# Patient Record
Sex: Male | Born: 1955 | Race: White | Hispanic: No | Marital: Single | Smoking: Never smoker
Health system: Southern US, Community
[De-identification: ages and names within clinical notes are randomized; demographics above are authoritative.]

## PROBLEM LIST (undated history)

## (undated) DIAGNOSIS — T8859XA Other complications of anesthesia, initial encounter: Secondary | ICD-10-CM

## (undated) DIAGNOSIS — N400 Enlarged prostate without lower urinary tract symptoms: Secondary | ICD-10-CM

## (undated) DIAGNOSIS — K635 Polyp of colon: Secondary | ICD-10-CM

## (undated) DIAGNOSIS — N529 Male erectile dysfunction, unspecified: Secondary | ICD-10-CM

## (undated) DIAGNOSIS — T4145XA Adverse effect of unspecified anesthetic, initial encounter: Secondary | ICD-10-CM

## (undated) DIAGNOSIS — M1990 Unspecified osteoarthritis, unspecified site: Secondary | ICD-10-CM

## (undated) DIAGNOSIS — Z87442 Personal history of urinary calculi: Secondary | ICD-10-CM

## (undated) DIAGNOSIS — K219 Gastro-esophageal reflux disease without esophagitis: Secondary | ICD-10-CM

## (undated) DIAGNOSIS — E7800 Pure hypercholesterolemia, unspecified: Secondary | ICD-10-CM

## (undated) DIAGNOSIS — N401 Enlarged prostate with lower urinary tract symptoms: Secondary | ICD-10-CM

## (undated) HISTORY — PX: HERNIA REPAIR: SHX51

## (undated) HISTORY — PX: JOINT REPLACEMENT: SHX530

## (undated) HISTORY — DX: Benign prostatic hyperplasia without lower urinary tract symptoms: N40.0

## (undated) HISTORY — DX: Pure hypercholesterolemia, unspecified: E78.00

## (undated) HISTORY — DX: Polyp of colon: K63.5

## (undated) HISTORY — DX: Male erectile dysfunction, unspecified: N52.9

## (undated) HISTORY — DX: Benign prostatic hyperplasia with lower urinary tract symptoms: N40.1

## (undated) HISTORY — DX: Unspecified osteoarthritis, unspecified site: M19.90

---

## 2005-03-16 ENCOUNTER — Ambulatory Visit: Payer: Self-pay | Admitting: General Practice

## 2007-05-12 HISTORY — PX: SHOULDER SURGERY: SHX246

## 2007-12-10 LAB — HM COLONOSCOPY: HM Colonoscopy: ABNORMAL

## 2008-04-02 ENCOUNTER — Ambulatory Visit: Payer: Self-pay | Admitting: Gastroenterology

## 2008-04-13 ENCOUNTER — Ambulatory Visit: Payer: Self-pay | Admitting: General Practice

## 2008-04-23 ENCOUNTER — Ambulatory Visit: Payer: Self-pay | Admitting: General Practice

## 2008-10-30 ENCOUNTER — Ambulatory Visit: Payer: Self-pay | Admitting: Family Medicine

## 2008-10-30 DIAGNOSIS — E7800 Pure hypercholesterolemia, unspecified: Secondary | ICD-10-CM | POA: Insufficient documentation

## 2008-10-30 DIAGNOSIS — D126 Benign neoplasm of colon, unspecified: Secondary | ICD-10-CM | POA: Insufficient documentation

## 2008-10-30 DIAGNOSIS — M169 Osteoarthritis of hip, unspecified: Secondary | ICD-10-CM

## 2008-10-30 DIAGNOSIS — N200 Calculus of kidney: Secondary | ICD-10-CM | POA: Insufficient documentation

## 2008-10-30 DIAGNOSIS — M1610 Unilateral primary osteoarthritis, unspecified hip: Secondary | ICD-10-CM | POA: Insufficient documentation

## 2008-11-01 ENCOUNTER — Telehealth: Payer: Self-pay | Admitting: Family Medicine

## 2009-01-29 ENCOUNTER — Encounter: Payer: Self-pay | Admitting: Family Medicine

## 2009-02-13 ENCOUNTER — Ambulatory Visit: Payer: Self-pay | Admitting: Family Medicine

## 2009-02-13 DIAGNOSIS — M2170 Unequal limb length (acquired), unspecified site: Secondary | ICD-10-CM | POA: Insufficient documentation

## 2009-02-13 DIAGNOSIS — G4730 Sleep apnea, unspecified: Secondary | ICD-10-CM | POA: Insufficient documentation

## 2009-02-13 DIAGNOSIS — N529 Male erectile dysfunction, unspecified: Secondary | ICD-10-CM

## 2009-02-13 HISTORY — DX: Sleep apnea, unspecified: G47.30

## 2009-02-17 DIAGNOSIS — J309 Allergic rhinitis, unspecified: Secondary | ICD-10-CM | POA: Insufficient documentation

## 2009-02-18 LAB — CONVERTED CEMR LAB
ALT: 31 units/L (ref 0–53)
AST: 26 units/L (ref 0–37)
Alkaline Phosphatase: 59 units/L (ref 39–117)
BUN: 12 mg/dL (ref 6–23)
Basophils Absolute: 0 10*3/uL (ref 0.0–0.1)
Calcium: 9.8 mg/dL (ref 8.4–10.5)
Eosinophils Relative: 1.2 % (ref 0.0–5.0)
GFR calc non Af Amer: 94.05 mL/min (ref >60)
HCT: 47 % (ref 39.0–52.0)
HDL: 41.4 mg/dL (ref >39.00)
Hemoglobin: 16.2 g/dL (ref 13.0–17.0)
LDL Cholesterol: 104 mg/dL — ABNORMAL HIGH (ref 0–99)
Lymphocytes Relative: 30.6 % (ref 12.0–46.0)
Lymphs Abs: 1.4 10*3/uL (ref 0.7–4.0)
Monocytes Relative: 9.8 % (ref 3.0–12.0)
PSA: 0.74 ng/mL (ref 0.10–4.00)
Platelets: 167 10*3/uL (ref 150.0–400.0)
Potassium: 4.5 meq/L (ref 3.5–5.1)
RDW: 12.6 % (ref 11.5–14.6)
Sodium: 141 meq/L (ref 135–145)
Total Bilirubin: 1 mg/dL (ref 0.3–1.2)
VLDL: 12.6 mg/dL (ref 0.0–40.0)
WBC: 4.7 10*3/uL (ref 4.5–10.5)

## 2009-04-19 ENCOUNTER — Ambulatory Visit: Payer: Self-pay | Admitting: Family Medicine

## 2009-04-24 ENCOUNTER — Ambulatory Visit: Payer: Self-pay | Admitting: Family Medicine

## 2009-06-07 ENCOUNTER — Telehealth: Payer: Self-pay | Admitting: Family Medicine

## 2009-06-17 ENCOUNTER — Ambulatory Visit: Payer: Self-pay | Admitting: Family Medicine

## 2009-09-08 HISTORY — PX: TOTAL HIP ARTHROPLASTY: SHX124

## 2009-09-09 ENCOUNTER — Ambulatory Visit: Payer: Self-pay | Admitting: General Practice

## 2009-09-16 ENCOUNTER — Telehealth: Payer: Self-pay | Admitting: Family Medicine

## 2009-09-23 ENCOUNTER — Inpatient Hospital Stay: Payer: Self-pay | Admitting: General Practice

## 2009-10-14 ENCOUNTER — Telehealth: Payer: Self-pay | Admitting: Family Medicine

## 2010-03-12 ENCOUNTER — Ambulatory Visit: Payer: Self-pay | Admitting: Family Medicine

## 2010-03-12 DIAGNOSIS — R5383 Other fatigue: Secondary | ICD-10-CM

## 2010-03-12 DIAGNOSIS — R5381 Other malaise: Secondary | ICD-10-CM

## 2010-03-17 LAB — CONVERTED CEMR LAB
ALT: 28 units/L (ref 0–53)
AST: 26 units/L (ref 0–37)
Albumin: 4.4 g/dL (ref 3.5–5.2)
Alkaline Phosphatase: 61 units/L (ref 39–117)
Basophils Absolute: 0 10*3/uL (ref 0.0–0.1)
Calcium: 9.6 mg/dL (ref 8.4–10.5)
Eosinophils Relative: 1.8 % (ref 0.0–5.0)
GFR calc non Af Amer: 92.48 mL/min (ref >60)
Glucose, Bld: 85 mg/dL (ref 70–99)
HCT: 44.7 % (ref 39.0–52.0)
HDL: 54.1 mg/dL (ref >39.00)
Hemoglobin: 15.3 g/dL (ref 13.0–17.0)
LDL Cholesterol: 114 mg/dL — ABNORMAL HIGH (ref 0–99)
Lymphocytes Relative: 31.2 % (ref 12.0–46.0)
Lymphs Abs: 1.6 10*3/uL (ref 0.7–4.0)
Monocytes Relative: 9.3 % (ref 3.0–12.0)
Neutro Abs: 2.9 10*3/uL (ref 1.4–7.7)
Potassium: 4.7 meq/L (ref 3.5–5.1)
RBC: 4.66 M/uL (ref 4.22–5.81)
RDW: 14.8 % — ABNORMAL HIGH (ref 11.5–14.6)
Sodium: 142 meq/L (ref 135–145)
TSH: 1.23 microintl units/mL (ref 0.35–5.50)
Total CHOL/HDL Ratio: 3
VLDL: 10 mg/dL (ref 0.0–40.0)
WBC: 5.1 10*3/uL (ref 4.5–10.5)

## 2010-06-10 NOTE — Progress Notes (Signed)
Summary: ? UTI  Phone Note Call from Patient   Caller: Patient Call For: Hannah Beat MD Summary of Call: Pt had a total hip replacement last month and since then has felt like he had a UTI- pain and burning  with urination.  He had these same sxs prior to his surgery and was treated with an antibiotic.  He doesnt know if what he is feeling is irritation from his catheter that was used during surgery or from an infection.  Advised him he will need office visit to check, but he said he might try to get in with a urologist.  If not, he will call back for an appt on thursday with either you or Dr. Hetty Ely. Initial call taken by: Lowella Petties CMA,  October 14, 2009 5:11 PM  Follow-up for Phone Call        reasonable POC Follow-up by: Hannah Beat MD,  October 14, 2009 5:33 PM

## 2010-06-10 NOTE — Assessment & Plan Note (Signed)
Summary: CPX/CLE   Vital Signs:  Patient profile:   55 year old male Height:      73 inches Weight:      221.0 pounds BMI:     29.26 Temp:     98.5 degrees F oral Pulse rate:   72 / minute Pulse rhythm:   regular BP sitting:   120 / 84  (left arm) Cuff size:   large  Vitals Entered By: Benny Lennert CMA [facility]) (March 12, 2010 8:49 AM)  History of Present Illness: Chief complaint cpx  55 year old male:  R THA over the summer  3 days a week Hip flexor, hip abductors, light dumbbells, reverse curlse  riding the stationary bike.   doing well.  Preventive Screening-Counseling & Management  Alcohol-Tobacco     Alcohol drinks/day: <1     Alcohol Counseling: not indicated; use of alcohol is not excessive or problematic     Smoking Status: never     Tobacco Counseling: not indicated; no tobacco use  Caffeine-Diet-Exercise     Caffeine use/day: coffee     Caffeine Counseling: not indicated; caffeine use is not excessive or problematic     Diet Comments: Good     Diet Counseling: to improve diet; diet is suboptimal     Does Patient Exercise: yes     Type of exercise: Some aerobic and weights as tolerated with severe OA, exercises many days, but recently none in past few weeks     Exercise (avg: min/session): 30-60     Times/week: 3     Exercise Counseling: to improve exercise regimen  Hep-HIV-STD-Contraception     Hepatitis Risk: no risk noted     HIV Risk: no risk noted     STD Risk: no risk noted     Contraception Counseling: not indicated; no questions/concerns expressed     Dental Visit-last 6 months yes     Dental Care Counseling: not indicated; dental care within six months     Testicular SE Education/Counseling to perform regular STE  Safety-Violence-Falls     Seat Belt Use: yes     Fall Risk Counseling: not indicated; no significant falls noted  Clinical Review Panels:  Prevention   Last Colonoscopy:  abnormal (12/10/2007)   Last PSA:  0.74  (02/13/2009)  Immunizations   Last Tetanus Booster:  given (02/13/2009)   Last Flu Vaccine:  given (02/08/2009)  Lipid Management   Cholesterol:  158 (02/13/2009)   LDL (bad choesterol):  104 (02/13/2009)   HDL (good cholesterol):  41.40 (02/13/2009)  Diabetes Management   Creatinine:  0.9 (02/13/2009)   Last Flu Vaccine:  given (02/08/2009)  CBC   WBC:  4.7 (02/13/2009)   RBC:  4.92 (02/13/2009)   Hgb:  16.2 (02/13/2009)   Hct:  47.0 (02/13/2009)   Platelets:  167.0 (02/13/2009)   MCV  95.4 (02/13/2009)   MCHC  34.4 (02/13/2009)   RDW  12.6 (02/13/2009)   PMN:  58.1 (02/13/2009)   Lymphs:  30.6 (02/13/2009)   Monos:  9.8 (02/13/2009)   Eosinophils:  1.2 (02/13/2009)   Basophil:  0.3 (02/13/2009)  Complete Metabolic Panel   Glucose:  91 (02/13/2009)   Sodium:  141 (02/13/2009)   Potassium:  4.5 (02/13/2009)   Chloride:  102 (02/13/2009)   CO2:  30 (02/13/2009)   BUN:  12 (02/13/2009)   Creatinine:  0.9 (02/13/2009)   Albumin:  4.3 (02/13/2009)   Total Protein:  7.6 (02/13/2009)   Calcium:  9.8 (02/13/2009)  Total Bili:  1.0 (02/13/2009)   Alk Phos:  59 (02/13/2009)   SGPT (ALT):  31 (02/13/2009)   SGOT (AST):  26 (02/13/2009)   Allergies (verified): No Known Drug Allergies  Past History:  Past medical, surgical, family and social histories (including risk factors) reviewed, and no changes noted (except as noted below).  Past Medical History: Reviewed history from 02/13/2009 and no changes required. 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 Family History of Prostate CA 1st degree relative (Father)  Mother alive, healthy Sister, migraines  Social  History: Reviewed history from 10/30/2008 and no changes required. Occupation: [employer], Emergency planning/management officer, Runner, broadcasting/film/video, Bachelor Never Smoked Alcohol use-yes Drug use-no Regular exercise-yes  Review of Systems  General: Denies fever, chills, sweats, anorexia, fatigue, weakness, malaise Eyes: Denies blurring, vision loss ENT: Denies earache, nasal congestion, nosebleeds, sore throat, and hoarseness.  Cardiovascular: Denies chest pains, palpitations, syncope, dyspnea on exertion,  Respiratory: Denies cough, dyspnea at rest, excessive sputum,wheeezing GI: Denies nausea, vomiting, diarrhea, constipation, change in bowel habits, abdominal pain, melena, hematochezia GU: OCC INCREASED URINATION Musculoskeletal: Denies back pain, joint pain Derm: Denies rash, itching Neuro: Denies  paresthesias, frequent falls, frequent headaches, and difficulty walking.  Psych: HAD SOME DEP POST HIP REPLACEMENT, now resolved Endocrine: Denies cold intolerance, heat intolerance, polydipsia, polyphagia, polyuria, and unusual weight change.  Heme: Denies enlarged lymph nodes Allergy: No hayfever   Otherwise, the pertinent positives and negatives are listed above and in the HPI, otherwise a full review of systems has been reviewed and is negative unless noted positive.    Impression & Recommendations:  Problem # 1:  HEALTH MAINTENANCE EXAM (ICD-V70.0) The patient's preventative maintenance and recommended screening tests for an annual wellness exam were reviewed in full today. Brought up to date unless services declined.  Counselled on the importance of diet, exercise, and its role in overall health and mortality. The patient's FH and SH was reviewed, including their home life, tobacco status, and drug and alcohol status.   doing well and up to date has had flu shot encourage exercise  Complete Medication List: 1)  Fish Oil 1000 Mg Caps (Omega-3 fatty acids) .... 2 tabs by mouth daily 2)   Vitamin C Cr 500 Mg Cr-caps (Ascorbic acid) .... 2 tabs by mouth daily 3)  Super B Complex Tabs (B complex-c) .... Two tabs a day 4)  Multivitamins Tabs (Multiple vitamin) .... Two tabs a day 5)  Vitamin D 1000 Unit Tabs (Cholecalciferol) .... Two tabs a day 6)  Saw Palmetto Supplement  .Marland KitchenMarland Kitchen. 1 by mouth two times a day 7)  Aspirin 81 Mg Tbec (Aspirin) .Marland Kitchen.. 1 by mouth qday 8)  Simvastatin 40 Mg Tabs (Simvastatin) .Marland Kitchen.. 1 by mouth at bedtime 9)  Red Wine Extract Plus Caps (Misc natural products) .... One a day 10)  Fluticasone Propionate 50 Mcg/act Susp (Fluticasone propionate) .... 2 sprays each nostril once daily 11)  Fluocinonide 0.05 % Crea (Fluocinonide) .... Apply as directed 12)  Levitra 20 Mg Tabs (Vardenafil hcl) .Marland Kitchen.. 1 by mouth prior to intercourse 13)  Glucosamine-chondroitin Caps (Glucosamine-chondroit-vit c-mn) .... Two caps by mouth daily 14)  Melatonin Pt Not Sure of Mg  .... One at bedtime as needed  Other Orders: Venipuncture (16109) TLB-Lipid Panel (80061-LIPID)  TLB-BMP (Basic Metabolic Panel-BMET) (80048-METABOL) TLB-CBC Platelet - w/Differential (85025-CBCD) TLB-Hepatic/Liver Function Pnl (80076-HEPATIC) TLB-TSH (Thyroid Stimulating Hormone) (84443-TSH) TLB-PSA (Prostate Specific Antigen) (84153-PSA) Prescriptions: FLUOCINONIDE 0.05 % CREA (FLUOCINONIDE) Apply as directed  #60 grams x 3   Entered and Authorized by:   Hannah Beat MD   Signed by:   Hannah Beat MD on 03/12/2010   Method used:   Print then Give to Patient   RxID:   [id number] LEVITRA 20 MG TABS (VARDENAFIL HCL) 1 by mouth prior to intercourse  #10 x 11   Entered and Authorized by:   Hannah Beat MD   Signed by:   Hannah Beat MD on 03/12/2010   Method used:   Print then Give to Patient   RxID:   [id number] FLUTICASONE PROPIONATE 50 MCG/ACT  SUSP (FLUTICASONE PROPIONATE) 2 sprays each nostril once daily  #1 vial x 11   Entered and Authorized by:   Hannah Beat MD   Signed by:    Hannah Beat MD on 03/12/2010   Method used:   Print then Give to Patient   RxID:   [id number] SIMVASTATIN 40 MG TABS (SIMVASTATIN) 1 by mouth at bedtime  #30 x 11   Entered and Authorized by:   Hannah Beat MD   Signed by:   Hannah Beat MD on 03/12/2010   Method used:   Print then Give to Patient   RxID:   [id number]    Orders Added: 1)  Venipuncture [09326] 2)  TLB-Lipid Panel [80061-LIPID] 3)  TLB-BMP (Basic Metabolic Panel-BMET) [80048-METABOL] 4)  TLB-CBC Platelet - w/Differential [85025-CBCD] 5)  TLB-Hepatic/Liver Function Pnl [80076-HEPATIC] 6)  TLB-TSH (Thyroid Stimulating Hormone) [84443-TSH] 7)  TLB-PSA (Prostate Specific Antigen) [84153-PSA] 8)  Est. Patient 40-64 years [71245]    Current Allergies (reviewed today): No known allergies   Physical Exam General Appearance: well developed, well nourished, no acute distress Eyes: conjunctiva and lids normal, PERRLA, EOMI Ears, Nose, Mouth, Throat: TM clear, nares clear, oral exam WNL Neck: supple, no lymphadenopathy, no thyromegaly, no JVD Respiratory: clear to auscultation and percussion, respiratory effort normal Cardiovascular: regular rate and rhythm, S1-S2, no murmur, rub or gallop, no bruits, peripheral pulses normal and symmetric, no cyanosis, clubbing, edema or varicosities Chest: no scars, masses, tenderness; no asymmetry, skin changes, nipple discharge, no gynecomastia   Gastrointestinal: soft, non-tender; no hepatosplenomegaly, masses; active bowel sounds all quadrants, no masses, tenderness, hemorrhoids  Genitourinary: no hernia, testicular mass, penile discharge, priapism or prostate enlargement Lymphatic: no cervical, axillary or inguinal adenopathy Musculoskeletal: gait normal, muscle tone and strength WNL, no joint swelling, effusions, discoloration, crepitus  Skin: clear, good turgor, color WNL, no rashes, lesions, or ulcerations Neurologic: normal mental status, normal reflexes,  normal strength, sensation, and motion Psychiatric: alert; oriented to person, place and time Other Exam:

## 2010-06-10 NOTE — Progress Notes (Signed)
Summary: regarding surgical clearance  Phone Note Call from Patient Call back at Work Phone [phone]   Caller: Patient Call For: Hannah Beat MD Summary of Call: Pt will be having hip replacement surgery in the summer and he says he will need a letter of surgical clearance.  I advised him he will probably need an office visit to get an EKG, but I told him I would ask you. Initial call taken by: Lowella Petties CMA,  June 07, 2009 4:22 PM  Follow-up for Phone Call        Yes, will need a 30 minute preoperative clearance consult office visit. I would schedule in spring if hip replacement not until summer.  please assist in setting. Follow-up by: Hannah Beat MD,  June 08, 2009 5:05 PM  Additional Follow-up for Phone Call Additional follow up Details #1::        Left message for patient to call back. Lewanda Rife LPN  June 12, 2009 9:53 AM   Spoke with patient, he wants to come in sooner for his surgery clearence appt, spoke with Dr. Patsy Lager and he said it was ok for him to come in sooner, says patient is a busy man.  Scheduled him for 06/17/2009 at 3:30pm.  Additional Follow-up by: Linde Gillis CMA [facility]),  June 13, 2009 11:10 AM

## 2010-06-10 NOTE — Assessment & Plan Note (Signed)
Summary: 30 minute appt, surgery clearence/nt   Vital Signs:  Patient profile:   55 year old male Weight:      217.50 pounds BMI:     28.80 Temp:     98.3 degrees F oral Pulse rate:   72 / minute Pulse rhythm:   regular BP sitting:   120 / 86  (right arm) Cuff size:   large  Vitals Entered By: Linde Gillis CMA [facility]) (June 17, 2009 3:31 PM) CC: 30 minute exam, surgery clearance   History of Present Illness: 55 year old male seen in consultation for Dr. Milinda Antis for medical clearance for R THR.  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 He is able to achieve a 4 met equivalent without any difficulty from a CP or SOB standpoint and his limiting factor for exercise is his hip pain.  He feels well and had a normal CPX and labwork in the fall.  He is scheduled for a THR on 09/23/2009.     Clinical Review Panels:  Prevention   Last Colonoscopy:  abnormal (12/10/2007)   Last PSA:  0.74 (02/13/2009)  Immunizations   Last Tetanus Booster:  given (02/13/2009)   Last Flu Vaccine:  given (02/08/2009)  Lipid Management   Cholesterol:  158 (02/13/2009)   LDL (bad choesterol):  104 (02/13/2009)   HDL (good cholesterol):  41.40 (02/13/2009)  Diabetes Management   Creatinine:  0.9 (02/13/2009)   Last Flu Vaccine:  given (02/08/2009)  CBC   WBC:  4.7 (02/13/2009)   RBC:  4.92 (02/13/2009)   Hgb:  16.2 (02/13/2009)   Hct:  47.0 (02/13/2009)   Platelets:  167.0 (02/13/2009)   MCV  95.4 (02/13/2009)   MCHC  34.4 (02/13/2009)   RDW  12.6 (02/13/2009)   PMN:  58.1 (02/13/2009)   Lymphs:  30.6 (02/13/2009)   Monos:  9.8 (02/13/2009)   Eosinophils:  1.2 (02/13/2009)   Basophil:  0.3 (02/13/2009)  Complete Metabolic Panel   Glucose:  91 (02/13/2009)   Sodium:  141 (02/13/2009)   Potassium:  4.5 (02/13/2009)   Chloride:  102  (02/13/2009)   CO2:  30 (02/13/2009)   BUN:  12 (02/13/2009)   Creatinine:  0.9 (02/13/2009)   Albumin:  4.3 (02/13/2009)   Total Protein:  7.6 (02/13/2009)   Calcium:  9.8 (02/13/2009)   Total Bili:  1.0 (02/13/2009)   Alk Phos:  59 (02/13/2009)   SGPT (ALT):  31 (02/13/2009)   SGOT (AST):  26 (02/13/2009)   Current Problems (verified): 1)  Pre-operative Cardiovascular Examination  (ICD-V72.81) 2)  Unequal Leg Length  (ICD-736.81) 3)  Erectile Dysfunction, Organic  (ICD-607.84) 4)  Sleep Apnea  (ICD-780.57) 5)  Health Maintenance Exam  (ICD-V70.0) 6)  Allergic Rhinitis  (ICD-477.9) 7)  Encounter For Long-term Use of Other Medications  (ICD-V58.69) 8)  Special Screening Malignant Neoplasm of Prostate  (ICD-V76.44) 9)  Degenerative Joint Disease, Hips  (ICD-715.95) 10)  Colonic Polyps  (ICD-211.3) 11)  Nephrolithiasis  (ICD-592.0) 12)  Hypercholesterolemia  (ICD-272.0)  Allergies (verified): No Known Drug Allergies  Past History:  Past medical, surgical, family and social histories (including risk factors) reviewed, and no changes noted (except as noted below).  Past Medical History: Reviewed history from 02/13/2009 and no changes required. COLONIC POLYPS  NEPHROLITHIASIS HYPERCHOLESTEROLEMIA ARTHRITIS, severe R > L hip Erectile Dysfunction Allergic rhinitis Moderately enlarged prostate  Past Surgical History: Reviewed history from 10/30/2008 and no changes required. L shoulder, open, 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, 2009 Milinda Antis)  Family History: Reviewed history from 10/30/2008 and no changes required. Family History of Prostate CA 1st degree relative (Father)  Mother alive, healthy Sister, migraines  Social History: Reviewed history from 10/30/2008 and no changes required. 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  General:  Denies chills, fatigue, and fever. CV:  Denies chest pain or discomfort, difficulty breathing at night, difficulty breathing while lying down, palpitations, shortness of breath with exertion, and swelling of feet. Resp:  Denies cough and shortness of breath. MS:  Complains of joint pain, muscle weakness, and stiffness. Neuro:  Denies tingling.  Physical Exam  General:  Well-developed,well-nourished,in no acute distress; alert,appropriate and cooperative throughout examination Head:  Normocephalic and atraumatic without obvious abnormalities. No apparent alopecia or balding. Eyes:  vision grossly intact.   Ears:  no external deformities.   Nose:  no external deformity.   Mouth:  Oral mucosa and oropharynx without lesions or exudates.  Teeth in good repair. Neck:  No deformities, masses, or tenderness noted. Lungs:  Normal respiratory effort, chest expands symmetrically. Lungs are clear to auscultation, no crackles or wheezes. Heart:  Normal rate and regular rhythm. S1 and S2 normal without gallop, murmur, click, rub or other extra sounds. Extremities:  no c/c/e Neurologic:  alert & oriented X3.   Cervical Nodes:  No lymphadenopathy noted Psych:  Cognition and judgment appear intact. Alert and cooperative with normal attention span and concentration. No apparent delusions, illusions, hallucinations   Impression & Recommendations:  Problem # 1:  PRE-OPERATIVE CARDIOVASCULAR EXAMINATION (ICD-V72.81) Following AHA / ACC criteria, patient does not need further cardiac work-up prior to [facility]  EKG: Normal sinus rhythm. Normal axis, normal R wave progression, No acute ST elevation or depression. t wave inv avf, nonspecific and grossly normal ekg  All most relevant labs are included. If further laboratories needed for anesthesia  directly before surgery, they can easily be obtained the month prior to surgery.  Given overall current limitations with severe hip OA, benefits outweigh risks of surgery, and proceeding with TKR is reasonable.  cc: Dr. Milinda Antis  Problem # 2:  HYPERCHOLESTEROLEMIA (ICD-272.0)  His updated medication list for this problem includes:    Simvastatin 40 Mg Tabs (Simvastatin) .Marland Kitchen... 1 by mouth at bedtime  Problem # 3:  SLEEP APNEA (ICD-780.57)  Complete Medication List: 1)  Fish Oil 1000 Mg Caps (Omega-3 fatty acids) .... 2 tabs by mouth daily 2)  Vitamin C Cr 500 Mg Cr-caps (Ascorbic acid) .... 2 tabs by mouth daily 3)  Super B Complex Tabs (B complex-c) .... Two tabs a day 4)  Multivitamins Tabs (Multiple vitamin) .... Two tabs a day 5)  Vitamin D 1000 Unit Tabs (Cholecalciferol) .... Two tabs a day 6)  Saw Palmetto Supplement  .Marland KitchenMarland Kitchen. 1 by mouth two times a day 7)  Aspirin 81 Mg Tbec (Aspirin) .Marland Kitchen.. 1 by mouth qday 8)  Simvastatin 40 Mg Tabs (Simvastatin) .Marland Kitchen.. 1 by mouth at bedtime 9)  Red Wine Extract Plus Caps (Misc natural products) .... One a day 10)  Fluticasone Propionate 50  Mcg/act Susp (Fluticasone propionate) .... 2 sprays each nostril once daily 11)  Fluocinonide 0.05 % Crea (Fluocinonide) .... Apply as directed 12)  Levitra 20 Mg Tabs (Vardenafil hcl) .Marland Kitchen.. 1 by mouth prior to intercourse 13)  Glucosamine-chondroitin Caps (Glucosamine-chondroit-vit c-mn) .... Two caps by mouth daily 14)  Melatonin Pt Not Sure of Mg  .... One at bedtime as needed  Current Allergies (reviewed today): No known allergies

## 2010-06-10 NOTE — Progress Notes (Signed)
Summary: UTI  Phone Note Call from Patient Call back at Home Phone [phone] Call back at [phone]   Caller: Patient Call For: Hannah Beat MD Summary of Call: Patient called to schedule appt. None of the providers have anything available for today. He says that he has a UTI. He is having burning while urinating and frequency. He is going to bathroom every 45 min, but can't empty out. He is having him replacement on the 16th and needs this resolved before then. He wants to know if he can have something called in to [facility] on [address] in [place]. Please advise.  Initial call taken by: Melody Comas,  Sep 16, 2009 11:52 AM  Follow-up for Phone Call        reasonable -- we have no space Follow-up by: Hannah Beat MD,  Sep 16, 2009 12:20 PM    New/Updated Medications: CIPROFLOXACIN HCL 500 MG TABS (CIPROFLOXACIN HCL) 1 by mouth two times a day Prescriptions: CIPROFLOXACIN HCL 500 MG TABS (CIPROFLOXACIN HCL) 1 by mouth two times a day  #14 x 0   Entered by:   Benny Lennert CMA (AAMA)   Authorized by:   Hannah Beat MD   Signed by:   Benny Lennert CMA (AAMA) on 09/16/2009   Method used:   Electronically to        [facility] [address] [phone].* (retail)       [address]       [place], [place]  [address]       Ph: [phone]       Fax: [phone]   RxID:   [id number] CIPROFLOXACIN HCL 500 MG TABS (CIPROFLOXACIN HCL) 1 by mouth two times a day  #14 x 0   Entered and Authorized by:   Hannah Beat MD   Signed by:   Hannah Beat MD on 09/16/2009   Method used:   Telephoned to ...         RxID:   [id number]

## 2010-06-17 ENCOUNTER — Encounter: Payer: Self-pay | Admitting: Family Medicine

## 2010-06-17 ENCOUNTER — Ambulatory Visit (INDEPENDENT_AMBULATORY_CARE_PROVIDER_SITE_OTHER): Payer: BC Managed Care – PPO | Admitting: Family Medicine

## 2010-06-17 DIAGNOSIS — J069 Acute upper respiratory infection, unspecified: Secondary | ICD-10-CM

## 2010-06-18 ENCOUNTER — Ambulatory Visit: Payer: Self-pay | Admitting: Family Medicine

## 2010-06-26 NOTE — Assessment & Plan Note (Signed)
Summary: SINUS INFECTION   Vital Signs:  Patient profile:   55 year old male Weight:      227 pounds O2 Sat:      97 % on Room air Temp:     98.5 degrees F oral Pulse rate:   74 / minute Pulse rhythm:   regular BP sitting:   112 / 80  (left arm) Cuff size:   large  Vitals Entered By: Selena Batten Dance CMA (AAMA) (June 17, 2010 12:30 PM)  O2 Flow:  Room air CC: ? Sinus infection   History of Present Illness: CC: sinus infx?  6d h/o feeling ill.  Started with stomach upset, weakness.  felt that way all weekend, stayed in bed.  + nausea.  Also with chills all weekend.  2 d ago started feeling head cold, feels like going into chest.  + coughing all night last night.  Feels lungs tight as well.  Not very much discharge from nose.  + achey all over.  + HA - bitemporal dull.  used cough syrup last night which didn't help.  + SOB  No abd pain, v/d, rashes, myalgias, arthralgias, ST.  No ear pain or tooth pain.  No CP.  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  no smokers at home.    No h/o asthma/allergies.  not using flonase as should.  Current Medications (verified): 1)  Fish Oil 1000 Mg Caps (Omega-3 Fatty Acids) .... 2 Tabs By Mouth Daily 2)  Vitamin C Cr 500 Mg Cr-Caps (Ascorbic Acid) .... 2 Tabs By Mouth Daily 3)  Super B Complex  Tabs (B Complex-C) .... Two Tabs A Day 4)  Multivitamins  Tabs (Multiple Vitamin) .... Two Tabs A Day 5)  Vitamin D 1000 Unit Tabs (Cholecalciferol) .... Two Tabs A Day 6)  Saw Palmetto Supplement .Marland Kitchen.. 1 By Mouth Two Times A Day 7)  Aspirin 81 Mg Tbec (Aspirin) .Marland Kitchen.. 1 By Mouth Qday 8)  Simvastatin 40 Mg Tabs (Simvastatin) .Marland Kitchen.. 1 By Mouth At Bedtime 9)  Red Wine Extract Plus  Caps (Misc Natural Products) .... One A Day 10)  Fluticasone Propionate 50 Mcg/act  Susp (Fluticasone Propionate) .... 2 Sprays Each Nostril Once Daily 11)  Fluocinonide 0.05 % Crea (Fluocinonide) .... Apply As Directed 12)  Levitra 20 Mg Tabs (Vardenafil Hcl) .Marland Kitchen.. 1  By Mouth Prior To Intercourse 13)  Glucosamine-Chondroitin  Caps (Glucosamine-Chondroit-Vit C-Mn) .... Two Caps By Mouth Daily 14)  Melatonin Pt Not Sure of Mg .... One At Bedtime As Needed  Allergies (verified): No Known Drug Allergies  Past History:  Past Medical History: Last updated: 02/13/2009 COLONIC POLYPS  NEPHROLITHIASIS HYPERCHOLESTEROLEMIA ARTHRITIS, severe R > L hip Erectile Dysfunction Allergic rhinitis Moderately enlarged prostate  Social History: Last updated: 10/30/2008 Occupation: [employer], Emergency planning/management officer, head  Doctorate Single, Bachelor Never Smoked Alcohol use-yes Drug use-no Regular exercise-yes  Review of Systems       per HPI  Physical Exam  General:  Well-developed,well-nourished,in no acute distress; alert,appropriate and cooperative throughout examination Head:  Normocephalic and atraumatic without obvious abnormalities. No apparent alopecia or balding.  no sinus tenderness Eyes:  vision grossly intact.   Ears:  TMs clear bilaterally Nose:  nares clear Mouth:  Oral mucosa and oropharynx without lesions or exudates.  Teeth in good repair. Neck:  No deformities, masses, or tenderness noted.  no LAD Lungs:  Normal respiratory effort, chest expands symmetrically. Lungs are clear to auscultation, no crackles or wheezes. Heart:  Normal rate  and regular rhythm. S1 and S2 normal without gallop, murmur, click, rub or other extra sounds. Pulses:  2+ rad pulses, brisk cap refill Extremities:  no pedal edema Skin:  Intact without suspicious lesions or rashes   Impression & Recommendations:  Problem # 1:  VIRAL URI (ICD-465.9) supportive care as per instructions.  if not better in next few days consider zpack for bronchitis picture.  if worse, return for further evaluation.  His updated medication list for this problem includes:    Aspirin 81 Mg Tbec (Aspirin) .Marland Kitchen... 1 by mouth qday    Tussionex Pennkinetic Er 10-8 Mg/77ml Lqcr (Hydrocod  polst-chlorphen polst) ..... One teaspoon two times a day as needed cough, sedation precautions  Complete Medication List: 1)  Fish Oil 1000 Mg Caps (Omega-3 fatty acids) .... 2 tabs by mouth daily 2)  Vitamin C Cr 500 Mg Cr-caps (Ascorbic acid) .... 2 tabs by mouth daily 3)  Super B Complex Tabs (B complex-c) .... Two tabs a day 4)  Multivitamins Tabs (Multiple vitamin) .... Two tabs a day 5)  Vitamin D 1000 Unit Tabs (Cholecalciferol) .... Two tabs a day 6)  Saw Palmetto Supplement  .Marland KitchenMarland Kitchen. 1 by mouth two times a day 7)  Aspirin 81 Mg Tbec (Aspirin) .Marland Kitchen.. 1 by mouth qday 8)  Simvastatin 40 Mg Tabs (Simvastatin) .Marland Kitchen.. 1 by mouth at bedtime 9)  Red Wine Extract Plus Caps (Misc natural products) .... One a day 10)  Fluticasone Propionate 50 Mcg/act Susp (Fluticasone propionate) .... 2 sprays each nostril once daily 11)  Fluocinonide 0.05 % Crea (Fluocinonide) .... Apply as directed 12)  Levitra 20 Mg Tabs (Vardenafil hcl) .Marland Kitchen.. 1 by mouth prior to intercourse 13)  Glucosamine-chondroitin Caps (Glucosamine-chondroit-vit c-mn) .... Two caps by mouth daily 14)  Melatonin Pt Not Sure of Mg  .... One at bedtime as needed 15)  Tussionex Pennkinetic Er 10-8 Mg/24ml Lqcr (Hydrocod polst-chlorphen polst) .... One teaspoon two times a day as needed cough, sedation precautions  Patient Instructions: 1)  Sounds like you have a viral upper respiratory infection. 2)  Antibiotics are not needed for this.  Viral infections usually take 7-10 days to resolve.  The cough can last 4 weeks to go away. 3)  Use medication as prescribed: tussionex at night 4)  Push fluids and plenty of rest over next few days. 5)  Guaifenesin 400mg  IR 1 1/2 pills in am and at noon (or simple mucinex) with plenty of fluid to mobilize mucous. 6)  Please return if you are not improving as expected, or if you have high fevers (>101.5) or difficulty swallowing. 7)  Call us towards end of week if not feeling better. 8)  Call clinic with  questions.  Pleasure to see you today. Prescriptions: Sandria Senter ER 10-8 MG/5ML LQCR (HYDROCOD POLST-CHLORPHEN POLST) one teaspoon two times a day as needed cough, sedation precautions  #240cc x 0   Entered and Authorized by:   Eustaquio Boyden  MD   Signed by:   Eustaquio Boyden  MD on 06/17/2010   Method used:   Print then Give to Patient   RxID:   [id number]    Orders Added: 1)  Est. Patient Level III [14782]     Current Allergies (reviewed today): No known allergies

## 2010-08-05 ENCOUNTER — Encounter: Payer: Self-pay | Admitting: Family Medicine

## 2011-03-16 ENCOUNTER — Other Ambulatory Visit: Payer: Self-pay | Admitting: *Deleted

## 2011-03-16 MED ORDER — FLUTICASONE PROPIONATE 50 MCG/ACT NA SUSP: 2.0000 | Freq: Every day | NASAL | Status: DC

## 2011-03-18 ENCOUNTER — Ambulatory Visit (INDEPENDENT_AMBULATORY_CARE_PROVIDER_SITE_OTHER): Payer: BC Managed Care – PPO | Admitting: Family Medicine

## 2011-03-18 ENCOUNTER — Encounter: Payer: Self-pay | Admitting: Family Medicine

## 2011-03-18 VITALS — BP 116/80 | HR 72 | Temp 98.6°F | Ht 73.25 in | Wt 216.0 lb

## 2011-03-18 DIAGNOSIS — Z79899 Other long term (current) drug therapy: Secondary | ICD-10-CM

## 2011-03-18 DIAGNOSIS — Z0000 Encounter for general adult medical examination without abnormal findings: Secondary | ICD-10-CM

## 2011-03-18 DIAGNOSIS — E7800 Pure hypercholesterolemia, unspecified: Secondary | ICD-10-CM

## 2011-03-18 LAB — CBC WITH DIFFERENTIAL/PLATELET
Basophils Absolute: 0 10*3/uL (ref 0.0–0.1)
Basophils Relative: 0.4 % (ref 0.0–3.0)
Eosinophils Absolute: 0.1 10*3/uL (ref 0.0–0.7)
Lymphocytes Relative: 32.5 % (ref 12.0–46.0)
MCHC: 34.2 g/dL (ref 30.0–36.0)
MCV: 96.9 fl (ref 78.0–100.0)
Monocytes Absolute: 0.4 10*3/uL (ref 0.1–1.0)
Neutrophils Relative %: 57.3 % (ref 43.0–77.0)
Platelets: 167 10*3/uL (ref 150.0–400.0)
RDW: 13.5 % (ref 11.5–14.6)

## 2011-03-18 LAB — LIPID PANEL
HDL: 52.7 mg/dL (ref >39.00)
Total CHOL/HDL Ratio: 3
Triglycerides: 39 mg/dL (ref 0.0–149.0)
VLDL: 7.8 mg/dL (ref 0.0–40.0)

## 2011-03-18 LAB — BASIC METABOLIC PANEL
BUN: 12 mg/dL (ref 6–23)
CO2: 30 mEq/L (ref 19–32)
Calcium: 9.5 mg/dL (ref 8.4–10.5)
Chloride: 104 mEq/L (ref 96–112)
Creatinine, Ser: 1 mg/dL (ref 0.4–1.5)
Glucose, Bld: 91 mg/dL (ref 70–99)

## 2011-03-18 LAB — HEPATIC FUNCTION PANEL
Bilirubin, Direct: 0.1 mg/dL (ref 0.0–0.3)
Total Bilirubin: 0.8 mg/dL (ref 0.3–1.2)
Total Protein: 7.2 g/dL (ref 6.0–8.3)

## 2011-03-18 NOTE — Progress Notes (Signed)
  Subjective:    Patient ID: Joshua Lang, male    DOB: 03-28-1956, 54 y.o.   MRN: [id number]  HPI  Joshua Lang, a 55 y.o. male presents today in the office for the following:    CPX:  Having some prostatitis Had a hip replacement about a year ago or so. Had some depression afterwards.   Has been to Dr. Artis Flock for a couple of months Fish Oil and Saw Palmeto Questions about TURP. Prostate BPH management.   Cranberry juice.  Some soreness in your left hip.  Had a flu shot  Preventative Health Maintenance Visit:  Health Maintenance Summary Reviewed and updated, unless pt declines services.  Tobacco History Reviewed. Alcohol: No concerns, no excessive use Exercise Habits: Some activity, rec at least 30 mins 5 times a week - rare right now STD concerns: no risk or activity to increase risk Drug Use: None Encouraged self-testicular check  Health Maintenance  Topic Date Due  . Influenza Vaccine  02/09/2011  . Colonoscopy  12/09/2017  . Tetanus/tdap  02/14/2019    Review of Systems General: Denies fever, chills, sweats. No significant weight loss. Eyes: Denies blurring,significant itching ENT: Denies earache, sore throat, and hoarseness. Cardiovascular: Denies chest pains, palpitations, dyspnea on exertion Respiratory: Denies cough, dyspnea at rest,wheeezing Breast: no concerns about lumps GI: Denies nausea, vomiting, diarrhea, constipation, change in bowel habits, abdominal pain, melena, hematochezia GU: above Musculoskeletal: L HIP Pain Derm: Denies rash, itching Neuro: Denies  paresthesias, frequent falls, frequent headaches Psych: Denies depression, anxiety Endocrine: Denies cold intolerance, heat intolerance, polydipsia Heme: Denies enlarged lymph nodes Allergy: No hayfever      Objective:   Physical Exam   Physical Exam  Blood pressure 116/80, pulse 72, temperature 98.6 F (37 C), temperature source Oral, height 6' 1.25" (1.861 m), weight 216 lb (97.977  kg).  PE: GEN: well developed, well nourished, no acute distress Eyes: conjunctiva and lids normal, PERRLA, EOMI ENT: TM clear, nares clear, oral exam WNL Neck: supple, no lymphadenopathy, no thyromegaly, no JVD Pulm: clear to auscultation and percussion, respiratory effort normal CV: regular rate and rhythm, S1-S2, no murmur, rub or gallop, no bruits, peripheral pulses normal and symmetric, no cyanosis, clubbing, edema or varicosities Chest: no scars, masses, no gynecomastia   GI: soft, non-tender; no hepatosplenomegaly, masses; active bowel sounds all quadrants GU: no hernia, testicular mass, penile discharge. Defer checking prostate since done 1 month ago at Urology Lymph: no cervical, axillary or inguinal adenopathy MSK: gait normal, muscle tone and strength WNL, no joint swelling, effusions, discoloration, crepitus  SKIN: clear, good turgor, color WNL, no rashes, lesions, or ulcerations Neuro: normal mental status, normal strength, sensation, and motion Psych: alert; oriented to person, place and time, normally interactive and not anxious or depressed in appearance.       Assessment & Plan:   1. Routine general medical examination at a health care facility    2. HYPERCHOLESTEROLEMIA  Lipid panel  3. Encounter for long-term (current) use of other medications  Basic metabolic panel, CBC with Differential, Hepatic function panel    The patient's preventative maintenance and recommended screening tests for an annual wellness exam were reviewed in full today. Brought up to date unless services declined.  Counselled on the importance of diet, exercise, and its role in overall health and mortality. The patient's FH and SH was reviewed, including their home life, tobacco status, and drug and alcohol status.

## 2011-03-18 NOTE — Patient Instructions (Signed)
[facility]: Dr. Irineo Axon [facility]: Dr. Crecencio Mc or [place] McDiarmid

## 2011-03-19 ENCOUNTER — Other Ambulatory Visit: Payer: Self-pay | Admitting: *Deleted

## 2011-03-19 MED ORDER — SOLIFENACIN SUCCINATE 5 MG PO TABS: 5.0000 mg | ORAL_TABLET | Freq: Every day | ORAL | Status: DC

## 2011-03-19 MED ORDER — VARDENAFIL HCL 20 MG PO TABS: 20.0000 mg | ORAL_TABLET | Freq: Every day | ORAL | Status: DC | PRN

## 2011-03-19 MED ORDER — SIMVASTATIN 40 MG PO TABS: 40.0000 mg | ORAL_TABLET | Freq: Every day | ORAL | Status: DC

## 2011-03-20 ENCOUNTER — Telehealth: Payer: Self-pay | Admitting: Internal Medicine

## 2011-03-20 NOTE — Telephone Encounter (Signed)
Patient called and wanted to know if Dr. Patsy Lager received a fax from Dr. Amada Jupiter office.  He stated the office has faxed it over a few times.  Please let patient know if Dr. Patsy Lager has this fax.

## 2011-03-23 NOTE — Telephone Encounter (Signed)
I did. Dr. Artis Flock sent over his consult notes.  Let him know we got them

## 2011-03-24 NOTE — Telephone Encounter (Signed)
Patient advised.

## 2011-09-11 ENCOUNTER — Other Ambulatory Visit: Payer: Self-pay | Admitting: *Deleted

## 2011-09-11 MED ORDER — FLUOCINONIDE 0.05 % EX CREA: 1.0000 "application " | TOPICAL_CREAM | CUTANEOUS | Status: DC

## 2012-01-19 ENCOUNTER — Ambulatory Visit (INDEPENDENT_AMBULATORY_CARE_PROVIDER_SITE_OTHER): Payer: BC Managed Care – PPO | Admitting: Family Medicine

## 2012-01-19 ENCOUNTER — Encounter: Payer: Self-pay | Admitting: Family Medicine

## 2012-01-19 VITALS — BP 122/82 | HR 65 | Temp 97.9°F | Resp 15 | Wt 225.5 lb

## 2012-01-19 DIAGNOSIS — J069 Acute upper respiratory infection, unspecified: Secondary | ICD-10-CM | POA: Insufficient documentation

## 2012-01-19 MED ORDER — GUAIFENESIN-CODEINE 100-10 MG/5ML PO SYRP: 5.0000 mL | ORAL_SOLUTION | Freq: Every evening | ORAL | Status: DC | PRN

## 2012-01-19 MED ORDER — AZITHROMYCIN 250 MG PO TABS: ORAL_TABLET | ORAL | Status: AC

## 2012-01-19 NOTE — Assessment & Plan Note (Signed)
Discussed viral infection vs bacterial and timeline of viral infection. Antibiotics not indicated at this time. Treat with cough suppressant and mucinex. Continue nasal saline and flonase. Pt insistent that as in the past he thinks he will get worse without antibiotics. Prescription given to pt to use if not imrpoving. He is also concerned that azithromycin is not strong enough for him and that he will have to come back for another appt if he is not getting better.  I assured pt that as long as he is not having increase SOB, wheeze, fever on antibotics.. We would be able to try a second course of antibotics second line if not improving after 10 days following Z-pack.

## 2012-01-19 NOTE — Patient Instructions (Addendum)
Mucinex DM during the day.  At night for cough use prescription cough suppressant.  Nasal saline irrigation, continue flonase. Rest and fluids. If not improving as expected can fill antibiotics.

## 2012-01-19 NOTE — Progress Notes (Signed)
  Subjective:    Patient ID: Joshua Lang, male    DOB: 23-Oct-1955, 56 y.o.   MRN: [id number]  Cough This is a new problem. The current episode started in the past 7 days. The problem has been gradually worsening. The problem occurs constantly. The cough is non-productive (constant cough at night). Associated symptoms include chills, a fever, headaches, nasal congestion, rhinorrhea, a sore throat and shortness of breath. Pertinent negatives include no ear congestion, ear pain or wheezing. Associated symptoms comments: Chest congestion Some nausea and constipation. He has tried nothing for the symptoms. There is no history of asthma, COPD or emphysema. nonsmoker      Review of Systems  Constitutional: Positive for fever and chills.  HENT: Positive for sore throat and rhinorrhea. Negative for ear pain.   Respiratory: Positive for cough and shortness of breath. Negative for wheezing.   Neurological: Positive for headaches.       Objective:   Physical Exam  Constitutional: Vital signs are normal. He appears well-developed and well-nourished.  Non-toxic appearance. He does not appear ill. No distress.  HENT:  Head: Normocephalic and atraumatic.  Right Ear: Hearing, tympanic membrane, external ear and ear canal normal. No tenderness. No foreign bodies. Tympanic membrane is not retracted and not bulging.  Left Ear: Hearing, tympanic membrane, external ear and ear canal normal. No tenderness. No foreign bodies. Tympanic membrane is not retracted and not bulging.  Nose: Nose normal. No mucosal edema or rhinorrhea. Right sinus exhibits no maxillary sinus tenderness and no frontal sinus tenderness. Left sinus exhibits no maxillary sinus tenderness and no frontal sinus tenderness.  Mouth/Throat: Uvula is midline, oropharynx is clear and moist and mucous membranes are normal. Normal dentition. No dental caries. No oropharyngeal exudate or tonsillar abscesses.  Eyes: Conjunctivae, EOM and lids are normal.  Pupils are equal, round, and reactive to light. No foreign bodies found.  Neck: Trachea normal, normal range of motion and phonation normal. Neck supple. Carotid bruit is not present. No mass and no thyromegaly present.  Cardiovascular: Normal rate, regular rhythm, S1 normal, S2 normal, normal heart sounds, intact distal pulses and normal pulses.  Exam reveals no gallop.   No murmur heard. Pulmonary/Chest: Effort normal and breath sounds normal. No respiratory distress. He has no wheezes. He has no rhonchi. He has no rales.  Abdominal: Soft. Normal appearance and bowel sounds are normal. There is no hepatosplenomegaly. There is no tenderness. There is no rebound, no guarding and no CVA tenderness. No hernia.  Neurological: He is alert. He has normal reflexes.  Skin: Skin is warm, dry and intact. No rash noted.  Psychiatric: He has a normal mood and affect. His speech is normal and behavior is normal. Judgment normal.          Assessment & Plan:

## 2012-03-22 ENCOUNTER — Other Ambulatory Visit: Payer: Self-pay

## 2012-03-22 MED ORDER — SIMVASTATIN 40 MG PO TABS: 40.0000 mg | ORAL_TABLET | Freq: Every day | ORAL | Status: DC

## 2012-03-22 NOTE — Telephone Encounter (Signed)
Pt request refill simvastatin to [facility] [address]; pt has CPX scheduled 04/06/12.pt notified done.

## 2012-04-06 ENCOUNTER — Ambulatory Visit (INDEPENDENT_AMBULATORY_CARE_PROVIDER_SITE_OTHER): Payer: BC Managed Care – PPO | Admitting: Family Medicine

## 2012-04-06 ENCOUNTER — Encounter: Payer: Self-pay | Admitting: Family Medicine

## 2012-04-06 ENCOUNTER — Encounter: Payer: Self-pay | Admitting: *Deleted

## 2012-04-06 VITALS — BP 118/80 | HR 70 | Temp 98.2°F | Ht 73.25 in | Wt 225.8 lb

## 2012-04-06 DIAGNOSIS — Z131 Encounter for screening for diabetes mellitus: Secondary | ICD-10-CM

## 2012-04-06 DIAGNOSIS — Z125 Encounter for screening for malignant neoplasm of prostate: Secondary | ICD-10-CM

## 2012-04-06 DIAGNOSIS — Z0000 Encounter for general adult medical examination without abnormal findings: Secondary | ICD-10-CM

## 2012-04-06 DIAGNOSIS — Z13220 Encounter for screening for lipoid disorders: Secondary | ICD-10-CM

## 2012-04-06 LAB — PSA: PSA: 0.74 ng/mL (ref 0.10–4.00)

## 2012-04-06 LAB — CBC WITH DIFFERENTIAL/PLATELET
Basophils Relative: 0.3 % (ref 0.0–3.0)
Eosinophils Absolute: 0.1 10*3/uL (ref 0.0–0.7)
Lymphs Abs: 1.5 10*3/uL (ref 0.7–4.0)
Neutro Abs: 3.3 10*3/uL (ref 1.4–7.7)
Neutrophils Relative %: 60.1 % (ref 43.0–77.0)
Platelets: 187 10*3/uL (ref 150.0–400.0)
RDW: 13.9 % (ref 11.5–14.6)
WBC: 5.4 10*3/uL (ref 4.5–10.5)

## 2012-04-06 LAB — HEPATIC FUNCTION PANEL
Albumin: 4.3 g/dL (ref 3.5–5.2)
Alkaline Phosphatase: 59 U/L (ref 39–117)
Total Protein: 7.4 g/dL (ref 6.0–8.3)

## 2012-04-06 LAB — BASIC METABOLIC PANEL
BUN: 13 mg/dL (ref 6–23)
Creatinine, Ser: 0.9 mg/dL (ref 0.4–1.5)
GFR: 92.95 mL/min (ref >60.00)
Glucose, Bld: 99 mg/dL (ref 70–99)

## 2012-04-06 LAB — LIPID PANEL: HDL: 48.5 mg/dL (ref >39.00)

## 2012-04-06 MED ORDER — FLUTICASONE PROPIONATE 50 MCG/ACT NA SUSP: 2.0000 | Freq: Every day | NASAL | Status: DC

## 2012-04-06 MED ORDER — FLUOCINONIDE 0.05 % EX CREA: 1.0000 "application " | TOPICAL_CREAM | Freq: Two times a day (BID) | CUTANEOUS | Status: DC

## 2012-04-06 MED ORDER — VARDENAFIL HCL 20 MG PO TABS: 20.0000 mg | ORAL_TABLET | Freq: Every day | ORAL | Status: DC | PRN

## 2012-04-06 MED ORDER — SIMVASTATIN 40 MG PO TABS: 40.0000 mg | ORAL_TABLET | Freq: Every day | ORAL | Status: DC

## 2012-04-06 NOTE — Progress Notes (Signed)
Nature conservation officer at [facility] [address] [place] [address] Phone: [phone] Fax: [phone]  Date:  04/06/2012   Name:  Joshua Lang   DOB:  02-25-1956   MRN:  [id number] Gender: male Age: 56 y.o.  PCP:  Hannah Beat, MD  Evaluating MD: Hannah Beat, MD   Chief Complaint: Annual Exam   History of Present Illness:  Joshua Lang is a 56 y.o. pleasant patient who presents with the following:  CPX:  About a year ago, saw Dr. Artis Flock, then went to see Dr. Lonna Cobb. Rapaflow or something seems to.   A lot of stress at work, some Environmental manager at work. Found an exercise apparatus, using an ab mat without back pain. Going to [employer], for about thirty minutes on Saturday.   Left toenail fungus -   Some left hip pain and will have some grinding.  Preventative Health Maintenance Visit:  Health Maintenance Summary Reviewed and updated, unless pt declines services.  Tobacco History Reviewed. Alcohol: No concerns, no excessive use Exercise Habits: once a week, some abs additionally STD concerns: no risk or activity to increase risk Drug Use: None Encouraged self-testicular check  Health Maintenance  Topic Date Due  . Influenza Vaccine  01/09/2013  . Colonoscopy  12/09/2017  . Tetanus/tdap  02/14/2019    Patient Active Problem List  Diagnosis  . COLONIC POLYPS  . HYPERCHOLESTEROLEMIA  . ALLERGIC RHINITIS  . NEPHROLITHIASIS  . ERECTILE DYSFUNCTION, ORGANIC  . DEGENERATIVE JOINT DISEASE, HIPS  . UNEQUAL LEG LENGTH  . SLEEP APNEA  . FATIGUE  . Viral URI with cough    Past Medical History  Diagnosis Date  . Colonic polyp   . Nephrolithiasis   . Hypercholesteremia   . 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 ED (erectile dysfunction)   . Allergic rhinitis   . Enlarged prostate     Moderately    Past Surgical History  Procedure Date  . Shoulder surgery 2009    open, 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, ([facility]ooten)  . Total hip arthroplasty 09/2009    (Hooten)     History  Substance Use Topics  . Smoking status: Never Smoker   . Smokeless tobacco: Not on file  . Alcohol Use: Yes    Family History  Problem Relation Age of Onset  . Prostate cancer Father   . Migraines Sister     No Known Allergies  Medication list has been reviewed and updated.  Outpatient Prescriptions Prior to Visit  Medication Sig Dispense Refill  . ascorbic Acid (VITAMIN C) 500 MG CPCR Take 1,000 mg by mouth daily.        Marland Kitchen aspirin 81 MG tablet Take 81 mg by mouth daily.        . B Complex-C (SUPER B COMPLEX) TABS Take 2 tablets by mouth daily.        . chlorpheniramine-HYDROcodone (TUSSIONEX PENNKINETIC ER) 10-8 MG/5ML LQCR Take 5 mLs by mouth every 12 (twelve) hours as needed. For cough-sedation precautions       . cholecalciferol (VITAMIN D) 1000 UNITS tablet Take 2,000 Units by mouth daily.        . fish oil-omega-3 fatty acids 1000 MG capsule Take 2 g by mouth daily.        . fluocinonide cream (LIDEX) 0.05 % Apply 1 application topically as directed.  30 g  1  . fluticasone (FLONASE) 50 MCG/ACT nasal spray Place 2 sprays into the nose daily.  16 g  3  . Glucosamine-Chondroit-Vit C-Mn CAPS Take 2 capsules by mouth daily.        . Misc Natural Products (RED WINE EXTRACT PLUS) CAPS Take 1 capsule by mouth daily.        . Multiple Vitamin (MULTIVITAMIN) tablet Take 1 tablet by mouth daily.        . SAW PALMETTO, SERENOA REPENS, PO Take 1 tablet by mouth 2 (two) times daily.        . simvastatin (ZOCOR) 40 MG tablet Take 1 tablet (40 mg total) by mouth at bedtime.  30 tablet  0  . vardenafil (LEVITRA) 20 MG tablet Take 1 tablet (20 mg total) by mouth daily as needed.  10 tablet  3  . [DISCONTINUED] guaiFENesin-codeine (ROBITUSSIN AC) 100-10 MG/5ML syrup Take 5 mLs by mouth at bedtime as needed for cough.  240 mL  0  . [DISCONTINUED] silodosin (RAPAFLO) 8 MG CAPS capsule Take 8 mg by mouth daily with breakfast.        . [DISCONTINUED] solifenacin (VESICARE) 5 MG  tablet Take 1 tablet (5 mg total) by mouth daily. Take one by mouth daily  30 tablet  11   Last reviewed on 04/06/2012  8:09 AM by Consuello Masse, CMA  Review of Systems:   General: Denies fever, chills, sweats. No significant weight loss. Eyes: Denies blurring,significant itching ENT: Denies earache, sore throat, and hoarseness. Cardiovascular: Denies chest pains, palpitations, dyspnea on exertion Respiratory: Denies cough, dyspnea at rest,wheeezing Breast: no concerns about lumps GI: Denies nausea, vomiting, diarrhea, constipation, change in bowel habits, abdominal pain, melena, hematochezia GU: Denies penile discharge, ED, urinary flow / outflow problems. No STD concerns. Musculoskeletal: Denies back pain, L HIP PAIN Derm: occ penile rash / scrotal Neuro: Denies  paresthesias, frequent falls, frequent headaches Psych: Denies depression, anxiety Endocrine: Denies cold intolerance, heat intolerance, polydipsia Heme: Denies enlarged lymph nodes Allergy: No hayfever   Physical Examination: Filed Vitals:   04/06/12 0805  BP: 118/80  Pulse: 70  Temp: 98.2 F (36.8 C)  TempSrc: Oral  Height: 6' 1.25" (1.861 m)  Weight: 225 lb 12 oz (102.4 kg)  SpO2: 95%    Body mass index is 29.58 kg/(m^2). Ideal Body Weight: Weight in (lb) to have BMI = 25: 190.4   GEN: well developed, well nourished, no acute distress Eyes: conjunctiva and lids normal, PERRLA, EOMI ENT: TM clear, nares clear, oral exam WNL Neck: supple, no lymphadenopathy, no thyromegaly, no JVD Pulm: clear to auscultation and percussion, respiratory effort normal CV: regular rate and rhythm, S1-S2, no murmur, rub or gallop, no bruits, peripheral pulses normal and symmetric, no cyanosis, clubbing, edema or varicosities GI: soft, non-tender; no hepatosplenomegaly, masses; active bowel sounds all quadrants GU: no hernia, testicular mass, penile discharge Lymph: no cervical, axillary or inguinal adenopathy MSK: gait  normal, muscle tone and strength WNL, no joint swelling, effusions, discoloration, crepitus  SKIN: clear, good turgor, color WNL, no rashes, lesions, or ulcerations Neuro: normal mental status, normal strength, sensation, and motion Psych: alert; oriented to person, place and time, normally interactive and not anxious or depressed in appearance.  Assessment and Plan:  1. Routine general medical examination at a health care facility  CBC with Differential, Hepatic function panel  2. Screening for lipoid disorders  Lipid panel  3. Screening for diabetes mellitus  Basic metabolic panel  4. Special screening for malignant neoplasm of prostate  PSA   The patient's preventative maintenance and recommended screening tests for  an annual wellness exam were reviewed in full today. Brought up to date unless services declined.  Counselled on the importance of diet, exercise, and its role in overall health and mortality. The patient's FH and SH was reviewed, including their home life, tobacco status, and drug and alcohol status.    Work on fitness.  Orders Today:  Orders Placed This Encounter  Procedures  . Basic metabolic panel  . CBC with Differential  . Hepatic function panel  . Lipid panel  . PSA    Updated Medication List: (Includes new medications, updates to list, dose adjustments) Meds ordered this encounter  Medications  . fluocinonide cream (LIDEX) 0.05 %    Sig: Apply 1 application topically 2 (two) times daily.    Dispense:  30 g    Refill:  1  . fluticasone (FLONASE) 50 MCG/ACT nasal spray    Sig: Place 2 sprays into the nose daily.    Dispense:  16 g    Refill:  11  . simvastatin (ZOCOR) 40 MG tablet    Sig: Take 1 tablet (40 mg total) by mouth at bedtime.    Dispense:  30 tablet    Refill:  11  . vardenafil (LEVITRA) 20 MG tablet    Sig: Take 1 tablet (20 mg total) by mouth daily as needed for erectile dysfunction.    Dispense:  10 tablet    Refill:  11     Medications Discontinued: Medications Discontinued During This Encounter  Medication Reason  . guaiFENesin-codeine (ROBITUSSIN AC) 100-10 MG/5ML syrup Error  . silodosin (RAPAFLO) 8 MG CAPS capsule Error  . solifenacin (VESICARE) 5 MG tablet Error  . fluocinonide cream (LIDEX) 0.05 % Reorder  . fluticasone (FLONASE) 50 MCG/ACT nasal spray Reorder  . simvastatin (ZOCOR) 40 MG tablet Reorder  . vardenafil (LEVITRA) 20 MG tablet Reorder     Hannah Beat, MD

## 2012-05-03 ENCOUNTER — Telehealth: Payer: Self-pay | Admitting: Family Medicine

## 2012-05-03 NOTE — Telephone Encounter (Signed)
Patient Information:  Caller Name: [facility]  Phone: [phone]  Patient: Joshua Lang, Joshua Lang  Gender: Male  DOB: 03-29-1956  Age: 56 Years  PCP: Hannah Beat (Family Practice)  Office Follow Up:  Does the office need to follow up with this patient?: No  Instructions For The Office: N/A  RN Note:  Had sharp left sided abdominal pain last night which he said almost doubled him over.  He did vomit one time  He has had a stool.  He took a Pepcid with some relief.   He said he has no pain now just tired because he didn't sleep well.   Advised if pain returns to call while it is happening  Symptoms  Reason For Call & Symptoms: abdominal pain  Reviewed Health History In EMR: Yes  Reviewed Medications In EMR: Yes  Reviewed Allergies In EMR: Yes  Reviewed Surgeries / Procedures: Yes  Date of Onset of Symptoms: 05/02/2012  Guideline(s) Used:  Abdominal Pain - Male  Disposition Per Guideline:   Home Care  Reason For Disposition Reached:   Mild abdominal pain  Advice Given:  Rest:  Lie down and rest until you feel better.  Fluids:  Sip clear fluids only (e.g., water, flat soft drinks or half-strength fruit juice) until the pain has been gone for over 2 hours. Then slowly return to a regular diet.  Avoid NSAIDS and Aspirin  : Avoid any drug that can irritate the stomach lining and make the pain worse (especially aspirin and NSAIDs like ibuprofen).

## 2012-05-03 NOTE — Telephone Encounter (Signed)
Will defer to PCP

## 2012-05-05 NOTE — Telephone Encounter (Signed)
Have tried to reach patient on all numbers listed in note and chart and got no answer. Will try again later

## 2012-05-05 NOTE — Telephone Encounter (Signed)
Can you check and see if he is ok? If he is fine, no worries, but if still with abdominal pain, then we should examine.

## 2012-05-05 NOTE — Telephone Encounter (Signed)
Left message for patient to call back if he needed to be seen

## 2012-05-11 HISTORY — PX: SHOULDER OPEN ROTATOR CUFF REPAIR: SHX2407

## 2012-10-12 ENCOUNTER — Ambulatory Visit: Payer: Self-pay | Admitting: General Practice

## 2012-12-15 ENCOUNTER — Other Ambulatory Visit: Payer: Self-pay | Admitting: Family Medicine

## 2013-01-04 ENCOUNTER — Telehealth: Payer: Self-pay

## 2013-01-04 NOTE — Telephone Encounter (Signed)
Podiatry recommendations left on Joshua Lang voicemail.  Ileana Ladd

## 2013-01-04 NOTE — Telephone Encounter (Signed)
Dr. Berdine Dance at [facility] Or Dr. Orland Jarred at [facility]

## 2013-01-04 NOTE — Telephone Encounter (Signed)
Pt left v/m requesting Dr Patsy Lager to recommend a good foot doctor in [place]; pt has slight sensation on ball of foot (no pain) but pt thinks may be related to a nerve that runs along bottom of foot.Please advise. Unable to reach pt by phone to get more info.

## 2013-04-24 ENCOUNTER — Other Ambulatory Visit: Payer: Self-pay

## 2013-04-24 MED ORDER — SIMVASTATIN 40 MG PO TABS: 40.0000 mg | ORAL_TABLET | Freq: Every day | ORAL | Status: DC

## 2013-04-24 NOTE — Telephone Encounter (Signed)
Refill 30, 2 ref  F/u CPX next few months

## 2013-04-24 NOTE — Telephone Encounter (Signed)
Pt left v/m requesting refill simvastatin to [facility] [address]. Pt's last appt was 04/06/12 and no future appt scheduled. Pt has shoulder surgery scheduled 04/26/13 and request refill done by 04/25/13.Please advise.

## 2013-04-26 ENCOUNTER — Ambulatory Visit: Payer: Self-pay | Admitting: General Practice

## 2013-05-09 ENCOUNTER — Telehealth: Payer: Self-pay

## 2013-05-09 NOTE — Telephone Encounter (Signed)
Pt left v/m wanting to discuss possibility of pt taking shingles vaccine.advised pt to contact insurance coverage for shingles vaccine and then pt will call our office to schedule appt or get rx for pharmacy. Pt voiced understanding.

## 2013-05-11 HISTORY — PX: COLONOSCOPY: SHX174

## 2013-06-08 ENCOUNTER — Other Ambulatory Visit: Payer: BC Managed Care – PPO

## 2013-06-08 ENCOUNTER — Ambulatory Visit: Payer: BC Managed Care – PPO

## 2013-06-08 ENCOUNTER — Other Ambulatory Visit: Payer: Self-pay | Admitting: Family Medicine

## 2013-06-08 DIAGNOSIS — Z125 Encounter for screening for malignant neoplasm of prostate: Secondary | ICD-10-CM

## 2013-06-08 DIAGNOSIS — E785 Hyperlipidemia, unspecified: Secondary | ICD-10-CM

## 2013-06-08 DIAGNOSIS — Z79899 Other long term (current) drug therapy: Secondary | ICD-10-CM

## 2013-06-22 ENCOUNTER — Other Ambulatory Visit (INDEPENDENT_AMBULATORY_CARE_PROVIDER_SITE_OTHER): Payer: BC Managed Care – PPO

## 2013-06-22 ENCOUNTER — Ambulatory Visit (INDEPENDENT_AMBULATORY_CARE_PROVIDER_SITE_OTHER): Payer: BC Managed Care – PPO | Admitting: *Deleted

## 2013-06-22 DIAGNOSIS — Z2911 Encounter for prophylactic immunotherapy for respiratory syncytial virus (RSV): Secondary | ICD-10-CM

## 2013-06-22 DIAGNOSIS — E785 Hyperlipidemia, unspecified: Secondary | ICD-10-CM

## 2013-06-22 DIAGNOSIS — Z79899 Other long term (current) drug therapy: Secondary | ICD-10-CM

## 2013-06-22 DIAGNOSIS — Z23 Encounter for immunization: Secondary | ICD-10-CM

## 2013-06-22 DIAGNOSIS — Z125 Encounter for screening for malignant neoplasm of prostate: Secondary | ICD-10-CM

## 2013-06-22 LAB — CBC WITH DIFFERENTIAL/PLATELET
BASOS PCT: 0.5 % (ref 0.0–3.0)
Basophils Absolute: 0 10*3/uL (ref 0.0–0.1)
Eosinophils Absolute: 0.1 10*3/uL (ref 0.0–0.7)
Eosinophils Relative: 2.4 % (ref 0.0–5.0)
HCT: 47.7 % (ref 39.0–52.0)
HEMOGLOBIN: 15.7 g/dL (ref 13.0–17.0)
Lymphocytes Relative: 28 % (ref 12.0–46.0)
Lymphs Abs: 1.5 10*3/uL (ref 0.7–4.0)
MCHC: 32.9 g/dL (ref 30.0–36.0)
MCV: 98.9 fl (ref 78.0–100.0)
Monocytes Absolute: 0.4 10*3/uL (ref 0.1–1.0)
Monocytes Relative: 7.6 % (ref 3.0–12.0)
NEUTROS ABS: 3.4 10*3/uL (ref 1.4–7.7)
Neutrophils Relative %: 61.5 % (ref 43.0–77.0)
Platelets: 235 10*3/uL (ref 150.0–400.0)
RBC: 4.82 Mil/uL (ref 4.22–5.81)
RDW: 13.7 % (ref 11.5–14.6)
WBC: 5.5 10*3/uL (ref 4.5–10.5)

## 2013-06-22 LAB — HEPATIC FUNCTION PANEL
ALK PHOS: 58 U/L (ref 39–117)
ALT: 34 U/L (ref 0–53)
AST: 25 U/L (ref 0–37)
Albumin: 4.2 g/dL (ref 3.5–5.2)
BILIRUBIN DIRECT: 0.1 mg/dL (ref 0.0–0.3)
Total Bilirubin: 0.9 mg/dL (ref 0.3–1.2)
Total Protein: 6.9 g/dL (ref 6.0–8.3)

## 2013-06-22 LAB — LIPID PANEL
Cholesterol: 153 mg/dL (ref 0–200)
HDL: 40.6 mg/dL (ref >39.00)
LDL Cholesterol: 98 mg/dL (ref 0–99)
Total CHOL/HDL Ratio: 4
Triglycerides: 72 mg/dL (ref 0.0–149.0)
VLDL: 14.4 mg/dL (ref 0.0–40.0)

## 2013-06-22 LAB — BASIC METABOLIC PANEL
BUN: 15 mg/dL (ref 6–23)
CALCIUM: 9.7 mg/dL (ref 8.4–10.5)
CO2: 29 mEq/L (ref 19–32)
CREATININE: 0.9 mg/dL (ref 0.4–1.5)
Chloride: 105 mEq/L (ref 96–112)
GFR: 88.01 mL/min (ref >60.00)
Glucose, Bld: 96 mg/dL (ref 70–99)
Potassium: 4.8 mEq/L (ref 3.5–5.1)
Sodium: 141 mEq/L (ref 135–145)

## 2013-06-22 LAB — PSA: PSA: 0.83 ng/mL (ref 0.10–4.00)

## 2013-06-27 ENCOUNTER — Ambulatory Visit: Payer: BC Managed Care – PPO

## 2013-06-28 ENCOUNTER — Encounter: Payer: Self-pay | Admitting: Family Medicine

## 2013-06-28 ENCOUNTER — Ambulatory Visit (INDEPENDENT_AMBULATORY_CARE_PROVIDER_SITE_OTHER): Payer: BC Managed Care – PPO | Admitting: Family Medicine

## 2013-06-28 VITALS — BP 100/70 | HR 71 | Temp 98.0°F | Ht 73.0 in | Wt 208.5 lb

## 2013-06-28 DIAGNOSIS — N529 Male erectile dysfunction, unspecified: Secondary | ICD-10-CM

## 2013-06-28 DIAGNOSIS — A630 Anogenital (venereal) warts: Secondary | ICD-10-CM

## 2013-06-28 DIAGNOSIS — R1032 Left lower quadrant pain: Secondary | ICD-10-CM

## 2013-06-28 DIAGNOSIS — M7750 Other enthesopathy of unspecified foot: Secondary | ICD-10-CM

## 2013-06-28 DIAGNOSIS — M7741 Metatarsalgia, right foot: Secondary | ICD-10-CM

## 2013-06-28 DIAGNOSIS — E7800 Pure hypercholesterolemia, unspecified: Secondary | ICD-10-CM

## 2013-06-28 DIAGNOSIS — Z0000 Encounter for general adult medical examination without abnormal findings: Secondary | ICD-10-CM

## 2013-06-28 MED ORDER — FLUOCINONIDE 0.05 % EX CREA: TOPICAL_CREAM | CUTANEOUS | Status: DC

## 2013-06-28 MED ORDER — VARDENAFIL HCL 20 MG PO TABS: 20.0000 mg | ORAL_TABLET | Freq: Every day | ORAL | Status: DC | PRN

## 2013-06-28 MED ORDER — SIMVASTATIN 40 MG PO TABS: 40.0000 mg | ORAL_TABLET | Freq: Every day | ORAL | Status: DC

## 2013-06-28 MED ORDER — KETOTIFEN FUMARATE 0.025 % OP SOLN: 2.0000 [drp] | Freq: Every day | OPHTHALMIC | Status: DC

## 2013-06-28 MED ORDER — FLUTICASONE PROPIONATE 50 MCG/ACT NA SUSP: 2.0000 | Freq: Every day | NASAL | Status: DC

## 2013-06-28 NOTE — Progress Notes (Signed)
Pre-visit discussion using our clinic review tool. No additional management support is needed unless otherwise documented below in the visit note.  

## 2013-06-28 NOTE — Progress Notes (Signed)
Date:  06/28/2013   Name:  Joshua Lang   DOB:  1956/01/06   MRN:  [id number] Gender: male Age: 58 y.o.  Primary Physician:  Owens Loffler, MD   Chief Complaint: Annual Exam   Subjective:   History of Present Illness:  Joshua Lang is a 58 y.o. pleasant patient who presents with the following:  Preventative Health Maintenance Visit as well as multiple ongoing acute issues and an additional procedure:  Health Maintenance Summary Reviewed and updated, unless pt declines services.  Tobacco History Reviewed. Alcohol: No concerns, no excessive use Exercise Habits: Generally active, but has been limited mostly from his recent shoulder surgery STD concerns: no risk or activity to increase risk (not active now) Drug Use: None Encouraged self-testicular check  Went to dr. Bernardo Heater, prostate exam. Father with history of prostate CA  On a place to upper lip, no cancer was there, and MD in [place]. His dentist was concerned about this  Rotator cuff surgery on 04/26/2013 by Dr. [name]. PT twice a week.  Had a tooth pulled in his lower. Pulled off cap, some deterioration.   Also saw Dr. Elvina Mattes. Has some metatarsalgia. Thought bones were a little bit alignment. Not much of a problem. It does bother him, though, on the RIGHT  Base of penis, there is a dark spot. He does not know what it is, but it has been there, and it has grown in the last couple of years.   Slight pain in his left lower side. Worried that he could potentially have a hernia. ??? No lifting or accident. No clear bulge. He reports that he also needs a f/u colonoscopy, which he is aware of and will schedule. O/w no melena or bloody stools. It does not bother him to do any abdominal or oblique work.  He saw Dr. Koleen Nimrod, Derm, concerned about lower lip area.   Health Maintenance  Topic Date Due  . Influenza Vaccine  12/09/2012  . Colonoscopy  12/09/2017  . Tetanus/tdap  02/14/2019  -- we may have colon data wrong in  our system, since done outside facility and I do not have path or consultation notes. 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  Immunization History  Administered Date(s) Administered  . Influenza Whole 02/08/2009, 02/07/2012  . Td 02/13/2009  . Zoster 06/22/2013    Patient Active Problem List   Diagnosis Date Noted  . FATIGUE 03/12/2010  . ALLERGIC RHINITIS 02/17/2009  . 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 UNEQUAL LEG LENGTH 02/13/2009  . SLEEP APNEA 02/13/2009  . COLONIC POLYPS 10/30/2008  . HYPERCHOLESTEROLEMIA 10/30/2008  . NEPHROLITHIASIS 10/30/2008  . DEGENERATIVE JOINT DISEASE, HIPS 10/30/2008    Past Medical History  Diagnosis Date  . Colonic polyp   . Nephrolithiasis   . Hypercholesteremia   . 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 ED (erectile dysfunction)   . Allergic rhinitis   . Enlarged prostate     Moderately    Past Surgical History  Procedure Laterality Date  . Shoulder surgery  2009    open, 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, (Jim Hooten)  . Total hip arthroplasty  09/2009    (Hooten)  . Shoulder open rotator cuff repair  2014    Hooten, partial RTC tear with probably SAD, possible DCE    History   Social History  . Marital Status: Single    Spouse Name: N/A    Number of Children: N/A  . Years of Education: N/A  Occupational History  . [employer], [employer], head     Doctorate   Social History Main Topics  . Smoking status: Never Smoker   . Smokeless tobacco: Never Used  . Alcohol Use: Yes     Comment: occassional beer  . Drug Use: No  . Sexual Activity: Not on file   Other Topics Concern  . Not on file   Social History Narrative  . No narrative on file    Family History  Problem Relation Age of Onset  . Prostate cancer Father   . Migraines Sister     No Known Allergies  Medication list has been reviewed and updated.  Review of Systems:  General: Denies fever, chills, sweats. No significant weight loss. Eyes: Denies blurring,significant  itching ENT: Denies earache, sore throat, and hoarseness. - AS ABOVE Cardiovascular: Denies chest pains, palpitations, dyspnea on exertion Respiratory: Denies cough, dyspnea at rest,wheeezing Breast: no concerns about lumps GI: Denies nausea, vomiting, diarrhea, constipation, change in bowel habits, + abdominal pain, melena, hematochezia. PAIN AS ABOVE GU: Denies penile discharge, ED, urinary flow / outflow problems. No STD concerns. LESION AS NOTED Musculoskeletal: Denies back pain, joint pain - MULTIPLE. SHOULDER AS NOTED, RECOVERING Derm: Denies rash, itching. LESION ON PENIS Neuro: Denies  paresthesias, frequent falls, frequent headaches Psych: Denies depression, anxiety Endocrine: Denies cold intolerance, heat intolerance, polydipsia Heme: Denies enlarged lymph nodes Allergy: No hayfever  Objective:   Physical Examination: BP 100/70  Pulse 71  Temp(Src) 98 F (36.7 C) (Oral)  Ht 6\' 1"  (1.854 m)  Wt 208 lb 8 oz (94.575 kg)  BMI 27.51 kg/m2 Ideal Body Weight: Weight in (lb) to have BMI = 25: 189.1  GEN: well developed, well nourished, no acute distress Eyes: conjunctiva and lids normal, PERRLA, EOMI ENT: TM clear, nares clear, oral exam WNL Neck: supple, no lymphadenopathy, no thyromegaly, no JVD Pulm: clear to auscultation and percussion, respiratory effort normal CV: regular rate and rhythm, S1-S2, no murmur, rub or gallop, no bruits, peripheral pulses normal and symmetric, no cyanosis, clubbing, edema or varicosities GI: soft, non-tender; no hepatosplenomegaly, masses; active bowel sounds all quadrants GU: no hernia, testicular mass, penile discharge. AT THE BASE OF PENIS, THERE IS A DARK, ELEVATED LESION THAT APPEARS MOSTLY VERRUCOUS.  Lymph: no cervical, axillary or inguinal adenopathy MSK: gait normal, muscle tone and strength WNL, no joint swelling, effusions, discoloration, crepitus  SKIN: clear, good turgor, color WNL, no rashes, lesions, or ulcerations Neuro: normal  mental status, normal strength, sensation, and motion Psych: alert; oriented to person, place and time, normally interactive and not anxious or depressed in appearance.  FEET: B Echymosis: no Edema: no ROM: full LE B Gait: heel toe, non-antalgic MT pain: no Callus pattern: none Lateral Mall: NT Medial Mall: NT Talus: NT Navicular: NT Cuboid: NT Calcaneous: NT Metatarsals: NT 5th MT: NT Phalanges: NT Achilles: NT Plantar Fascia: NT Fat Pad: NT Peroneals: NT Post Tib: NT Great Toe: Nml motion Ant Drawer: neg ATFL: NT CFL: NT Deltoid: NT Other foot breakdown: none Long arch: mildly dropped Transverse arch: dropped with significant toe splaying, bunionette formation, dropped MT heads 2-4, R > L Hindfoot breakdown: none Sensation: intact   All labs reviewed with patient.  Lipids:    Component Value Date/Time   CHOL 153 06/22/2013 0927   TRIG 72.0 06/22/2013 0927   HDL 40.60 06/22/2013 0927   VLDL 14.4 06/22/2013 0927   CHOLHDL 4 06/22/2013 0927    CBC:    Component Value Date/Time  WBC 5.5 06/22/2013 0927   HGB 15.7 06/22/2013 0927   HCT 47.7 06/22/2013 0927   PLT 235.0 06/22/2013 0927   MCV 98.9 06/22/2013 0927   NEUTROABS 3.4 06/22/2013 0927   LYMPHSABS 1.5 06/22/2013 0927   MONOABS 0.4 06/22/2013 0927   EOSABS 0.1 06/22/2013 0927   BASOSABS 0.0 06/22/2013 4034    Basic Metabolic Panel:    Component Value Date/Time   NA 141 06/22/2013 0927   K 4.8 06/22/2013 0927   CL 105 06/22/2013 0927   CO2 29 06/22/2013 0927   BUN 15 06/22/2013 0927   CREATININE 0.9 06/22/2013 0927   GLUCOSE 96 06/22/2013 0927   CALCIUM 9.7 06/22/2013 0927    Lab Results  Component Value Date   ALT 34 06/22/2013   AST 25 06/22/2013   ALKPHOS 58 06/22/2013   BILITOT 0.9 06/22/2013    Lab Results  Component Value Date   PSA 0.83 06/22/2013   PSA 0.74 04/06/2012   PSA 0.75 03/12/2010    Assessment & Plan:   Health Maintenance Exam: The patient's preventative maintenance and recommended  screening tests for an annual wellness exam were reviewed in full today. Brought up to date unless services declined.  Counselled on the importance of diet, exercise, and its role in overall health and mortality. The patient's FH and SH was reviewed, including their home life, tobacco status, and drug and alcohol status.   Routine general medical examination at a health care facility  Abdominal pain, left lower quadrant - Plan: CT Abdomen Pelvis W Contrast  Obtain a CT of the abdomen and pelvis with contrast to evaluate for potential hernia, mass lesion, diverticulitis, or other ongoing acute abdominal issue causing the patient's symptoms. I do not think that this is MSK in nature.  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 I had one of my partners evaluate also, and he concurred. Patient somewhat upset, but I tried to explain this is not usually an acute infection and could have had this at any point in his life, similar to wart on foot or hand and often is dormant. He wanted to remove ASAP.  Cryotherapy  Reason: uncomfortable, irritating, causing stress in patient Location: base of penis  Liquid nitrogen was applied using the liquid nitrogen gun without difficulty with an otoscope tip for concentration. Tolerated well without complications.  Metatarsalgia of right foot: I suspect from forefoot breakdown. I gave him a hapad cookie to try. He has something from Dr. Elvina Mattes that did not really help. Given Dr. Pearlie Oyster name if he wants another Podiatry opinion.  >15 minutes spent in face to face time with patient, >50% spent in counselling or coordination of care: over and above health maintenance and procedural time spent in discussing above conditions and counselling.  Patient Instructions  REFERRALS TO SPECIALISTS, SPECIAL TESTS (MRI, CT, ULTRASOUNDS)  GO THE  WAITING ROOM AND TELL CHECK IN YOU NEED HELP WITH A REFERRAL. Either [facility] or [facility] will help you set it up.  If it is between 1-2 PM they may be at lunch.  After 5 PM, they will likely be at home.  They will call you, so please make sure the office has your correct phone number.  Referrals sometimes can be done same day if urgent, but others can take 2 or 3 days to get an appointment. Starting in 2015, some of the new Medicare  insurance plans and Affordable Care Health plans offered on the Exchange take longer for referrals. They have added additional paperwork and steps.  MRI's and CT's can take up to a week for the test. (Emergencies like strokes take precedence. I will tell you if you have an emergency.)   Specialist appointment times vary a great deal, mostly on the specialist's schedule and if they have openings. -- Our office tries to get you in as fast as possible. -- Some specialists have very long wait times. (Example. Dermatology. Usually months) -- If you have a true emergency like new cancer, we work to get you in ASAP.    Orders Placed This Encounter  Procedures  . CT Abdomen Pelvis W Contrast   Signed,  Kirstan Fentress T. Kay Ricciuti, MD, CAQ Sports Medicine  [facility] at [facility] [address] [place] [address] Phone: [phone] Fax: [phone]  Patient's Medications  New Prescriptions   No medications on file  Previous Medications   ASCORBIC ACID (VITAMIN C WITH ROSE HIPS) 1000 MG TABLET    Take 1,000 mg by mouth 2 (two) times daily.   ASPIRIN 81 MG TABLET    Take 81 mg by mouth daily.     B COMPLEX-C (SUPER B COMPLEX) TABS    Take 1 tablet by mouth 2 (two) times daily.    CHOLECALCIFEROL (VITAMIN D) 1000 UNITS TABLET    Take 2,000 Units by mouth daily.     FISH OIL-OMEGA-3 FATTY ACIDS 1000 MG CAPSULE    Take 2 g by mouth daily.     GLUCOSAMINE-CHONDROIT-VIT C-MN CAPS    Take 1 capsule by mouth 2 (two) times daily.    MISC NATURAL PRODUCTS (RED WINE  EXTRACT PLUS) CAPS    Take 1 capsule by mouth 2 (two) times daily.    MULTIPLE VITAMIN (MULTIVITAMIN) TABLET    Take 1 tablet by mouth daily.     POLYCARBOPHIL (FIBERCON) 625 MG TABLET    Take 1,875 mg by mouth 2 (two) times daily.   PROBIOTIC PRODUCT (PROBIOTIC DAILY PO)    Take 1 capsule by mouth at bedtime.   SAW PALMETTO, SERENOA REPENS, PO    Take 1 tablet by mouth 2 (two) times daily.    Modified Medications   Modified Medication Previous Medication   FLUOCINONIDE CREAM (LIDEX) 0.05 % fluocinonide cream (LIDEX) 0.05 %      Apply bid as directed    apply topically twice a day   FLUTICASONE (FLONASE) 50 MCG/ACT NASAL SPRAY fluticasone (FLONASE) 50 MCG/ACT nasal spray      Place 2 sprays into both nostrils daily.    Place 2 sprays into the nose daily.   KETOTIFEN (ZADITOR) 0.025 % OPHTHALMIC SOLUTION ketotifen (ZADITOR) 0.025 % ophthalmic solution      Place 2 drops into both eyes at bedtime.    Place 2 drops into both eyes at bedtime.   SIMVASTATIN (ZOCOR) 40 MG TABLET simvastatin (ZOCOR) 40 MG tablet      Take 1 tablet (40 mg total) by mouth at bedtime.    Take 1 tablet (40 mg total) by mouth at bedtime.   VARDENAFIL (LEVITRA) 20 MG TABLET vardenafil (LEVITRA) 20 MG tablet      Take 1 tablet (20 mg total) by mouth daily as needed for erectile dysfunction.    Take 1 tablet (20 mg total) by mouth daily as needed for erectile dysfunction.  Discontinued Medications   ASCORBIC ACID (VITAMIN C) 500 MG CPCR  Take 1,000 mg by mouth daily.     CHLORPHENIRAMINE-HYDROCODONE (TUSSIONEX PENNKINETIC ER) 10-8 MG/5ML LQCR    Take 5 mLs by mouth every 12 (twelve) hours as needed. For cough-sedation precautions

## 2013-06-28 NOTE — Patient Instructions (Signed)
REFERRALS TO SPECIALISTS, SPECIAL TESTS (MRI, CT, ULTRASOUNDS)  GO THE WAITING ROOM AND TELL CHECK IN YOU NEED HELP WITH A REFERRAL. Either [facility] or [facility]DA will help you set it up.  If it is between 1-2 PM they may be at lunch.  After 5 PM, they will likely be at home.  They will call you, so please make sure the office has your correct phone number.  Referrals sometimes can be done same day if urgent, but others can take 2 or 3 days to get an appointment. Starting in 2015, some of the new Medicare insurance plans and Affordable Care Health plans offered on the Exchange take longer for referrals. They have added additional paperwork and steps.  MRI's and CT's can take up to a week for the test. (Emergencies like strokes take precedence. I will tell you if you have an emergency.)   Specialist appointment times vary a great deal, mostly on the specialist's schedule and if they have openings. -- Our office tries to get you in as fast as possible. -- Some specialists have very long wait times. (Example. Dermatology. Usually months) -- If you have a true emergency like new cancer, we work to get you in ASAP.   

## 2013-06-29 ENCOUNTER — Encounter: Payer: Self-pay | Admitting: Family Medicine

## 2013-07-05 ENCOUNTER — Ambulatory Visit: Payer: Self-pay | Admitting: Family Medicine

## 2013-07-07 ENCOUNTER — Encounter: Payer: Self-pay | Admitting: Family Medicine

## 2013-07-28 ENCOUNTER — Ambulatory Visit: Payer: Self-pay | Admitting: Gastroenterology

## 2013-09-25 ENCOUNTER — Ambulatory Visit: Payer: BC Managed Care – PPO | Admitting: Internal Medicine

## 2013-09-25 ENCOUNTER — Encounter: Payer: Self-pay | Admitting: Family Medicine

## 2013-09-25 ENCOUNTER — Ambulatory Visit (INDEPENDENT_AMBULATORY_CARE_PROVIDER_SITE_OTHER): Payer: BC Managed Care – PPO | Admitting: Family Medicine

## 2013-09-25 VITALS — BP 110/72 | HR 72 | Temp 98.1°F | Ht 73.0 in | Wt 217.5 lb

## 2013-09-25 DIAGNOSIS — J988 Other specified respiratory disorders: Secondary | ICD-10-CM

## 2013-09-25 DIAGNOSIS — Z0289 Encounter for other administrative examinations: Secondary | ICD-10-CM

## 2013-09-25 MED ORDER — AZITHROMYCIN 250 MG PO TABS: ORAL_TABLET | ORAL | Status: DC

## 2013-09-25 MED ORDER — PREDNISONE 20 MG PO TABS: 20.0000 mg | ORAL_TABLET | Freq: Every day | ORAL | Status: DC

## 2013-09-25 NOTE — Progress Notes (Signed)
No chief complaint on file.   HPI:  Joshua Lang is a 51 you patient of Dr. Lorelei Pont here for an acute visit for:  1)Nasal Congestion/Cough/Wheezing: -PCP unavailable - reports he had appt at PCP office and went there but was 10 minutes late for appt so they sent him here -hx of allergic rhinitis, report hx of "bronchitis" almost yearly requiring "zpack" "nothing else works" -reports: started 2-3 weeks ago, started with nasal congestion, cough, pnd, sneezing and now "cough is in chest", wheezing, felt SOB  - now better - reports took allergy medication and helped some -denies: fevers, CP, sinus pain, ear pain, tooth pain  ROS: See pertinent positives and negatives per HPI.  Past Medical History  Diagnosis Date  . Colonic polyp   . Nephrolithiasis   . Hypercholesteremia   . 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 ED (erectile dysfunction)   . Allergic rhinitis   . Enlarged prostate     Moderately    Past Surgical History  Procedure Laterality Date  . Shoulder surgery  2009    open, 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, (Jim Hooten)  . Total hip arthroplasty  09/2009    (Hooten)  . Shoulder open rotator cuff repair  2014    Hooten, partial RTC tear with probably SAD, possible DCE    Family History  Problem Relation Age of Onset  . Prostate cancer Father   . Migraines Sister     History   Social History  . Marital Status: Single    Spouse Name: N/A    Number of Children: N/A  . Years of Education: N/A   Occupational History  . [employer], [employer], head     Doctorate   Social History Main Topics  . Smoking status: Never Smoker   . Smokeless tobacco: Never Used  . Alcohol Use: Yes     Comment: occassional beer  . Drug Use: No  . Sexual Activity: None   Other Topics Concern  . None   Social History Narrative  . None    Current outpatient prescriptions:Ascorbic Acid (VITAMIN C WITH ROSE HIPS) 1000 MG tablet, Take 1,000 mg by mouth 2 (two) times daily., Disp: , Rfl: ;  aspirin  81 MG tablet, Take 81 mg by mouth daily.  , Disp: , Rfl: ;  B Complex-C (SUPER B COMPLEX) TABS, Take 1 tablet by mouth 2 (two) times daily. , Disp: , Rfl: ;  cholecalciferol (VITAMIN D) 1000 UNITS tablet, Take 2,000 Units by mouth daily.  , Disp: , Rfl:  fish oil-omega-3 fatty acids 1000 MG capsule, Take 2 g by mouth daily.  , Disp: , Rfl: ;  fluocinonide cream (LIDEX) 0.05 %, Apply bid as directed, Disp: 60 g, Rfl: 3;  fluticasone (FLONASE) 50 MCG/ACT nasal spray, Place 2 sprays into both nostrils daily., Disp: 16 g, Rfl: 11;  Glucosamine-Chondroit-Vit C-Mn CAPS, Take 1 capsule by mouth 2 (two) times daily. , Disp: , Rfl:  ketotifen (ZADITOR) 0.025 % ophthalmic solution, Place 2 drops into both eyes at bedtime., Disp: 5 mL, Rfl: 11;  Misc Natural Products (RED WINE EXTRACT PLUS) CAPS, Take 1 capsule by mouth 2 (two) times daily. , Disp: , Rfl: ;  Multiple Vitamin (MULTIVITAMIN) tablet, Take 1 tablet by mouth daily.  , Disp: , Rfl: ;  polycarbophil (FIBERCON) 625 MG tablet, Take 1,875 mg by mouth 2 (two) times daily., Disp: , Rfl:  Probiotic Product (PROBIOTIC DAILY PO), Take 1 capsule by mouth at  bedtime., Disp: , Rfl: ;  SAW PALMETTO, SERENOA REPENS, PO, Take 1 tablet by mouth 2 (two) times daily.  , Disp: , Rfl: ;  simvastatin (ZOCOR) 40 MG tablet, Take 1 tablet (40 mg total) by mouth at bedtime., Disp: 30 tablet, Rfl: 11;  vardenafil (LEVITRA) 20 MG tablet, Take 1 tablet (20 mg total) by mouth daily as needed for erectile dysfunction., Disp: 10 tablet, Rfl: 11 azithromycin (ZITHROMAX) 250 MG tablet, 2 tabs day 1, then one tab daily, Disp: 6 tablet, Rfl: 0;  predniSONE (DELTASONE) 20 MG tablet, Take 1 tablet (20 mg total) by mouth daily with breakfast., Disp: 8 tablet, Rfl: 0  EXAM:  Filed Vitals:   09/25/13 1457  BP: 110/72  Pulse: 72  Temp: 98.1 F (36.7 C)    Body mass index is 28.7 kg/(m^2).  GENERAL: vitals reviewed and listed above, alert, oriented, appears well hydrated and in no acute  distress  HEENT: atraumatic, conjunttiva clear, no obvious abnormalities on inspection of external nose and ears  NECK: no obvious masses on inspection  LUNGS: clear to auscultation bilaterally, no wheezes, rales or rhonchi, good air movement  CV: HRRR, no peripheral edema  MS: moves all extremities without noticeable abnormality  PSYCH: pleasant and cooperative, no obvious depression or anxiety  ASSESSMENT AND PLAN:  Discussed the following assessment and plan:  Respiratory infection - Plan: predniSONE (DELTASONE) 20 MG tablet, azithromycin (ZITHROMAX) 250 MG tablet  -we discussed possible serious and likely etiologies, workup and treatment, treatment risks and return precautions - advised likely viral or allergic, less likely bacterial resp infection -after this discussion, [name] insisted on abx and agreed on prednisone after discussion risks and benefits -follow up advised with his PCP if needed -of course, we advised [name]  to return or notify a doctor immediately if symptoms worsen or persist or new concerns arise.   -Patient advised to return or notify a doctor immediately if symptoms worsen or persist or new concerns arise.  Patient Instructions  -As we discussed, we have prescribed a new medication for you at this appointment. We discussed the common and serious potential adverse effects of this medication and you can review these and more with the pharmacist when you pick up your medication.  Please follow the instructions for use carefully and notify us immediately if you have any problems taking this medication.  -continue allergy medications  -see your doctor immediatly if worsening or if not improving      [name]

## 2013-09-25 NOTE — Progress Notes (Signed)
Pre visit review using our clinic review tool, if applicable. No additional management support is needed unless otherwise documented below in the visit note. 

## 2013-09-25 NOTE — Patient Instructions (Signed)
-  As we discussed, we have prescribed a new medication for you at this appointment. We discussed the common and serious potential adverse effects of this medication and you can review these and more with the pharmacist when you pick up your medication.  Please follow the instructions for use carefully and notify us immediately if you have any problems taking this medication.  -continue allergy medications  -see your doctor immediatly if worsening or if not improving

## 2013-12-04 ENCOUNTER — Telehealth: Payer: Self-pay

## 2013-12-04 NOTE — Telephone Encounter (Signed)
Left message for Joshua Lang to return my call. 

## 2013-12-04 NOTE — Telephone Encounter (Signed)
Generic Revatio / Sildanefil 20 mg. 2 - 5 tabs 30 mins prior to intercourse. #50, 11 refills.  Make sure he understands this is cash / credit card, not to be run through insurance.   

## 2013-12-04 NOTE — Telephone Encounter (Signed)
Pt left v/m; pt uses Levitra occasionally; pt is needing to refill Levitra and is very expensive; cost to pt is $400 + dollars for 10 tabs. Pt request less expensive alternative in generic form. Pt request [email or web address].

## 2013-12-05 MED ORDER — SILDENAFIL CITRATE 20 MG PO TABS: ORAL_TABLET | ORAL | Status: DC

## 2013-12-05 NOTE — Telephone Encounter (Signed)
Mr. Heinicke notified as instructed by telephone.  He would like to get the Sildanefil 20 mg.  Prescription sent to [facility].

## 2014-01-12 ENCOUNTER — Telehealth: Payer: Self-pay | Admitting: Family Medicine

## 2014-01-12 NOTE — Telephone Encounter (Signed)
[employer]or, mining in [place]. None to my knowledge in [place].  Thornell Sartorius, [facility]  [address] [place], [place]? [phone]

## 2014-01-12 NOTE — Telephone Encounter (Signed)
Mr. Rain[place] given Dr. Melburn Hake information as instructed by Dr. Lorelei Pont.

## 2014-01-12 NOTE — Telephone Encounter (Signed)
Pt would like your recommendation for a physician that treats minor scars. Dr. Janalee Dane who you recommended at pt's cpe has moved to [facility] and he would like something closer. Please advise!

## 2014-03-09 ENCOUNTER — Ambulatory Visit (INDEPENDENT_AMBULATORY_CARE_PROVIDER_SITE_OTHER): Payer: BC Managed Care – PPO | Admitting: Family Medicine

## 2014-03-09 ENCOUNTER — Encounter: Payer: Self-pay | Admitting: Family Medicine

## 2014-03-09 VITALS — BP 100/70 | HR 66 | Temp 97.9°F | Ht 73.0 in | Wt 207.2 lb

## 2014-03-09 DIAGNOSIS — J209 Acute bronchitis, unspecified: Secondary | ICD-10-CM | POA: Insufficient documentation

## 2014-03-09 MED ORDER — DOXYCYCLINE HYCLATE 100 MG PO TABS: 100.0000 mg | ORAL_TABLET | Freq: Two times a day (BID) | ORAL | Status: DC

## 2014-03-09 MED ORDER — PREDNISONE 20 MG PO TABS: ORAL_TABLET | ORAL | Status: DC

## 2014-03-09 MED ORDER — ALBUTEROL SULFATE HFA 108 (90 BASE) MCG/ACT IN AERS: 2.0000 | INHALATION_SPRAY | Freq: Four times a day (QID) | RESPIRATORY_TRACT | Status: DC | PRN

## 2014-03-09 NOTE — Progress Notes (Signed)
   Subjective:    Patient ID: Joshua Lang, male    DOB: 20-Apr-1956, 58 y.o.   MRN: [id number]  Cough This is a new problem. The current episode started in the past 7 days. The cough is productive of sputum. Associated symptoms include chills, ear congestion, a fever, myalgias, nasal congestion, rhinorrhea, a sore throat and shortness of breath. Pertinent negatives include no chest pain, ear pain, headaches, sweats or wheezing. Associated symptoms comments: Low grade. The symptoms are aggravated by lying down. Risk factors: non smoker. Treatments tried: fluids, nyquil, mucinex. The treatment provided mild relief. His past medical history is significant for environmental allergies. There is no history of asthma, bronchiectasis, bronchitis, COPD, emphysema or pneumonia.      Review of Systems  Constitutional: Positive for fever and chills.  HENT: Positive for rhinorrhea and sore throat. Negative for ear pain.   Respiratory: Positive for cough and shortness of breath. Negative for wheezing.   Cardiovascular: Negative for chest pain.  Musculoskeletal: Positive for myalgias.  Allergic/Immunologic: Positive for environmental allergies.  Neurological: Negative for headaches.       Objective:   Physical Exam  Constitutional: Vital signs are normal. He appears well-developed and well-nourished.  Non-toxic appearance. He does not appear ill. No distress.  HENT:  Head: Normocephalic and atraumatic.  Right Ear: Hearing, tympanic membrane, external ear and ear canal normal. No tenderness. No foreign bodies. Tympanic membrane is not retracted and not bulging.  Left Ear: Hearing, tympanic membrane, external ear and ear canal normal. No tenderness. No foreign bodies. Tympanic membrane is not retracted and not bulging.  Nose: Nose normal. No mucosal edema or rhinorrhea. Right sinus exhibits no maxillary sinus tenderness and no frontal sinus tenderness. Left sinus exhibits no maxillary sinus tenderness and  no frontal sinus tenderness.  Mouth/Throat: Uvula is midline, oropharynx is clear and moist and mucous membranes are normal. Normal dentition. No dental caries. No oropharyngeal exudate or tonsillar abscesses.  Eyes: Conjunctivae, EOM and lids are normal. Pupils are equal, round, and reactive to light. Lids are everted and swept, no foreign bodies found.  Neck: Trachea normal, normal range of motion and phonation normal. Neck supple. Carotid bruit is not present. No mass and no thyromegaly present.  Cardiovascular: Normal rate, regular rhythm, S1 normal, S2 normal, normal heart sounds, intact distal pulses and normal pulses.  Exam reveals no gallop.   No murmur heard. Pulmonary/Chest: Effort normal. No respiratory distress. He has wheezes. He has no rhonchi. He has no rales.  Diffuse wheeze  Abdominal: Soft. Normal appearance and bowel sounds are normal. There is no hepatosplenomegaly. There is no tenderness. There is no rebound, no guarding and no CVA tenderness. No hernia.  Neurological: He is alert. He has normal reflexes.  Skin: Skin is warm, dry and intact. No rash noted.  Psychiatric: He has a normal mood and affect. His speech is normal and behavior is normal. Judgment normal.          Assessment & Plan:

## 2014-03-09 NOTE — Assessment & Plan Note (Signed)
With wheeze.  Treat with albuterol prn, and antibiotics.  if not improving with SOB if 2-3 days.. Fill prednisone taper.  Go to ER with severe shortness of breath.

## 2014-03-09 NOTE — Progress Notes (Signed)
Pre visit review using our clinic review tool, if applicable. No additional management support is needed unless otherwise documented below in the visit note. 

## 2014-03-09 NOTE — Patient Instructions (Signed)
Treat with albuterol prn, and antibiotics. If not improving with SOB if 2-3 days.. Fill prednisone taper. Go to ER with severe shortness of breath.

## 2014-06-08 ENCOUNTER — Ambulatory Visit (INDEPENDENT_AMBULATORY_CARE_PROVIDER_SITE_OTHER): Payer: BC Managed Care – PPO | Admitting: Internal Medicine

## 2014-06-08 ENCOUNTER — Encounter: Payer: Self-pay | Admitting: Internal Medicine

## 2014-06-08 VITALS — BP 114/78 | HR 60 | Temp 98.0°F | Wt 211.8 lb

## 2014-06-08 DIAGNOSIS — R350 Frequency of micturition: Secondary | ICD-10-CM

## 2014-06-08 DIAGNOSIS — N410 Acute prostatitis: Secondary | ICD-10-CM

## 2014-06-08 DIAGNOSIS — R300 Dysuria: Secondary | ICD-10-CM

## 2014-06-08 LAB — POCT URINALYSIS DIPSTICK
BILIRUBIN UA: NEGATIVE
Blood, UA: NEGATIVE
Glucose, UA: NEGATIVE
KETONES UA: NEGATIVE
Leukocytes, UA: NEGATIVE
NITRITE UA: NEGATIVE
PH UA: 7
PROTEIN UA: NEGATIVE
Spec Grav, UA: 1.02
UROBILINOGEN UA: 0.2

## 2014-06-08 MED ORDER — CIPROFLOXACIN HCL 500 MG PO TABS: 500.0000 mg | ORAL_TABLET | Freq: Two times a day (BID) | ORAL | Status: DC

## 2014-06-08 NOTE — Patient Instructions (Signed)
If your symptoms are not completely gone within 1-2 weeks, refill the cipro and take for a total of 6 weeks.

## 2014-06-08 NOTE — Assessment & Plan Note (Signed)
Urinalysis normal Typical symptoms for prostatitis  He has had numerous past infections but none in some years Will treat with 3 weeks of cipro (he did well with this in past)---but extend to 6 weeks prn

## 2014-06-08 NOTE — Progress Notes (Signed)
Pre visit review using our clinic review tool, if applicable. No additional management support is needed unless otherwise documented below in the visit note. 

## 2014-06-08 NOTE — Progress Notes (Signed)
Subjective:    Patient ID: Joshua Lang, male    DOB: [birth year], 59 y.o.   MRN: [id number]  HPI Here due to prostate infection  He has had these before and has the same symptoms Periodically gets them since the late 1990's Has a irritation feeling when voiding Some urgency and increased voiding Feels this started ~3 weeks ago  Feels tired and general discomfort Up every 30 minutes at night No fever No blood in urine  Monogamous with single partner Generally doesn't use condoms Last intercourse 3 weeks ago---no pain and no hematospermia that he knows of  Current Outpatient Prescriptions on File Prior to Visit  Medication Sig Dispense Refill  . albuterol (PROVENTIL HFA;VENTOLIN HFA) 108 (90 BASE) MCG/ACT inhaler Inhale 2 puffs into the lungs every 6 (six) hours as needed for wheezing or shortness of breath. 1 Inhaler 2  . Ascorbic Acid (VITAMIN C WITH ROSE HIPS) 1000 MG tablet Take 1,000 mg by mouth 2 (two) times daily.    Marland Kitchen aspirin 81 MG tablet Take 81 mg by mouth daily.      . B Complex-C (SUPER B COMPLEX) TABS Take 1 tablet by mouth 2 (two) times daily.     . cholecalciferol (VITAMIN D) 1000 UNITS tablet Take 2,000 Units by mouth daily.      . fish oil-omega-3 fatty acids 1000 MG capsule Take 2 g by mouth daily.      . fluocinonide cream (LIDEX) 0.05 % Apply bid as directed 60 g 3  . fluticasone (FLONASE) 50 MCG/ACT nasal spray Place 2 sprays into both nostrils daily. 16 g 11  . Glucosamine-Chondroit-Vit C-Mn CAPS Take 1 capsule by mouth 2 (two) times daily.     Marland Kitchen ketotifen (ZADITOR) 0.025 % ophthalmic solution Place 2 drops into both eyes at bedtime. 5 mL 11  . Misc Natural Products (RED WINE EXTRACT PLUS) CAPS Take 1 capsule by mouth 2 (two) times daily.     . Multiple Vitamin (MULTIVITAMIN) tablet Take 1 tablet by mouth daily.      . polycarbophil (FIBERCON) 625 MG tablet Take 1,875 mg by mouth 2 (two) times daily.    . Probiotic Product (PROBIOTIC DAILY PO) Take 1 capsule  by mouth at bedtime.    . SAW PALMETTO, SERENOA REPENS, PO Take 1 tablet by mouth 2 (two) times daily.      . sildenafil (REVATIO) 20 MG tablet Take 2 to 5 tablets by mouth 30 minutes prior to intercourse. 50 tablet 11  . simvastatin (ZOCOR) 40 MG tablet Take 1 tablet (40 mg total) by mouth at bedtime. 30 tablet 11   No current facility-administered medications on file prior to visit.    Allergies  Allergen Reactions  . Oxycodone Itching    And hyper feeling.    Past Medical History  Diagnosis Date  . Colonic polyp   . Nephrolithiasis   . Hypercholesteremia   . 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 ED (erectile dysfunction)   . Allergic rhinitis   . Enlarged prostate     Moderately    Past Surgical History  Procedure Laterality Date  . Shoulder surgery  2009    open, 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, (Jim Hooten)  . Total hip arthroplasty  09/2009    (Hooten)  . Shoulder open rotator cuff repair  2014    Hooten, partial RTC tear with probably SAD, possible DCE    Family History  Problem Relation Age of Onset  .  Prostate cancer Father   . Migraines Sister     History   Social History  . Marital Status: Single    Spouse Name: N/A    Number of Children: N/A  . Years of Education: N/A   Occupational History  . [employer], [employer], head     Doctorate   Social History Main Topics  . Smoking status: Never Smoker   . Smokeless tobacco: Never Used  . Alcohol Use: Yes     Comment: occassional beer  . Drug Use: No  . Sexual Activity: Not on file   Other Topics Concern  . Not on file   Social History Narrative   Review of Systems No abdominal pain Slight nausea without vomiting Appetite is okay    Objective:   Physical Exam  Constitutional: He appears well-developed and well-nourished. No distress.  Genitourinary:  No inflammation in scrotum--but mild left testicular tenderness Urethra looks normal Prostate is boggy and enlarged without nodules---moderate tenderness           Assessment & Plan:

## 2014-06-28 ENCOUNTER — Other Ambulatory Visit: Payer: Self-pay | Admitting: Family Medicine

## 2014-06-28 ENCOUNTER — Telehealth: Payer: Self-pay

## 2014-06-28 NOTE — Telephone Encounter (Signed)
Pt was seen 06/08/14 and has 2 more Cipro to take and then will be completed taking abx.pt said his urge to urinate is better but not completely gone but pt feels a lot better and is not as tired and is getting more rest at night. Pt is not sure since still having some urge to urinate at night should pt refill Cipro or not. Advised from AVS that if symptoms not completely gone in 1 -2 weeks to refill Cipro for total of 6 weeks of med. Pt said he is so much better but not quite cleared he cannot decide if should refill Cipro or not and request Dr Alla German guidance.Please advise.

## 2014-06-28 NOTE — Telephone Encounter (Signed)
Let him know that he definitely should refill the cipro and take another 3 weeks. His chance of recurrence otherwise will be quite high

## 2014-06-29 NOTE — Telephone Encounter (Signed)
Left detailed message on voicemail for pt to refill the cipro.

## 2014-07-04 ENCOUNTER — Ambulatory Visit (INDEPENDENT_AMBULATORY_CARE_PROVIDER_SITE_OTHER): Payer: BC Managed Care – PPO | Admitting: Family Medicine

## 2014-07-04 ENCOUNTER — Encounter: Payer: Self-pay | Admitting: Family Medicine

## 2014-07-04 VITALS — BP 114/80 | HR 73 | Temp 97.8°F | Ht 73.0 in | Wt 213.0 lb

## 2014-07-04 DIAGNOSIS — E785 Hyperlipidemia, unspecified: Secondary | ICD-10-CM

## 2014-07-04 DIAGNOSIS — Z79899 Other long term (current) drug therapy: Secondary | ICD-10-CM

## 2014-07-04 DIAGNOSIS — Z125 Encounter for screening for malignant neoplasm of prostate: Secondary | ICD-10-CM

## 2014-07-04 DIAGNOSIS — Z1211 Encounter for screening for malignant neoplasm of colon: Secondary | ICD-10-CM

## 2014-07-04 DIAGNOSIS — Z0000 Encounter for general adult medical examination without abnormal findings: Secondary | ICD-10-CM

## 2014-07-04 MED ORDER — FLUOCINONIDE 0.05 % EX CREA: TOPICAL_CREAM | CUTANEOUS | Status: DC

## 2014-07-04 MED ORDER — SIMVASTATIN 40 MG PO TABS: 40.0000 mg | ORAL_TABLET | Freq: Every day | ORAL | Status: DC

## 2014-07-04 MED ORDER — VARDENAFIL HCL 20 MG PO TABS: 20.0000 mg | ORAL_TABLET | Freq: Every day | ORAL | Status: DC | PRN

## 2014-07-04 NOTE — Progress Notes (Signed)
Dr. Frederico Hamman T. Daja Shuping, MD, [facility] [place], [address] Phone: [phone] Fax: [phone]  07/04/2014  Patient: Joshua Lang, MRN: [id number], DOB: 09-Feb-1956, 59 y.o.  Primary Physician:  Owens Loffler, MD  Chief Complaint: Annual Exam  Subjective:   Joshua Lang is a 59 y.o. pleasant patient who presents with the following:  Preventative Health Maintenance Visit:  Health Maintenance Summary Reviewed and updated, unless pt declines services.  Tobacco History Reviewed. Alcohol: No concerns, no excessive use Exercise Habits: Some activity, rec at least 30 mins 5 times a week STD concerns: no risk or activity to increase risk Drug Use: None Encouraged self-testicular check  Retired in July.   R RTC repair last Christmas. Saw Marylee Floras. Shoulder pain is improving.  07/10/2014 - R THR, L hip has been bothering him a lot. Has f/u with Jenny Reichmann.  Now with 6 weeks of ABX. Intermittent ABX. 20 oz container of water, then in the afternoon and with meals.    Health Maintenance  Topic Date Due  . HIV Screening  10/08/1971  . INFLUENZA VACCINE  12/10/2014  . COLONOSCOPY  12/09/2017  . TETANUS/TDAP  02/14/2019   Immunization History  Administered Date(s) Administered  . Influenza Whole 02/08/2009, 02/07/2012  . Influenza, Seasonal, Injecte, Preservative Fre 01/08/2014  . Td 02/13/2009  . Zoster 06/22/2013   Patient Active Problem List   Diagnosis Date Noted  . FATIGUE 03/12/2010  . ALLERGIC RHINITIS 02/17/2009  . 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 UNEQUAL LEG LENGTH 02/13/2009  . SLEEP APNEA 02/13/2009  . COLONIC POLYPS 10/30/2008  . HYPERCHOLESTEROLEMIA 10/30/2008  . NEPHROLITHIASIS 10/30/2008  . DEGENERATIVE JOINT DISEASE, HIPS 10/30/2008   Past Medical History  Diagnosis Date  . Colonic polyp   . Nephrolithiasis   . Hypercholesteremia   . 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 ED (erectile  dysfunction)   . Allergic rhinitis   . Enlarged prostate     Moderately   Past Surgical History  Procedure Laterality Date  . Shoulder surgery  2009    open, 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, (Jim Hooten)  . Total hip arthroplasty  09/2009    (Hooten)  . Shoulder open rotator cuff repair  2014    Hooten, partial RTC tear with probably SAD, possible DCE   History   Social History  . Marital Status: Single    Spouse Name: N/A  . Number of Children: N/A  . Years of Education: N/A   Occupational History  . [employer], [employer], head     Doctorate   Social History Main Topics  . Smoking status: Never Smoker   . Smokeless tobacco: Never Used  . Alcohol Use: Yes     Comment: occassional beer  . Drug Use: No  . Sexual Activity: Not on file   Other Topics Concern  . Not on file   Social History Narrative   Family History  Problem Relation Age of Onset  . Prostate cancer Father   . Migraines Sister    Allergies  Allergen Reactions  . Oxycodone Itching    And hyper feeling.    Medication list has been reviewed and updated.   General: Denies fever, chills, sweats. No significant weight loss. Eyes: Denies blurring,significant itching ENT: Denies earache, sore throat, and hoarseness. Cardiovascular: Denies chest pains, palpitations, dyspnea on exertion Respiratory: Denies cough, dyspnea at rest,wheeezing Breast: no concerns about lumps GI: Denies  nausea, vomiting, diarrhea, constipation, change in bowel habits, abdominal pain, melena, hematochezia GU: RESOLVING RECENT PROSTATITIS Musculoskeletal: HIP PAIN Derm: Denies rash, itching Neuro: Denies  paresthesias, frequent falls, frequent headaches Psych: Denies depression, anxiety Endocrine: Denies cold intolerance, heat intolerance, polydipsia Heme: Denies enlarged lymph nodes Allergy: No hayfever  Objective:   BP 114/80 mmHg  Pulse 73  Temp(Src) 97.8 F (36.6 C) (Oral)  Ht 6' 1"  (1.854 m)  Wt 213 lb (96.616 kg)  BMI  28.11 kg/m2 Ideal Body Weight: Weight in (lb) to have BMI = 25: 189.1  No exam data present  GEN: well developed, well nourished, no acute distress Eyes: conjunctiva and lids normal, PERRLA, EOMI ENT: TM clear, nares clear, oral exam WNL Neck: supple, no lymphadenopathy, no thyromegaly, no JVD Pulm: clear to auscultation and percussion, respiratory effort normal CV: regular rate and rhythm, S1-S2, no murmur, rub or gallop, no bruits, peripheral pulses normal and symmetric, no cyanosis, clubbing, edema or varicosities GI: soft, non-tender; no hepatosplenomegaly, masses; active bowel sounds all quadrants [id number]  Lymph: no cervical, axillary or inguinal adenopathy MSK: mildly antalgic gait, muscle tone and strength WNL, no joint swelling, effusions, discoloration, crepitus  SKIN: clear, good turgor, color WNL, no rashes, lesions, or ulcerations Neuro: normal mental status, normal strength, sensation, and motion Psych: alert; oriented to person, place and time, normally interactive and not anxious or depressed in appearance.  All labs reviewed with patient.  Lipids:    Component Value Date/Time   CHOL 153 06/22/2013 0927   TRIG 72.0 06/22/2013 0927   HDL 40.60 06/22/2013 0927   VLDL 14.4 06/22/2013 0927   CHOLHDL 4 06/22/2013 0927   CBC: CBC Latest Ref Rng 06/22/2013 04/06/2012 03/18/2011  WBC 4.5 - 10.5 K/uL 5.5 5.4 4.8  Hemoglobin 13.0 - 17.0 g/dL 15.7 16.2 16.4  Hematocrit 39.0 - 52.0 % 47.7 48.7 47.9  Platelets 150.0 - 400.0 K/uL 235.0 187.0 062.3    Basic Metabolic Panel:    Component Value Date/Time   NA 141 06/22/2013 0927   K 4.8 06/22/2013 0927   CL 105 06/22/2013 0927   CO2 29 06/22/2013 0927   BUN 15 06/22/2013 0927   CREATININE 0.9 06/22/2013 0927   GLUCOSE 96 06/22/2013 0927   CALCIUM 9.7 06/22/2013 0927   Hepatic Function Latest Ref Rng 06/22/2013 04/06/2012 03/18/2011  Total Protein 6.0 - 8.3 g/dL 6.9 7.4 7.2  Albumin 3.5 - 5.2 g/dL 4.2 4.3 4.4  AST 0 -  37 U/L 25 29 25   ALT 0 - 53 U/L 34 36 29  Alk Phosphatase 39 - 117 U/L 58 59 52  Total Bilirubin 0.3 - 1.2 mg/dL 0.9 0.6 0.8  Bilirubin, Direct 0.0 - 0.3 mg/dL 0.1 0.1 0.1    Lab Results  Component Value Date   TSH 1.23 03/12/2010   Lab Results  Component Value Date   PSA 0.83 06/22/2013   PSA 0.74 04/06/2012   PSA 0.75 03/12/2010    Assessment and Plan:   Healthcare maintenance  Special screening for malignant neoplasms, colon  Screening for prostate cancer - Plan: PSA  Hyperlipidemia - Plan: Lipid panel  Encounter for long-term (current) use of medications - Plan: Basic metabolic panel, CBC with Differential/Platelet, Hepatic function panel  Health Maintenance Exam: The patient's preventative maintenance and recommended screening tests for an annual wellness exam were reviewed in full today. Brought up to date unless services declined.  Counselled on the importance of diet, exercise, and its role in overall health and mortality. The  patient's FH and SH was reviewed, including their home life, tobacco status, and drug and alcohol status.  Check labs, basically doing ok overall. Answered all questions.   Follow-up: No Follow-up on file. Unless noted, follow-up in 1 year for Health Maintenance Exam.  New Prescriptions   No medications on file   Orders Placed This Encounter  Procedures  . Basic metabolic panel  . CBC with Differential/Platelet  . Hepatic function panel  . Lipid panel  . PSA    Signed,  Frederico Hamman T. Brodey Bonn, MD   Patient's Medications  New Prescriptions   No medications on file  Previous Medications   ASCORBIC ACID (VITAMIN C WITH ROSE HIPS) 1000 MG TABLET    Take 1,000 mg by mouth 2 (two) times daily.   ASPIRIN 81 MG TABLET    Take 81 mg by mouth daily.     B COMPLEX-C (SUPER B COMPLEX) TABS    Take 1 tablet by mouth 2 (two) times daily.    CHOLECALCIFEROL (VITAMIN D) 1000 UNITS TABLET    Take 2,000 Units by mouth daily.     CIPROFLOXACIN  (CIPRO) 500 MG TABLET    Take 1 tablet (500 mg total) by mouth 2 (two) times daily.   FISH OIL-OMEGA-3 FATTY ACIDS 1000 MG CAPSULE    Take 2 g by mouth daily.     FLUTICASONE (FLONASE) 50 MCG/ACT NASAL SPRAY    instill 2 sprays into each nostril once daily   GLUCOSAMINE-CHONDROIT-VIT C-MN CAPS    Take 1 capsule by mouth 2 (two) times daily.    MISC NATURAL PRODUCTS (RED WINE EXTRACT PLUS) CAPS    Take 1 capsule by mouth 2 (two) times daily.    MULTIPLE VITAMIN (MULTIVITAMIN) TABLET    Take 1 tablet by mouth daily.     POLYCARBOPHIL (FIBERCON) 625 MG TABLET    Take 1,875 mg by mouth 2 (two) times daily.   PROBIOTIC PRODUCT (PROBIOTIC DAILY PO)    Take 1 capsule by mouth at bedtime.   SAW PALMETTO 450 MG CAPS    Take 1 capsule by mouth daily.   SILDENAFIL (REVATIO) 20 MG TABLET    Take 2 to 5 tablets by mouth 30 minutes prior to intercourse.  Modified Medications   Modified Medication Previous Medication   FLUOCINONIDE CREAM (LIDEX) 0.05 % fluocinonide cream (LIDEX) 0.05 %      Apply bid as directed    Apply bid as directed   SIMVASTATIN (ZOCOR) 40 MG TABLET simvastatin (ZOCOR) 40 MG tablet      Take 1 tablet (40 mg total) by mouth at bedtime.    Take 1 tablet (40 mg total) by mouth at bedtime.   VARDENAFIL (LEVITRA) 20 MG TABLET vardenafil (LEVITRA) 20 MG tablet      Take 1 tablet (20 mg total) by mouth daily as needed for erectile dysfunction.    Take 20 mg by mouth daily as needed for erectile dysfunction.  Discontinued Medications   ALBUTEROL (PROVENTIL HFA;VENTOLIN HFA) 108 (90 BASE) MCG/ACT INHALER    Inhale 2 puffs into the lungs every 6 (six) hours as needed for wheezing or shortness of breath.   KETOTIFEN (ZADITOR) 0.025 % OPHTHALMIC SOLUTION    Place 2 drops into both eyes at bedtime.   SAW PALMETTO, SERENOA REPENS, PO    Take 1 tablet by mouth 2 (two) times daily.

## 2014-07-04 NOTE — Progress Notes (Signed)
Pre visit review using our clinic review tool, if applicable. No additional management support is needed unless otherwise documented below in the visit note. 

## 2014-07-05 ENCOUNTER — Encounter: Payer: Self-pay | Admitting: *Deleted

## 2014-07-05 LAB — BASIC METABOLIC PANEL
BUN: 14 mg/dL (ref 6–23)
CHLORIDE: 101 meq/L (ref 96–112)
CO2: 30 meq/L (ref 19–32)
Calcium: 10 mg/dL (ref 8.4–10.5)
Creatinine, Ser: 0.97 mg/dL (ref 0.40–1.50)
GFR: 84.57 mL/min (ref >60.00)
GLUCOSE: 91 mg/dL (ref 70–99)
POTASSIUM: 4.3 meq/L (ref 3.5–5.1)
Sodium: 138 mEq/L (ref 135–145)

## 2014-07-05 LAB — HEPATIC FUNCTION PANEL
ALBUMIN: 4.7 g/dL (ref 3.5–5.2)
ALT: 24 U/L (ref 0–53)
AST: 25 U/L (ref 0–37)
Alkaline Phosphatase: 66 U/L (ref 39–117)
BILIRUBIN TOTAL: 0.6 mg/dL (ref 0.2–1.2)
Bilirubin, Direct: 0.1 mg/dL (ref 0.0–0.3)
Total Protein: 7.6 g/dL (ref 6.0–8.3)

## 2014-07-05 LAB — CBC WITH DIFFERENTIAL/PLATELET
Basophils Absolute: 0 10*3/uL (ref 0.0–0.1)
Basophils Relative: 0.3 % (ref 0.0–3.0)
EOS ABS: 0 10*3/uL (ref 0.0–0.7)
Eosinophils Relative: 0.8 % (ref 0.0–5.0)
HCT: 46.7 % (ref 39.0–52.0)
Hemoglobin: 16.2 g/dL (ref 13.0–17.0)
Lymphocytes Relative: 26.5 % (ref 12.0–46.0)
Lymphs Abs: 1.5 10*3/uL (ref 0.7–4.0)
MCHC: 34.7 g/dL (ref 30.0–36.0)
MCV: 94 fl (ref 78.0–100.0)
Monocytes Absolute: 0.5 10*3/uL (ref 0.1–1.0)
Monocytes Relative: 8.5 % (ref 3.0–12.0)
NEUTROS PCT: 63.9 % (ref 43.0–77.0)
Neutro Abs: 3.6 10*3/uL (ref 1.4–7.7)
PLATELETS: 211 10*3/uL (ref 150.0–400.0)
RBC: 4.97 Mil/uL (ref 4.22–5.81)
RDW: 14 % (ref 11.5–15.5)
WBC: 5.6 10*3/uL (ref 4.0–10.5)

## 2014-07-05 LAB — LIPID PANEL
Cholesterol: 177 mg/dL (ref 0–200)
HDL: 57 mg/dL (ref >39.00)
LDL CALC: 101 mg/dL — AB (ref 0–99)
NONHDL: 120
Total CHOL/HDL Ratio: 3
Triglycerides: 94 mg/dL (ref 0.0–149.0)
VLDL: 18.8 mg/dL (ref 0.0–40.0)

## 2014-07-05 LAB — PSA: PSA: 0.79 ng/mL (ref 0.10–4.00)

## 2014-08-31 NOTE — Op Note (Signed)
PATIENT NAME:  Joshua Lang, Joshua Lang MR#:  [id number] DATE OF BIRTH:  1955/08/30  DATE OF PROCEDURE:  04/26/2013  PREOPERATIVE DIAGNOSIS: Right rotator cuff tear.   POSTOPERATIVE DIAGNOSIS: Right rotator cuff tear (supraspinatus).   PROCEDURE PERFORMED: Right subacromial decompression and rotator cuff repair.   SURGEON: Skip Estimable, MD.   ANESTHESIA: General.   ESTIMATED BLOOD LOSS: Minimal.   FLUIDS REPLACED: 1800 mL of crystalloid.   DRAINS: None.   IMPLANTS UTILIZED: ArthroCare 5.5 mm Spartan PEEK suture anchor with associated #2 Magnum wire sutures.    INDICATIONS FOR SURGERY: The patient is a 59 year old male, who has been seen for complaints of right shoulder pain. He has undergone an extensive course of conservative treatment and was still having difficulty with right shoulder pain, especially with elevation. MRI demonstrated findings consistent with a tear of the supraspinatus tendon. After discussion of the risks and benefits of surgical intervention, the patient expressed understanding of the risks and benefits and agreed with plans for surgical intervention.   PROCEDURE IN DETAIL: The patient was brought into the operating room and then, after adequate general anesthesia was achieved, the patient was placed in the modified beach chair position. The head was secured in a headrest and all bony prominences were well padded. The patient's right shoulder and arm were cleaned and prepped with alcohol and DuraPrep and draped in the usual sterile fashion. A "timeout" was performed as per usual protocol. The anticipated incision site was injected with 0.25% Marcaine with epinephrine. An anterior oblique incision was made roughly bisecting the anterior aspect of the acromion. The fibers of deltoid were split in line and a "deltoid on" approach was utilized by elevating the deltoid in a subperiosteal fashion off of the acromion. The subdeltoid bursa was excised. The anterior/inferior margin of the  acromion was noted to have sharp subacromial spurring. A Darrach retractor was inserted so as to protect the rotator cuff. An osteotome was used to remove approximately 3 o 4 mm thickness wedge of bone from the anterior/inferior aspect of the acromion. Additional debridement and contouring was performed using a TPS high-speed rasp. The wound was irrigated with copious amounts of normal saline with antibiotic solution and then suctioned dry. Shoulder was placed through a range of motion. The posterior margin of the supraspinatus was still intact. However, there was a full-thickness tear of the anterior aspect of the supraspinatus. The articular surface was in good condition. The footprint of the attachment along the greater tuberosity was debrided and a small bony trough was created using rongeurs. A 5.5 mm Spartan PEEK suture anchor was inserted and after mobilization of the supraspinatus, repair was performed using the associated #2 Magnum wire sutures. This allowed for coverage of almost all of the humeral head, although slight opening was still noted between the biceps tendon/subscapularis tendon due to some of the damage along the anterior most portion of the supraspinatus tendon.   The shoulder was placed through a range of motion with good maintenance of the repair. Adequate decompression had been accomplished. The wound was irrigated with copious amounts of normal saline with antibiotic solution. Good hemostasis was noted. The deltoid was repaired in a side-to-side fashion using interrupted sutures of #1 Ethibond. The subcutaneous tissue was approximated in layers using first #0 Vicryl followed 2-0 Vicryl. Subcuticular closure was performed using a running suture of 4-0 Vicryl. Steri-Strips were applied. An additional 25 mL of 0.25% Marcaine with epinephrine was injected along the incision site and in the subacromial region. A  sterile dressing was applied. The patient tolerated the procedure well. He was  transported to the recovery room in stable condition. ____________________________ Laurice Record. Holley Bouche., MD [email or web address] D: 04/27/2013 06:23:16 ET T: 04/27/2013 06:50:43 ET JOB#: [id number]  cc: Laurice Record. Holley Bouche., MD, <Dictator> Laurice Record Holley Bouche MD ELECTRONICALLY SIGNED 04/30/2013 12:53

## 2015-04-29 ENCOUNTER — Encounter
Admission: RE | Admit: 2015-04-29 | Discharge: 2015-04-29 | Disposition: A | Discharge status: Home / Self Care | Payer: BC Managed Care – PPO | Source: Ambulatory Visit | Attending: Orthopedic Surgery | Admitting: Orthopedic Surgery

## 2015-04-29 DIAGNOSIS — Z01812 Encounter for preprocedural laboratory examination: Secondary | ICD-10-CM | POA: Diagnosis present

## 2015-04-29 DIAGNOSIS — Z01810 Encounter for preprocedural cardiovascular examination: Secondary | ICD-10-CM | POA: Diagnosis present

## 2015-04-29 HISTORY — DX: Gastro-esophageal reflux disease without esophagitis: K21.9

## 2015-04-29 LAB — CBC
HEMATOCRIT: 46.6 % (ref 40.0–52.0)
HEMOGLOBIN: 15.5 g/dL (ref 13.0–18.0)
MCH: 31.9 pg (ref 26.0–34.0)
MCHC: 33.2 g/dL (ref 32.0–36.0)
MCV: 95.9 fL (ref 80.0–100.0)
PLATELETS: 171 10*3/uL (ref 150–440)
RBC: 4.85 MIL/uL (ref 4.40–5.90)
RDW: 13.9 % (ref 11.5–14.5)
WBC: 4.6 10*3/uL (ref 3.8–10.6)

## 2015-04-29 LAB — URINALYSIS COMPLETE WITH MICROSCOPIC (ARMC ONLY)
BILIRUBIN URINE: NEGATIVE
Bacteria, UA: NONE SEEN
GLUCOSE, UA: NEGATIVE mg/dL
HGB URINE DIPSTICK: NEGATIVE
Ketones, ur: NEGATIVE mg/dL
LEUKOCYTES UA: NEGATIVE
Nitrite: NEGATIVE
Protein, ur: NEGATIVE mg/dL
SQUAMOUS EPITHELIAL / LPF: NONE SEEN
Specific Gravity, Urine: 1.025 (ref 1.005–1.030)
pH: 6 (ref 5.0–8.0)

## 2015-04-29 LAB — TYPE AND SCREEN
ABO/RH(D): O NEG
ANTIBODY SCREEN: NEGATIVE

## 2015-04-29 LAB — BASIC METABOLIC PANEL
ANION GAP: 6 (ref 5–15)
BUN: 20 mg/dL (ref 6–20)
CALCIUM: 9.2 mg/dL (ref 8.9–10.3)
CO2: 28 mmol/L (ref 22–32)
Chloride: 106 mmol/L (ref 101–111)
Creatinine, Ser: 0.83 mg/dL (ref 0.61–1.24)
GFR calc non Af Amer: >60 mL/min (ref >60)
Glucose, Bld: 96 mg/dL (ref 65–99)
Potassium: 3.9 mmol/L (ref 3.5–5.1)
Sodium: 140 mmol/L (ref 135–145)

## 2015-04-29 LAB — PROTIME-INR
INR: 0.99
Prothrombin Time: 13.3 seconds (ref 11.4–15.0)

## 2015-04-29 LAB — ABO/RH: ABO/RH(D): O NEG

## 2015-04-29 LAB — SURGICAL PCR SCREEN
MRSA, PCR: NEGATIVE
STAPHYLOCOCCUS AUREUS: NEGATIVE

## 2015-04-29 LAB — APTT: APTT: 31 s (ref 24–36)

## 2015-04-29 LAB — SEDIMENTATION RATE: Sed Rate: 6 mm/hr (ref 0–20)

## 2015-04-29 NOTE — Patient Instructions (Addendum)
  Your procedure is scheduled on: Monday 05/20/2015 Report to Day Surgery. 2ND FLOOR MEDICAL MALL ENTRANCE To find out your arrival time please call [phone] between 1PM - 3PM on Friday 05/17/2015.  Remember: Instructions that are not followed completely may result in serious medical risk, up to and including death, or upon the discretion of your surgeon and anesthesiologist your surgery may need to be rescheduled.    __X__ 1. Do not eat food or drink liquids after midnight. No gum chewing or hard candies.     __X__ 2. No Alcohol for 24 hours before or after surgery.   ____ 3. Bring all medications with you on the day of surgery if instructed.    __X__ 4. Notify your doctor if there is any change in your medical condition     (cold, fever, infections).     Do not wear jewelry, make-up, hairpins, clips or nail polish.  Do not wear lotions, powders, or perfumes. .  Do not shave 48 hours prior to surgery. Men may shave face and neck.  Do not bring valuables to the hospital.    [facility] is not responsible for any belongings or valuables.               Contacts, dentures or bridgework may not be worn into surgery.  Leave your suitcase in the car. After surgery it may be brought to your room.  For patients admitted to the hospital, discharge time is determined by your                treatment team.   Patients discharged the day of surgery will not be allowed to drive home.   Please read over the following fact sheets that you were given:   MRSA Information and Surgical Site Infection Prevention   __X__ Take these medicines the morning of surgery with A SIP OF WATER:    1. PEPCID  2.   3.   4.  5.  6.  ____ Fleet Enema (as directed)   __X__ USE CHG SOAP AS INSTRUCTED  ____ Use inhalers on the day of surgery  ____ Stop metformin 2 days prior to surgery    ____ Take 1/2 of usual insulin dose the night before surgery and none on the morning of surgery.   __X__ Stop  Coumadin/Plavix/aspirin on 05/13/2015  __X__ Stop Anti-inflammatories on 05/13/2015   __X__ Stop supplements until after surgery.  OK TO CONTINUE CALCIUM, VITAMIN D, MULTIVITAMIN, FIBER, PROBIOTIC  ____ Bring C-Pap to the hospital.

## 2015-05-01 LAB — URINE CULTURE: CULTURE: NO GROWTH

## 2015-05-02 ENCOUNTER — Other Ambulatory Visit: Payer: BC Managed Care – PPO

## 2015-05-20 ENCOUNTER — Encounter
Admission: EM | Disposition: A | Discharge status: Home / Self Care | Payer: Self-pay | Source: Ambulatory Visit | Attending: Orthopedic Surgery

## 2015-05-20 ENCOUNTER — Encounter: Payer: Self-pay | Admitting: *Deleted

## 2015-05-20 ENCOUNTER — Inpatient Hospital Stay
Admission: EM | Admit: 2015-05-20 | Discharge: 2015-05-22 | DRG: 470 | Disposition: A | Discharge status: Home / Self Care | Payer: BC Managed Care – PPO | Source: Ambulatory Visit | Attending: Orthopedic Surgery | Admitting: Orthopedic Surgery

## 2015-05-20 ENCOUNTER — Inpatient Hospital Stay: Payer: BC Managed Care – PPO

## 2015-05-20 ENCOUNTER — Inpatient Hospital Stay: Payer: BC Managed Care – PPO | Admitting: Anesthesiology

## 2015-05-20 DIAGNOSIS — Z7982 Long term (current) use of aspirin: Secondary | ICD-10-CM | POA: Diagnosis not present

## 2015-05-20 DIAGNOSIS — Z96641 Presence of right artificial hip joint: Secondary | ICD-10-CM | POA: Diagnosis present

## 2015-05-20 DIAGNOSIS — Z9889 Other specified postprocedural states: Secondary | ICD-10-CM | POA: Diagnosis not present

## 2015-05-20 DIAGNOSIS — Z87442 Personal history of urinary calculi: Secondary | ICD-10-CM | POA: Diagnosis not present

## 2015-05-20 DIAGNOSIS — Z96649 Presence of unspecified artificial hip joint: Secondary | ICD-10-CM

## 2015-05-20 DIAGNOSIS — K219 Gastro-esophageal reflux disease without esophagitis: Secondary | ICD-10-CM | POA: Diagnosis present

## 2015-05-20 DIAGNOSIS — Z86010 Personal history of colonic polyps: Secondary | ICD-10-CM

## 2015-05-20 DIAGNOSIS — M1612 Unilateral primary osteoarthritis, left hip: Principal | ICD-10-CM | POA: Diagnosis present

## 2015-05-20 DIAGNOSIS — Z7951 Long term (current) use of inhaled steroids: Secondary | ICD-10-CM

## 2015-05-20 DIAGNOSIS — M169 Osteoarthritis of hip, unspecified: Secondary | ICD-10-CM | POA: Diagnosis present

## 2015-05-20 DIAGNOSIS — Z79899 Other long term (current) drug therapy: Secondary | ICD-10-CM

## 2015-05-20 HISTORY — PX: TOTAL HIP ARTHROPLASTY: SHX124

## 2015-05-20 LAB — TYPE AND SCREEN
ABO/RH(D): O NEG
ANTIBODY SCREEN: NEGATIVE

## 2015-05-20 SURGERY — ARTHROPLASTY, HIP, TOTAL,POSTERIOR APPROACH
Anesthesia: Spinal | Site: Hip | Laterality: Left | Wound class: Clean

## 2015-05-20 MED ORDER — SAW PALMETTO 450 MG PO CAPS: 1.0000 | ORAL_CAPSULE | Freq: Every day | ORAL | Status: DC

## 2015-05-20 MED ORDER — SIMVASTATIN 40 MG PO TABS
40.0000 mg | ORAL_TABLET | Freq: Every day | ORAL | Status: DC
  Administered 2015-05-21: 40 mg via ORAL
  Filled 2015-05-20 (×2): qty 1

## 2015-05-20 MED ORDER — ACETAMINOPHEN 10 MG/ML IV SOLN
1000.0000 mg | Freq: Four times a day (QID) | INTRAVENOUS | Status: AC
  Administered 2015-05-20 – 2015-05-21 (×4): 1000 mg via INTRAVENOUS
  Filled 2015-05-20 (×4): qty 100

## 2015-05-20 MED ORDER — PHENYLEPHRINE HCL 10 MG/ML IJ SOLN
INTRAMUSCULAR | Status: DC | PRN
  Administered 2015-05-20 (×3): 100 ug via INTRAVENOUS

## 2015-05-20 MED ORDER — ACETAMINOPHEN 10 MG/ML IV SOLN
INTRAVENOUS | Status: AC
  Filled 2015-05-20: qty 100

## 2015-05-20 MED ORDER — ONE-DAILY MULTI VITAMINS PO TABS: 1.0000 | ORAL_TABLET | Freq: Every day | ORAL | Status: DC

## 2015-05-20 MED ORDER — ONDANSETRON HCL 4 MG/2ML IJ SOLN
4.0000 mg | Freq: Once | INTRAMUSCULAR | Status: AC | PRN
  Administered 2015-05-20: 4 mg via INTRAVENOUS

## 2015-05-20 MED ORDER — FLUTICASONE PROPIONATE 50 MCG/ACT NA SUSP
2.0000 | Freq: Every day | NASAL | Status: DC | PRN
  Filled 2015-05-20: qty 16

## 2015-05-20 MED ORDER — FLEET ENEMA 7-19 GM/118ML RE ENEM: 1.0000 | ENEMA | Freq: Once | RECTAL | Status: DC | PRN

## 2015-05-20 MED ORDER — FERROUS SULFATE 325 (65 FE) MG PO TABS
325.0000 mg | ORAL_TABLET | Freq: Two times a day (BID) | ORAL | Status: DC
  Administered 2015-05-20 – 2015-05-22 (×3): 325 mg via ORAL
  Filled 2015-05-20 (×4): qty 1

## 2015-05-20 MED ORDER — ADULT MULTIVITAMIN W/MINERALS CH: 1.0000 | ORAL_TABLET | Freq: Two times a day (BID) | ORAL | Status: DC

## 2015-05-20 MED ORDER — FLUTICASONE PROPIONATE 50 MCG/ACT NA SUSP
2.0000 | Freq: Every day | NASAL | Status: DC
  Filled 2015-05-20: qty 16

## 2015-05-20 MED ORDER — VITAMIN C 500 MG PO TABS: 1000.0000 mg | ORAL_TABLET | Freq: Two times a day (BID) | ORAL | Status: DC

## 2015-05-20 MED ORDER — MENTHOL 3 MG MT LOZG: 1.0000 | LOZENGE | OROMUCOSAL | Status: DC | PRN

## 2015-05-20 MED ORDER — NEOMYCIN-POLYMYXIN B GU 40-200000 IR SOLN
Status: DC | PRN
  Administered 2015-05-20: 14 mL

## 2015-05-20 MED ORDER — ONDANSETRON HCL 4 MG/2ML IJ SOLN
INTRAMUSCULAR | Status: AC
  Administered 2015-05-20: 4 mg via INTRAVENOUS
  Filled 2015-05-20: qty 2

## 2015-05-20 MED ORDER — ACETAMINOPHEN 325 MG PO TABS
650.0000 mg | ORAL_TABLET | Freq: Four times a day (QID) | ORAL | Status: DC | PRN
  Administered 2015-05-21 – 2015-05-22 (×2): 650 mg via ORAL
  Filled 2015-05-20 (×2): qty 2

## 2015-05-20 MED ORDER — CALCIUM POLYCARBOPHIL 625 MG PO TABS
1875.0000 mg | ORAL_TABLET | Freq: Two times a day (BID) | ORAL | Status: DC
  Administered 2015-05-21 – 2015-05-22 (×3): 1875 mg via ORAL
  Filled 2015-05-20 (×6): qty 3

## 2015-05-20 MED ORDER — LACTATED RINGERS IV SOLN
INTRAVENOUS | Status: DC
  Administered 2015-05-20 (×2): via INTRAVENOUS

## 2015-05-20 MED ORDER — CEFAZOLIN SODIUM-DEXTROSE 2-3 GM-% IV SOLR
2.0000 g | Freq: Four times a day (QID) | INTRAVENOUS | Status: AC
  Administered 2015-05-20 – 2015-05-21 (×4): 2 g via INTRAVENOUS
  Filled 2015-05-20 (×4): qty 50

## 2015-05-20 MED ORDER — PROPOFOL 500 MG/50ML IV EMUL
INTRAVENOUS | Status: DC | PRN
  Administered 2015-05-20: 70 ug/kg/min via INTRAVENOUS

## 2015-05-20 MED ORDER — SODIUM CHLORIDE 0.9 % IV SOLN
250.0000 mg | INTRAVENOUS | Status: DC | PRN
  Administered 2015-05-20: 7 ug/kg/min via INTRAVENOUS

## 2015-05-20 MED ORDER — PROPOFOL 10 MG/ML IV BOLUS
INTRAVENOUS | Status: DC | PRN
  Administered 2015-05-20: 50 mg via INTRAVENOUS
  Administered 2015-05-20 (×2): 20 mg via INTRAVENOUS

## 2015-05-20 MED ORDER — CELECOXIB 200 MG PO CAPS
200.0000 mg | ORAL_CAPSULE | Freq: Two times a day (BID) | ORAL | Status: DC
  Administered 2015-05-20 – 2015-05-22 (×5): 200 mg via ORAL
  Filled 2015-05-20 (×5): qty 1

## 2015-05-20 MED ORDER — TRAMADOL HCL 50 MG PO TABS
50.0000 mg | ORAL_TABLET | ORAL | Status: DC | PRN
  Administered 2015-05-20: 50 mg via ORAL
  Administered 2015-05-20 – 2015-05-21 (×2): 100 mg via ORAL
  Filled 2015-05-20: qty 2
  Filled 2015-05-20: qty 1
  Filled 2015-05-20: qty 2

## 2015-05-20 MED ORDER — KETOTIFEN FUMARATE 0.025 % OP SOLN: 2.0000 [drp] | Freq: Every day | OPHTHALMIC | Status: DC | PRN

## 2015-05-20 MED ORDER — ENOXAPARIN SODIUM 30 MG/0.3ML ~~LOC~~ SOLN
30.0000 mg | Freq: Two times a day (BID) | SUBCUTANEOUS | Status: DC
  Administered 2015-05-21 – 2015-05-22 (×3): 30 mg via SUBCUTANEOUS
  Filled 2015-05-20 (×3): qty 0.3

## 2015-05-20 MED ORDER — MIDAZOLAM HCL 5 MG/5ML IJ SOLN
INTRAMUSCULAR | Status: DC | PRN
  Administered 2015-05-20: 2 mg via INTRAVENOUS

## 2015-05-20 MED ORDER — ONDANSETRON HCL 4 MG PO TABS
4.0000 mg | ORAL_TABLET | Freq: Four times a day (QID) | ORAL | Status: DC | PRN
  Administered 2015-05-21 (×2): 4 mg via ORAL
  Filled 2015-05-20 (×3): qty 1

## 2015-05-20 MED ORDER — DIPHENHYDRAMINE HCL 12.5 MG/5ML PO ELIX: 12.5000 mg | ORAL_SOLUTION | ORAL | Status: DC | PRN

## 2015-05-20 MED ORDER — FENTANYL CITRATE (PF) 100 MCG/2ML IJ SOLN
INTRAMUSCULAR | Status: DC | PRN
  Administered 2015-05-20 (×2): 50 ug via INTRAVENOUS

## 2015-05-20 MED ORDER — ONDANSETRON HCL 4 MG/2ML IJ SOLN
4.0000 mg | Freq: Four times a day (QID) | INTRAMUSCULAR | Status: DC | PRN
  Administered 2015-05-20: 4 mg via INTRAVENOUS
  Filled 2015-05-20: qty 2

## 2015-05-20 MED ORDER — BISACODYL 10 MG RE SUPP
10.0000 mg | Freq: Every day | RECTAL | Status: DC | PRN
Start: 2015-05-20 — End: 2015-05-22
  Filled 2015-05-20: qty 1

## 2015-05-20 MED ORDER — ACETAMINOPHEN 650 MG RE SUPP: 650.0000 mg | Freq: Four times a day (QID) | RECTAL | Status: DC | PRN

## 2015-05-20 MED ORDER — ONDANSETRON HCL 4 MG/2ML IJ SOLN
INTRAMUSCULAR | Status: DC | PRN
  Administered 2015-05-20: 4 mg via INTRAVENOUS

## 2015-05-20 MED ORDER — EPHEDRINE SULFATE 50 MG/ML IJ SOLN
INTRAMUSCULAR | Status: DC | PRN
  Administered 2015-05-20: 5 mg via INTRAVENOUS
  Administered 2015-05-20: 10 mg via INTRAVENOUS
  Administered 2015-05-20: 5 mg via INTRAVENOUS

## 2015-05-20 MED ORDER — CEFAZOLIN SODIUM-DEXTROSE 2-3 GM-% IV SOLR
2.0000 g | Freq: Once | INTRAVENOUS | Status: AC
  Administered 2015-05-20: 2 g via INTRAVENOUS

## 2015-05-20 MED ORDER — CALCIUM CARBONATE-VITAMIN D 500-200 MG-UNIT PO TABS: 1.0000 | ORAL_TABLET | Freq: Two times a day (BID) | ORAL | Status: DC

## 2015-05-20 MED ORDER — OMEGA-3-ACID ETHYL ESTERS 1 G PO CAPS: 2.0000 g | ORAL_CAPSULE | Freq: Every day | ORAL | Status: DC

## 2015-05-20 MED ORDER — FENTANYL CITRATE (PF) 100 MCG/2ML IJ SOLN
25.0000 ug | INTRAMUSCULAR | Status: DC | PRN
  Administered 2015-05-20 (×3): 25 ug via INTRAVENOUS

## 2015-05-20 MED ORDER — METOCLOPRAMIDE HCL 10 MG PO TABS
10.0000 mg | ORAL_TABLET | Freq: Three times a day (TID) | ORAL | Status: AC
  Administered 2015-05-20 – 2015-05-22 (×8): 10 mg via ORAL
  Filled 2015-05-20 (×8): qty 1

## 2015-05-20 MED ORDER — TRANEXAMIC ACID 1000 MG/10ML IV SOLN
1000.0000 mg | Freq: Once | INTRAVENOUS | Status: AC
  Administered 2015-05-20: 1000 mg via INTRAVENOUS
  Filled 2015-05-20: qty 10

## 2015-05-20 MED ORDER — TAPENTADOL HCL 50 MG PO TABS
50.0000 mg | ORAL_TABLET | ORAL | Status: DC | PRN
  Administered 2015-05-20: 50 mg via ORAL
  Administered 2015-05-20: 100 mg via ORAL
  Administered 2015-05-20: 50 mg via ORAL
  Administered 2015-05-21 (×2): 100 mg via ORAL
  Filled 2015-05-20 (×2): qty 2
  Filled 2015-05-20: qty 1
  Filled 2015-05-20: qty 2
  Filled 2015-05-20: qty 1

## 2015-05-20 MED ORDER — POLYETHYL GLYCOL-PROPYL GLYCOL 0.4-0.3 % OP GEL: Freq: Every day | OPHTHALMIC | Status: DC | PRN

## 2015-05-20 MED ORDER — ALUM & MAG HYDROXIDE-SIMETH 200-200-20 MG/5ML PO SUSP: 30.0000 mL | ORAL | Status: DC | PRN

## 2015-05-20 MED ORDER — VITAMIN D 1000 UNITS PO TABS: 2000.0000 [IU] | ORAL_TABLET | Freq: Every day | ORAL | Status: DC

## 2015-05-20 MED ORDER — CEFAZOLIN SODIUM-DEXTROSE 2-3 GM-% IV SOLR
INTRAVENOUS | Status: AC
  Filled 2015-05-20: qty 50

## 2015-05-20 MED ORDER — SENNOSIDES-DOCUSATE SODIUM 8.6-50 MG PO TABS
1.0000 | ORAL_TABLET | Freq: Two times a day (BID) | ORAL | Status: DC
  Administered 2015-05-21 – 2015-05-22 (×3): 1 via ORAL
  Filled 2015-05-20 (×4): qty 1

## 2015-05-20 MED ORDER — MAGNESIUM HYDROXIDE 400 MG/5ML PO SUSP
30.0000 mL | Freq: Every day | ORAL | Status: DC | PRN
  Administered 2015-05-21: 30 mL via ORAL
  Filled 2015-05-20: qty 30

## 2015-05-20 MED ORDER — RISAQUAD PO CAPS
1.0000 | ORAL_CAPSULE | Freq: Every day | ORAL | Status: DC
  Administered 2015-05-21: 1 via ORAL
  Filled 2015-05-20 (×2): qty 1

## 2015-05-20 MED ORDER — TRANEXAMIC ACID 1000 MG/10ML IV SOLN
1500.0000 mg | INTRAVENOUS | Status: AC
  Administered 2015-05-20: 1500 mg via INTRAVENOUS
  Filled 2015-05-20: qty 15

## 2015-05-20 MED ORDER — MORPHINE SULFATE (PF) 2 MG/ML IV SOLN
2.0000 mg | INTRAVENOUS | Status: DC | PRN
  Administered 2015-05-20 (×2): 2 mg via INTRAVENOUS
  Administered 2015-05-20: 4 mg via INTRAVENOUS
  Filled 2015-05-20 (×2): qty 1
  Filled 2015-05-20: qty 2

## 2015-05-20 MED ORDER — FENTANYL CITRATE (PF) 100 MCG/2ML IJ SOLN
INTRAMUSCULAR | Status: AC
  Administered 2015-05-20: 25 ug via INTRAVENOUS
  Filled 2015-05-20: qty 2

## 2015-05-20 MED ORDER — SODIUM CHLORIDE 0.9 % IV SOLN
INTRAVENOUS | Status: DC
  Administered 2015-05-20 (×2): via INTRAVENOUS

## 2015-05-20 MED ORDER — KETAMINE HCL 50 MG/ML IJ SOLN
INTRAMUSCULAR | Status: DC | PRN
  Administered 2015-05-20: 2 mg via INTRAMUSCULAR
  Administered 2015-05-20: 5 mg via INTRAMUSCULAR
  Administered 2015-05-20: 2 mg via INTRAMUSCULAR

## 2015-05-20 MED ORDER — ACETAMINOPHEN 10 MG/ML IV SOLN
INTRAVENOUS | Status: DC | PRN
  Administered 2015-05-20: 1000 mg via INTRAVENOUS

## 2015-05-20 MED ORDER — NEOMYCIN-POLYMYXIN B GU 40-200000 IR SOLN
Status: AC
  Filled 2015-05-20: qty 20

## 2015-05-20 MED ORDER — PHENOL 1.4 % MT LIQD: 1.0000 | OROMUCOSAL | Status: DC | PRN

## 2015-05-20 SURGICAL SUPPLY — 53 items
BLADE DRUM FLTD (BLADE) ×3 IMPLANT
BLADE SAW 1 (BLADE) ×3 IMPLANT
CANISTER SUCT 1200ML W/VALVE (MISCELLANEOUS) ×3 IMPLANT
CANISTER SUCT 3000ML (MISCELLANEOUS) ×6 IMPLANT
CAPT HIP TOTAL 2 ×3 IMPLANT
CARTRIDGE OIL MAESTRO DRILL (MISCELLANEOUS) ×1 IMPLANT
CATH FOL LEG HOLDER (MISCELLANEOUS) ×3 IMPLANT
CATH TRAY METER 16FR LF (MISCELLANEOUS) ×3 IMPLANT
DIFFUSER MAESTRO (MISCELLANEOUS) ×3 IMPLANT
DRAPE INCISE IOBAN 66X60 STRL (DRAPES) ×3 IMPLANT
DRAPE SHEET LG 3/4 BI-LAMINATE (DRAPES) ×3 IMPLANT
DRAPE TABLE BACK 80X90 (DRAPES) ×3 IMPLANT
DRSG DERMACEA 8X12 NADH (GAUZE/BANDAGES/DRESSINGS) ×3 IMPLANT
DRSG OPSITE POSTOP 3X4 (GAUZE/BANDAGES/DRESSINGS) ×3 IMPLANT
DRSG OPSITE POSTOP 4X12 (GAUZE/BANDAGES/DRESSINGS) ×3 IMPLANT
DRSG OPSITE POSTOP 4X14 (GAUZE/BANDAGES/DRESSINGS) ×3 IMPLANT
DRSG TEGADERM 4X4.75 (GAUZE/BANDAGES/DRESSINGS) ×3 IMPLANT
DURAPREP 26ML APPLICATOR (WOUND CARE) ×3 IMPLANT
ELECT BLADE 6.5 EXT (BLADE) ×3 IMPLANT
ELECT CAUTERY BLADE 6.4 (BLADE) ×3 IMPLANT
GLOVE BIOGEL M STRL SZ7.5 (GLOVE) ×3 IMPLANT
GLOVE INDICATOR 8.0 STRL GRN (GLOVE) ×3 IMPLANT
GLOVE SURG 9.0 ORTHO LTXF (GLOVE) ×3 IMPLANT
GLOVE SURG ORTHO 9.0 STRL STRW (GLOVE) ×3 IMPLANT
GOWN STRL REUS W/ TWL LRG LVL3 (GOWN DISPOSABLE) ×1 IMPLANT
GOWN STRL REUS W/ TWL LRG LVL4 (GOWN DISPOSABLE) ×1 IMPLANT
GOWN STRL REUS W/TWL 2XL LVL3 (GOWN DISPOSABLE) ×3 IMPLANT
GOWN STRL REUS W/TWL LRG LVL3 (GOWN DISPOSABLE) ×2
GOWN STRL REUS W/TWL LRG LVL4 (GOWN DISPOSABLE) ×2
HANDPIECE SUCTION TUBG SURGILV (MISCELLANEOUS) ×3 IMPLANT
HEMOVAC 400CC 10FR (MISCELLANEOUS) ×3 IMPLANT
HOOD PEEL AWAY FLYTE STAYCOOL (MISCELLANEOUS) ×6 IMPLANT
KIT RM TURNOVER STRD PROC AR (KITS) ×3 IMPLANT
NDL SAFETY 18GX1.5 (NEEDLE) ×3 IMPLANT
NS IRRIG 1000ML POUR BTL (IV SOLUTION) ×3 IMPLANT
OIL CARTRIDGE MAESTRO DRILL (MISCELLANEOUS) ×3
PACK HIP PROSTHESIS (MISCELLANEOUS) ×3 IMPLANT
PIN STEIN THRED 5/32 (Pin) ×3 IMPLANT
SOL .9 NS 3000ML IRR  AL (IV SOLUTION) ×2
SOL .9 NS 3000ML IRR UROMATIC (IV SOLUTION) ×1 IMPLANT
SOL PREP PVP 2OZ (MISCELLANEOUS) ×3
SOLUTION PREP PVP 2OZ (MISCELLANEOUS) ×1 IMPLANT
SPONGE DRAIN TRACH 4X4 STRL 2S (GAUZE/BANDAGES/DRESSINGS) ×3 IMPLANT
STAPLER SKIN PROX 35W (STAPLE) ×3 IMPLANT
SUT ETHIBOND #5 BRAIDED 30INL (SUTURE) ×3 IMPLANT
SUT VIC AB 0 CT1 36 (SUTURE) ×3 IMPLANT
SUT VIC AB 1 CT1 36 (SUTURE) ×6 IMPLANT
SUT VIC AB 2-0 CT1 27 (SUTURE) ×2
SUT VIC AB 2-0 CT1 TAPERPNT 27 (SUTURE) ×1 IMPLANT
SYR 20CC LL (SYRINGE) ×3 IMPLANT
TAPE ADH 3 LX (MISCELLANEOUS) ×3 IMPLANT
TAPE TRANSPORE STRL 2 31045 (GAUZE/BANDAGES/DRESSINGS) ×3 IMPLANT
WATER STERILE IRR 1000ML POUR (IV SOLUTION) ×6 IMPLANT

## 2015-05-20 NOTE — Progress Notes (Signed)
Nausea better ice pack to left hip  Heels off bed pillow between legs

## 2015-05-20 NOTE — Evaluation (Signed)
Physical Therapy Evaluation Patient Details Name: Joshua Lang MRN: [id number] DOB: [birth year] Today's Date: 05/20/2015   History of Present Illness  Pt is admitted for L THR on 05/20/15. Pt with history of R THR in 2011.   Clinical Impression  Pt is a pleasant 60 year old male who was admitted for L THR. Pt educated on hip precautions, however still requires cues for maintaining safety and hip precautions. Pt performs bed mobility/transfers with min assist and ambulation with cga and rw. Pt currently limited by pain at this time. RN in room. Pt demonstrates deficits with strength/mobility/pain. Would benefit from skilled PT to address above deficits and promote optimal return to PLOF.      Follow Up Recommendations Home health PT;Supervision for mobility/OOB    Equipment Recommendations       Recommendations for Other Services       Precautions / Restrictions Precautions Precautions: Posterior Hip;Fall Precaution Booklet Issued: No Restrictions Weight Bearing Restrictions: Yes LLE Weight Bearing: Weight bearing as tolerated      Mobility  Bed Mobility Overal bed mobility: Needs Assistance Bed Mobility: Supine to Sit     Supine to sit: Min assist     General bed mobility comments: assist needed for trunk support. Cues also required for faciliation of movement while maintaining hip precautions. Safe technique performed  Transfers Overall transfer level: Needs assistance Equipment used: Rolling walker (2 wheeled) Transfers: Sit to/from Stand Sit to Stand: Min assist         General transfer comment: safe technique performed, however needs cues for sequencing. Bed elevated for assistance  Ambulation/Gait Ambulation/Gait assistance: Min guard Ambulation Distance (Feet): 5 Feet Assistive device: Rolling walker (2 wheeled) Gait Pattern/deviations: Step-to pattern     General Gait Details: ambulated with slow step to gait pattern. Pt complains of increased pain with  movement. Cues given for sequencing.  Stairs            Wheelchair Mobility    Modified Rankin (Stroke Patients Only)       Balance Overall balance assessment: History of Falls;Needs assistance Sitting-balance support: Feet supported Sitting balance-Leahy Scale: Good     Standing balance support: Bilateral upper extremity supported Standing balance-Leahy Scale: Good                               Pertinent Vitals/Pain Pain Assessment: 0-10 Pain Score: 5  Pain Location: L hip Pain Descriptors / Indicators: Operative site guarding Pain Intervention(s): Limited activity within patient's tolerance    Home Living Family/patient expects to be discharged to:: Private residence Living Arrangements: Alone Available Help at Discharge: Family Type of Home: House Home Access: Level entry     Home Layout: Two level Home Equipment: Environmental consultant - 2 wheels;Bedside commode      Prior Function Level of Independence: Independent               Hand Dominance        Extremity/Trunk Assessment   Upper Extremity Assessment: Overall WFL for tasks assessed           Lower Extremity Assessment: LLE deficits/detail (R LE grossly 4/5)   LLE Deficits / Details: grossly 2+/5     Communication   Communication: No difficulties  Cognition Arousal/Alertness: Awake/alert Behavior During Therapy: WFL for tasks assessed/performed Overall Cognitive Status: Within Functional Limits for tasks assessed  General Comments      Exercises Other Exercises Other Exercises: L LE ther-ex performed x 10 reps with min assist including ankle pumps, quad sets, hip abd/add, glut sets, and SAQ. All ther-ex performed x 10 reps with cues for correct technique.      Assessment/Plan    PT Assessment Patient needs continued PT services  PT Diagnosis Difficulty walking;Acute pain   PT Problem List Decreased strength;Decreased activity  tolerance;Decreased mobility;Pain  PT Treatment Interventions Gait training;Therapeutic exercise   PT Goals (Current goals can be found in the Care Plan section) Acute Rehab PT Goals Patient Stated Goal: to go home PT Goal Formulation: With patient Time For Goal Achievement: 06/03/15 Potential to Achieve Goals: Good    Frequency BID   Barriers to discharge        Co-evaluation               End of Session Equipment Utilized During Treatment: Gait belt Activity Tolerance: Patient tolerated treatment well Patient left: in chair;with chair alarm set Nurse Communication: Mobility status         Time: [id number] PT Time Calculation (min) (ACUTE ONLY): 30 min   Charges:   PT Evaluation $PT Eval Moderate Complexity: 1 Procedure PT Treatments $Therapeutic Exercise: 8-22 mins   PT G Codes:        Catlin Doria 05/25/2015, 3:41 PM  Greggory Stallion, PT, DPT [phone]

## 2015-05-20 NOTE — Op Note (Signed)
OPERATIVE NOTE  DATE OF SURGERY:  05/20/2015  PATIENT NAME:  Joshua Lang   DOB: 05/18/1955  MRN: [id number]  PRE-OPERATIVE DIAGNOSIS: Degenerative arthrosis of the left hip, primary  POST-OPERATIVE DIAGNOSIS:  Same  PROCEDURE:  Left total hip arthroplasty  SURGEON:  Marciano Sequin. M.D.  ASSISTANT:  Vance Peper, PA (present and scrubbed throughout the case, critical for assistance with exposure, retraction, instrumentation, and closure)  ANESTHESIA: spinal  ESTIMATED BLOOD LOSS: 200 mL  FLUIDS REPLACED: 1700 mL of crystalloid  DRAINS: 2 medium drains to a Hemovac reservoir  IMPLANTS UTILIZED: DePuy 13.5 mm large stature AML femoral stem, 56 mm OD Pinnacle 100 acetabular component, was for mm neutral Pinnacle Marathon polyethylene insert, and a 36 mm M-SPEC +1.5 mm hip ball  INDICATIONS FOR SURGERY: Joshua Lang is a 60 y.o. year old male with a long history of progressive hip and groin  pain. X-rays demonstrated severe degenerative changes. The patient had not seen any significant improvement despite conservative nonsurgical intervention. After discussion of the risks and benefits of surgical intervention, the patient expressed understanding of the risks benefits and agree with plans for total hip arthroplasty.   The risks, benefits, and alternatives were discussed at length including but not limited to the risks of infection, bleeding, nerve injury, stiffness, blood clots, the need for revision surgery, limb length inequality, dislocation, cardiopulmonary complications, among others, and they were willing to proceed.  PROCEDURE IN DETAIL: The patient was brought into the operating room and, after adequate spinal anesthesia was achieved, the patient was placed in a right lateral decubitus position. Axillary roll was placed and all bony prominences were well-padded. The patient's left hip was cleaned and prepped with alcohol and DuraPrep and draped in the usual sterile fashion. A  "timeout" was performed as per usual protocol. A lateral curvilinear incision was made gently curving towards the posterior superior iliac spine. The IT band was incised in line with the skin incision and the fibers of the gluteus maximus were split in line. The piriformis tendon was identified, skeletonized, and incised at its insertion to the proximal femur and reflected posteriorly. A T type posterior capsulotomy was performed. Prior to dislocation of the femoral head, a threaded Steinmann pin was inserted through a separate stab incision into the pelvis superior to the acetabulum and bent in the form of a stylus so as to assess limb length and hip offset throughout the procedure. The femoral head was then dislocated posteriorly. Inspection of the femoral head demonstrated severe degenerative changes with full-thickness loss of articular cartilage. The femoral neck cut was performed using an oscillating saw. The anterior capsule was elevated off of the femoral neck using a periosteal elevator. Attention was then directed to the acetabulum. The remnant of the labrum was excised using electrocautery. Inspection of the acetabulum also demonstrated significant degenerative changes. The acetabulum was reamed in sequential fashion up to a 55 mm diameter. Good punctate bleeding bone was encountered. A 56 mm Pinnacle 100 acetabular component was positioned and impacted into place. Good scratch fit was appreciated. A +4 mm neutral polyethylene trial was inserted.  Attention was then directed to the proximal femur. A hole for reaming of the proximal femoral canal was created using a high-speed burr. The femoral canal was reamed in sequential fashion up to a 13.5 mm diameter. This allowed for approximately 6 cm of scratch fit. Serial broaches were inserted up to a 13.5 mm large stature femoral broach. Calcar region was planed and  a trial reduction was performed using a 36 mm hip ball with a +1.5 mm neck length. Good  equalization of limb lengths and hip offset was appreciated and excellent stability was noted both anteriorly and posteriorly. Trial components were removed. The acetabular shell was irrigated with copious amounts of normal saline with antibiotic solution and suctioned dry. A +4 mm neutral Pinnacle Marathon polyethylene insert was positioned and impacted into place. Next, a 13.5 mm large stature AML femoral stem was positioned and impacted into place. Excellent scratch fit was appreciated. A trial reduction was again performed with a 36 mm hip ball with a +1.5 mm neck length. Again, good equalization of limb lengths was appreciated and excellent stability appreciated both anteriorly and posteriorly. The hip was then dislocated and the trial hip ball was removed. The Morse taper was cleaned and dried. A 36 mm M-SPEC hip ball with a +1.5 mm neck length was placed on the trunnion and impacted into place. The hip was then reduced and placed through range of motion. Excellent stability was appreciated both anteriorly and posteriorly.  The wound was irrigated with copious amounts of normal saline with antibiotic solution and suctioned dry. Good hemostasis was appreciated. The posterior capsulotomy was repaired using #5 Ethibond. Piriformis tendon was reapproximated to the undersurface of the gluteus medius tendon using #5 Ethibond. Two medium drains were placed in the wound bed and brought out through separate stab incisions to be attached to a Hemovac reservoir. The IT band was reapproximated using interrupted sutures of #1 Vicryl. Subcutaneous tissue was approximated using first #0 Vicryl followed by #2-0 Vicryl. The skin was closed with skin staples.  The patient tolerated the procedure well and was transported to the recovery room in stable condition.   Marciano Sequin., M.D.

## 2015-05-20 NOTE — Anesthesia Procedure Notes (Signed)
Spinal Patient location during procedure: OR Staffing Anesthesiologist: Gunnar Bulla Performed by: anesthesiologist  Preanesthetic Checklist Completed: patient identified, site marked, surgical consent, pre-op evaluation, timeout performed, IV checked and risks and benefits discussed Spinal Block Patient position: sitting Prep: Betadine Patient monitoring: heart rate, cardiac monitor, continuous pulse ox and blood pressure Approach: midline Location: L3-4 Injection technique: single-shot Needle Needle type: Pencil-Tip  Needle gauge: 25 G Needle length: 9 cm Assessment Sensory level: T10 Additional Notes Marcaine 0.5% 2.8 ml intrathecal.

## 2015-05-20 NOTE — Clinical Social Work Note (Signed)
Clinical Social Worker consulted for [employer]. PT is recommending HHPT. CSW will follow up with [facility] regarding discharge plan. CSW is signing off as no further needs identified.   Darden Dates, MSW, LCSW  Clinical Social Worker  [phone]

## 2015-05-20 NOTE — Brief Op Note (Signed)
05/20/2015  10:34 AM  PATIENT:  Joshua Lang  60 y.o. male  PRE-OPERATIVE DIAGNOSIS:  PRIMARY OSTEOARTHRITIS OF LEFT HIP  POST-OPERATIVE DIAGNOSIS:  PRIMARY OSTEOARTHRITIS OF LEFT HIP  PROCEDURE:  Procedure(s): TOTAL HIP ARTHROPLASTY (Left)  SURGEON:  Surgeon(s) and Role:    * Dereck Leep, MD - Primary  ASSISTANTS: Vance Peper, PA   ANESTHESIA:   spinal  EBL:  Total I/O In: 1700 [I.V.:1700] Out: 275 [Urine:75; Blood:200]  BLOOD ADMINISTERED:none  DRAINS: 2 medium Hemovac drains   LOCAL MEDICATIONS USED:  NONE  SPECIMEN:  Source of Specimen:  Left femoral head  DISPOSITION OF SPECIMEN:  PATHOLOGY  COUNTS:  YES  TOURNIQUET:  * No tourniquets in log *  DICTATION: .Dragon Dictation  PLAN OF CARE: Admit to inpatient   PATIENT DISPOSITION:  PACU - hemodynamically stable.   Delay start of Pharmacological VTE agent (>24hrs) due to surgical blood loss or risk of bleeding: yes

## 2015-05-20 NOTE — Progress Notes (Signed)
zofran given for nausea  

## 2015-05-20 NOTE — H&P (Signed)
The patient has been re-examined, and the chart reviewed, and there have been no interval changes to the documented history and physical.    The risks, benefits, and alternatives have been discussed at length. The patient expressed understanding of the risks benefits and agreed with plans for surgical intervention.  Joseeduardo P. Markail Diekman, Jr. M.D.    

## 2015-05-20 NOTE — Anesthesia Preprocedure Evaluation (Addendum)
Anesthesia Evaluation  Patient identified by MRN, date of birth, ID band Patient awake    Reviewed: Allergy & Precautions, NPO status , Patient's Chart, lab work & pertinent test results, reviewed documented beta blocker date and time   Airway Mallampati: II  TM Distance: >3 FB     Dental  (+) Chipped   Pulmonary sleep apnea ,           Cardiovascular      Neuro/Psych    GI/Hepatic GERD  ,  Endo/Other    Renal/GU Renal InsufficiencyRenal disease     Musculoskeletal  (+) Arthritis ,   Abdominal   Peds  Hematology   Anesthesia Other Findings Pt. Denies sleep apnea.  Reproductive/Obstetrics                            Anesthesia Physical Anesthesia Plan  ASA: III  Anesthesia Plan: Spinal   Post-op Pain Management:    Induction:   Airway Management Planned:   Additional Equipment:   Intra-op Plan:   Post-operative Plan:   Informed Consent: I have reviewed the patients History and Physical, chart, labs and discussed the procedure including the risks, benefits and alternatives for the proposed anesthesia with the patient or authorized representative who has indicated his/her understanding and acceptance.     Plan Discussed with: CRNA  Anesthesia Plan Comments:         Anesthesia Quick Evaluation

## 2015-05-20 NOTE — Transfer of Care (Signed)
Immediate Anesthesia Transfer of Care Note  Patient: Joshua Lang  Procedure(s) Performed: Procedure(s): TOTAL HIP ARTHROPLASTY (Left)  Patient Location: PACU  Anesthesia Type:Spinal  Level of Consciousness: awake, alert , oriented and patient cooperative  Airway & Oxygen Therapy: Patient Spontanous Breathing and Patient connected to nasal cannula oxygen  Post-op Assessment: Report given to RN and Post -op Vital signs reviewed and stable  Post vital signs: Reviewed and stable  Last Vitals:  Filed Vitals:   05/20/15 0616 05/20/15 1033  BP: 124/84 117/69  Pulse: 70 76  Temp: 36.9 C 36.9 C  Resp: 16 16    Complications: No apparent anesthesia complications

## 2015-05-21 ENCOUNTER — Encounter: Payer: Self-pay | Admitting: Orthopedic Surgery

## 2015-05-21 LAB — CBC
HCT: 37.8 % — ABNORMAL LOW (ref 40.0–52.0)
HEMOGLOBIN: 12.9 g/dL — AB (ref 13.0–18.0)
MCH: 32.1 pg (ref 26.0–34.0)
MCHC: 34.2 g/dL (ref 32.0–36.0)
MCV: 93.9 fL (ref 80.0–100.0)
Platelets: 143 10*3/uL — ABNORMAL LOW (ref 150–440)
RBC: 4.03 MIL/uL — ABNORMAL LOW (ref 4.40–5.90)
RDW: 13.6 % (ref 11.5–14.5)
WBC: 8.1 10*3/uL (ref 3.8–10.6)

## 2015-05-21 LAB — BASIC METABOLIC PANEL
ANION GAP: 5 (ref 5–15)
BUN: 10 mg/dL (ref 6–20)
CALCIUM: 8.5 mg/dL — AB (ref 8.9–10.3)
CO2: 29 mmol/L (ref 22–32)
CREATININE: 0.79 mg/dL (ref 0.61–1.24)
Chloride: 103 mmol/L (ref 101–111)
GFR calc non Af Amer: >60 mL/min (ref >60)
Glucose, Bld: 135 mg/dL — ABNORMAL HIGH (ref 65–99)
Potassium: 4.1 mmol/L (ref 3.5–5.1)
SODIUM: 137 mmol/L (ref 135–145)

## 2015-05-21 NOTE — Evaluation (Signed)
Occupational Therapy Evaluation Patient Details Name: Joshua Lang MRN: [id number] DOB: [birth year] Today's Date: 05/21/2015    History of Present Illness Pt is admitted for L THR on 05/20/15. Pt with history of R THR in 2011. Patient lives alone but has a sister who has come to stay and help for a while.    Clinical Impression  to Patient was evaluated for OT this date.  Patient was admitted to [facility] for a left THR on 05/20/15, he has a prior history of right THR in 2011.  He lives alone in a 2 story apartment locally.  His bathroom and bedroom are on the second level and he has posterior hip precautions.  He was previously independent with all self care and IADL tasks including driving and is retired.  He presents with decreased functional transfers, decreased mobility, decreased strength and ROM of LLE, increased pain after surgical intervention and decreased ability to perform basic self care tasks and will need to follow posterior hip precautions.  He would benefit from skilled OT to address the above impairments and to increase his independence in self care tasks to be able to return home independently.  Given his home setup and precautions, he will likely benefit from [facility] to help address barriers of having bedroom and bath on second level and to ensure safety.  Patient was seen this date for evaluation and treatment with focus on adaptive equipment training to be able to manage lower body self care following hip precautions.       Follow Up Recommendations  Home health OT    Equipment Recommendations       Recommendations for Other Services       Precautions / Restrictions Precautions Precautions: Posterior Hip;Fall Precaution Booklet Issued: No Restrictions Weight Bearing Restrictions: Yes LLE Weight Bearing: Weight bearing as tolerated      Mobility Bed Mobility                  Transfers Overall transfer level: Needs assistance Equipment used: Rolling walker (2  wheeled)   Sit to Stand: Min assist         General transfer comment: min from recliner, min guard from elevated toilet    Balance                                            ADL Overall ADL's : Needs assistance/impaired Eating/Feeding: Independent   Grooming: Standing;Min guard   Upper Body Bathing: Set up;Modified independent   Lower Body Bathing: Set up;Maximal assistance Lower Body Bathing Details (indicate cue type and reason): increased assistance due to hip precautions. Upper Body Dressing : Set up;Modified independent   Lower Body Dressing: Set up;Minimal assistance;With adaptive equipment   Toilet Transfer: Set up;Min guard;BSC Toilet Transfer Details (indicate cue type and reason): ambulated to bathroom with min guard and able to use BSC over toilet. Toileting- Clothing Manipulation and Hygiene: Set up;Min guard         General ADL Comments: Patient has a Secondary school teacher, shoehorn and dressing stick at home, unsure if he still has a sock aid.  Patient was instructed on sock aid and reacher to manage socks this date and able to demo with setup and minimal assistance.  Verbal instruction on donning and doffing of underwear.  Toileting in bathroom this date.     Vision     Perception  Praxis      Pertinent Vitals/Pain Pain Assessment: 0-10 Pain Score: 4  Pain Location: 4/10 with movement, otherwise 1/10 when seated, left hip  Pain Descriptors / Indicators: Operative site guarding;Aching Pain Intervention(s): Limited activity within patient's tolerance;Monitored during session     Hand Dominance Right   Extremity/Trunk Assessment Upper Extremity Assessment Upper Extremity Assessment: Overall WFL for tasks assessed   Lower Extremity Assessment Lower Extremity Assessment: LLE deficits/detail LLE Deficits / Details: decreased LLE strength after surgical intervention.   Cervical / Trunk Assessment Cervical / Trunk Assessment: Normal    Communication Communication Communication: No difficulties   Cognition Arousal/Alertness: Awake/alert Behavior During Therapy: WFL for tasks assessed/performed Overall Cognitive Status: Within Functional Limits for tasks assessed                     General Comments       Exercises       Shoulder Instructions      Home Living Family/patient expects to be discharged to:: Private residence   Available Help at Discharge: Family Type of Home: Apartment Home Access: Level entry     Home Layout: Two level Alternate Level Stairs-Number of Steps: Bedroom and bathroom he uses is on the second level, unsure of how many steps to access.  He reports he was able to manage the stairs after his last surgery.   Alternate Level Stairs-Rails: Can reach both Bathroom Shower/Tub: Tub/shower unit;Curtain Shower/tub characteristics: Architectural technologist: Handicapped height Bathroom Accessibility: Yes   Home Equipment: Environmental consultant - 2 wheels;Bedside commode          Prior Functioning/Environment Level of Independence: Independent             OT Diagnosis: Other (comment);Generalized weakness (decreased ability to perform self care tasks following surgery and precautions.)   OT Problem List: Decreased strength;Decreased knowledge of precautions;Decreased range of motion;Decreased knowledge of use of DME or AE;Other (comment) (decreased ability to perform lower body self care)   OT Treatment/Interventions: Self-care/ADL training;Therapeutic exercise;Patient/family education;DME and/or AE instruction    OT Goals(Current goals can be found in the care plan section) Acute Rehab OT Goals Patient Stated Goal: patient wants to be able to return home and be independent with care. OT Goal Formulation: With patient Time For Goal Achievement: 06/01/15 Potential to Achieve Goals: Good  OT Frequency: Min 1X/week   Barriers to D/C:    Pt states sister reports icy walkway to apartment  which will be a fall risk, recommended patient call building supervisor and request walkways be cleared by tomorrow afternoon for potential discharge home.  He was able to call and request while therapist was present.       Co-evaluation              End of Session Equipment Utilized During Treatment: Gait belt;Rolling walker  Activity Tolerance: Patient tolerated treatment well;Other (comment) (became nauseous and sweaty towards end of the session, offered cold cloth and ginger ale.  ) Patient left:     Time: [id number] OT Time Calculation (min): 59 min Charges:  OT General Charges $OT Visit: 1 Procedure OT Evaluation $OT Eval Moderate Complexity: 1 Procedure OT Treatments $Self Care/Home Management : 23-37 mins G-Codes:    Aydien Majette  Rhena Glace T Bobbye Reinitz, OTR/L, CLT  05/21/2015, 10:49 AM

## 2015-05-21 NOTE — Plan of Care (Signed)
Problem: Respiratory: Goal: Ability to maintain adequate ventilation will improve Outcome: Progressing Using incentive spirometer and remaining on room air.  Problem: Pain Management: Goal: Pain level will decrease Outcome: Progressing Pain controled with oral pain medication during the night.  Problem: Urinary Elimination: Goal: Will remain free from infection Outcome: Progressing Foley to be removed this morning.

## 2015-05-21 NOTE — Care Management Note (Signed)
Case Management Note  Patient Details  Name: Joshua Lang MRN: [id number] Date of Birth: 1955-10-26  Subjective/Objective:                  Met with patient to discuss discharge planning. He would like to use [facility] for home health PT. He uses [employer] [facility] for Rx [phone]. He has a rolling walker and bedside commode available in the home. He states his sister Joshua Lang will provide assistance to him in the home. He plans to return home at discharge  Action/Plan: List of home health agencies left with patient. Referral called to [facility]. Joshua Lang. Lovenox 70m #14 [facility]. RNCM will continue to follow.   Expected Discharge Date:                  Expected Discharge Plan:     In-House Referral:     Discharge planning Services  CM Consult  Post Acute Care Choice:  Home Health Choice offered to:  Patient  DME Arranged:    DME Agency:     HH Arranged:  PT HH Agency:  [facility] Status of Service:  In process, will continue to follow  Medicare Important Message Given:    Date Medicare IM Given:    Medicare IM give by:    Date Additional Medicare IM Given:    Additional Medicare Important Message give by:     If discussed at [facility] [place] Meetings, dates discussed:    Additional Comments:  AMarshell Garfinkel RN 05/21/2015, 8:54 AM

## 2015-05-21 NOTE — Progress Notes (Signed)
Physical Therapy Treatment Patient Details Name: Joshua Lang MRN: [id number] DOB: [birth year] Today's Date: 05/21/2015    History of Present Illness Pt is admitted for L THR on 05/20/15. Pt with history of R THR in 2011. Patient lives alone but has a sister who has come to stay and help for a while.     PT Comments    Pt is making good progress towards goals with improved gait distance and ability to perform fluid gait sequencing. Pt with moderate pain, however does not increase with movement. Pt able to recall 3/3 hip precautions this date without cues. Cues required for maintaining hip precautions during OOB mobility. Good tolerance with hep this date. Pt continues to be motivated to perform therapy.  Follow Up Recommendations  Home health PT;Supervision for mobility/OOB     Equipment Recommendations       Recommendations for Other Services       Precautions / Restrictions Precautions Precautions: Posterior Hip;Fall Precaution Booklet Issued: No Restrictions Weight Bearing Restrictions: Yes LLE Weight Bearing: Weight bearing as tolerated    Mobility  Bed Mobility Overal bed mobility: Needs Assistance Bed Mobility: Supine to Sit     Supine to sit: Min assist     General bed mobility comments: Improved technique from previous session. Pt requires less cues for sequencing, however does need assist for L LE and scooting out towards EOB.  Transfers Overall transfer level: Needs assistance Equipment used: Rolling walker (2 wheeled) Transfers: Sit to/from Stand Sit to Stand: Min guard         General transfer comment: Safe technique performed with pt able to push from seated surface. Upright posture demonstrated  Ambulation/Gait Ambulation/Gait assistance: Min guard Ambulation Distance (Feet): 250 Feet Assistive device: Rolling walker (2 wheeled) Gait Pattern/deviations: Step-through pattern     General Gait Details: Improved gait speed with step to gait pattern  progressing to reciprocal gait pattern. Pt needs cues for step  length and keeping head up during gait training. Pt reports feeling better during ambulation with nausea symptoms resolving.   Stairs            Wheelchair Mobility    Modified Rankin (Stroke Patients Only)       Balance                                    Cognition Arousal/Alertness: Awake/alert Behavior During Therapy: WFL for tasks assessed/performed Overall Cognitive Status: Within Functional Limits for tasks assessed                      Exercises Other Exercises Other Exercises: L LE ther-ex performed x 12 reps with min assist including ankle pumps, quad sets, glut sets, hip abd/add,  and SAQ. All ther-ex performed x 10 reps with cues for correct technique.    General Comments        Pertinent Vitals/Pain Pain Assessment: 0-10 Pain Score: 5  Pain Location: L hip Pain Descriptors / Indicators: Operative site guarding;Cramping Pain Intervention(s): Limited activity within patient's tolerance;Premedicated before session;Ice applied    Home Living Family/patient expects to be discharged to:: Private residence   Available Help at Discharge: Family Type of Home: Apartment Home Access: Level entry   Home Layout: Two level Home Equipment: Environmental consultant - 2 wheels;Bedside commode      Prior Function Level of Independence: Independent          PT  Goals (current goals can now be found in the care plan section) Acute Rehab PT Goals Patient Stated Goal: patient wants to be able to return home and be independent with care. PT Goal Formulation: With patient Time For Goal Achievement: 06/03/15 Potential to Achieve Goals: Good Progress towards PT goals: Progressing toward goals    Frequency  BID    PT Plan Current plan remains appropriate    Co-evaluation             End of Session Equipment Utilized During Treatment: Gait belt Activity Tolerance: Patient tolerated  treatment well Patient left: in chair;with chair alarm set     Time: [phone] PT Time Calculation (min) (ACUTE ONLY): 23 min  Charges:  $Gait Training: 8-22 mins $Therapeutic Exercise: 8-22 mins                    G Codes:      Stefhanie Kachmar 06/11/15, 11:37 AM  Greggory Stallion, PT, DPT [phone]

## 2015-05-21 NOTE — Anesthesia Postprocedure Evaluation (Signed)
Anesthesia Post Note  Patient: Joshua Lang  Procedure(s) Performed: Procedure(s) (LRB): TOTAL HIP ARTHROPLASTY (Left)  Patient location during evaluation: Nursing Unit Anesthesia Type: Spinal Level of consciousness: awake and alert and oriented Pain management: satisfactory to patient Vital Signs Assessment: post-procedure vital signs reviewed and stable Respiratory status: spontaneous breathing and nonlabored ventilation Cardiovascular status: blood pressure returned to baseline Postop Assessment: no headache, no backache and patient able to bend at knees Anesthetic complications: no    Last Vitals:  Filed Vitals:   05/21/15 0457 05/21/15 0753  BP: 108/69 115/78  Pulse: 86 77  Temp: 36.5 C 36.8 C  Resp: 18     Last Pain:  Filed Vitals:   05/21/15 0920  PainSc: 2                  Nadene Rubins

## 2015-05-21 NOTE — Progress Notes (Signed)
Physical Therapy Treatment Patient Details Name: Joshua Lang MRN: [id number] DOB: [birth year] Today's Date: 05/21/2015    History of Present Illness Pt is admitted for L THR on 05/20/15. Pt with history of R THR in 2011. Patient lives alone but has a sister who has come to stay and help for a while.     PT Comments    Pt is making good progress towards goals with increased fluid gait pattern this session. Pt with good endurance with there-ex and improved functional independence. Needs to work on stair training prior to dc.  Follow Up Recommendations  Home health PT;Supervision for mobility/OOB     Equipment Recommendations       Recommendations for Other Services       Precautions / Restrictions Precautions Precautions: Posterior Hip;Fall Precaution Booklet Issued: Yes (comment) Restrictions Weight Bearing Restrictions: Yes LLE Weight Bearing: Weight bearing as tolerated    Mobility  Bed Mobility               General bed mobility comments: Pt received in recliner, bed mobility  Transfers Overall transfer level: Needs assistance Equipment used: Rolling walker (2 wheeled) Transfers: Sit to/from Stand Sit to Stand: Min guard         General transfer comment: Safe technique performed with pt able to push from seated surface. Upright posture demonstrated  Ambulation/Gait Ambulation/Gait assistance: Min guard Ambulation Distance (Feet): 250 Feet Assistive device: Rolling walker (2 wheeled) Gait Pattern/deviations: Step-through pattern     General Gait Details: Pt is demonstrating good reciprocal gait pattern with improved speed. Pt able to keep rw moving this session and demonstrates upright posture without cues. Still requires slight cues for hip precautions during L turns.   Stairs            Wheelchair Mobility    Modified Rankin (Stroke Patients Only)       Balance                                    Cognition  Arousal/Alertness: Awake/alert Behavior During Therapy: WFL for tasks assessed/performed Overall Cognitive Status: Within Functional Limits for tasks assessed                      Exercises Other Exercises Other Exercises: L LE ther-ex performed x 12 reps with min assist including ankle pumps, quad sets, glut sets, hip abd/add,  and LAQ. All ther-ex performed x 12 reps with cues for correct technique.    General Comments        Pertinent Vitals/Pain Pain Assessment: 0-10 Pain Score: 5  Pain Location: L hip Pain Descriptors / Indicators: Operative site guarding Pain Intervention(s): Limited activity within patient's tolerance;Ice applied    Home Living                      Prior Function            PT Goals (current goals can now be found in the care plan section) Acute Rehab PT Goals Patient Stated Goal: patient wants to be able to return home and be independent with care. PT Goal Formulation: With patient Time For Goal Achievement: 06/03/15 Potential to Achieve Goals: Good Progress towards PT goals: Progressing toward goals    Frequency  BID    PT Plan Current plan remains appropriate    Co-evaluation  End of Session Equipment Utilized During Treatment: Gait belt Activity Tolerance: Patient tolerated treatment well Patient left: in chair;with chair alarm set     Time: [phone] PT Time Calculation (min) (ACUTE ONLY): 24 min  Charges:  $Gait Training: 8-22 mins $Therapeutic Exercise: 8-22 mins                    G Codes:      Kyndel Egger 05/26/15, 5:07 PM Greggory Stallion, PT, DPT [phone]

## 2015-05-21 NOTE — Progress Notes (Signed)
   Subjective: 1 Day Post-Op Procedure(s) (LRB): TOTAL HIP ARTHROPLASTY (Left) Patient reports pain as mild.   Patient is well, and has had no acute complaints or problems We will start therapy today.  Plan is to go Home after hospital stay. no nausea and no vomiting Patient denies any chest pains or shortness of breath. Objective: Vital signs in last 24 hours: Temp:  [97.3 F (36.3 C)-98.5 F (36.9 C)] 97.7 F (36.5 C) (01/10 0457) Pulse Rate:  [65-88] 86 (01/10 0457) Resp:  [8-22] 18 (01/10 0457) BP: (108-138)/(61-78) 108/69 mmHg (01/10 0457) SpO2:  [95 %-100 %] 96 % (01/10 0457) Weight:  [108.092 kg (238 lb 4.8 oz)] 108.092 kg (238 lb 4.8 oz) (01/09 1201) well approximated incision Heels are non tender and elevated off the bed using rolled towels Intake/Output from previous day: 01/09 0701 - 01/10 0700 In: 3798.3 [P.O.:240; I.V.:3008.3; IV Piggyback:550] Out: [id number] [Urine:3825; Drains:390; Blood:200] Intake/Output this shift:     Recent Labs  05/21/15 0503  HGB 12.9*    Recent Labs  05/21/15 0503  WBC 8.1  RBC 4.03*  HCT 37.8*  PLT 143*    Recent Labs  05/21/15 0503  NA 137  K 4.1  CL 103  CO2 29  BUN 10  CREATININE 0.79  GLUCOSE 135*  CALCIUM 8.5*   No results for input(s): LABPT, INR in the last 72 hours.  EXAM General - Patient is Alert, Appropriate and Oriented Extremity - Neurologically intact Neurovascular intact Sensation intact distally Intact pulses distally Dorsiflexion/Plantar flexion intact Dressing - dressing C/D/I Motor Function - intact, moving foot and toes well on exam.    Past Medical History  Diagnosis Date  . Colonic polyp   . Nephrolithiasis   . Hypercholesteremia   . 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 ED (erectile dysfunction)   . Allergic rhinitis   . Enlarged prostate     Moderately  . GERD (gastroesophageal reflux disease)     Assessment/Plan: 1 Day Post-Op Procedure(s) (LRB): TOTAL HIP ARTHROPLASTY  (Left) Active Problems:   Degenerative joint disease (DJD) of hip   S/P total hip arthroplasty  Estimated body mass index is 31.45 kg/(m^2) as calculated from the following:   Height as of this encounter: 6\' 1"  (1.854 m).   Weight as of this encounter: 108.092 kg (238 lb 4.8 oz). Advance diet Up with therapy D/C IV fluids Plan for discharge tomorrow Discharge home with home health  Labs: reviewed DVT Prophylaxis - Lovenox, Foot Pumps and TED hose Weight-Bearing as tolerated to left leg D/C O2 and Pulse OX and try on Room Air Begin working on a bowel movement Labs in am  [employer]. [place] 05/21/2015, 7:31 AM

## 2015-05-22 LAB — BASIC METABOLIC PANEL
ANION GAP: 4 — AB (ref 5–15)
BUN: 12 mg/dL (ref 6–20)
CALCIUM: 8.4 mg/dL — AB (ref 8.9–10.3)
CHLORIDE: 105 mmol/L (ref 101–111)
CO2: 29 mmol/L (ref 22–32)
CREATININE: 0.72 mg/dL (ref 0.61–1.24)
GFR calc Af Amer: >60 mL/min (ref >60)
GFR calc non Af Amer: >60 mL/min (ref >60)
GLUCOSE: 105 mg/dL — AB (ref 65–99)
Potassium: 3.9 mmol/L (ref 3.5–5.1)
Sodium: 138 mmol/L (ref 135–145)

## 2015-05-22 LAB — SURGICAL PATHOLOGY

## 2015-05-22 LAB — CBC
HEMATOCRIT: 35.9 % — AB (ref 40.0–52.0)
HEMOGLOBIN: 12.3 g/dL — AB (ref 13.0–18.0)
MCH: 32.7 pg (ref 26.0–34.0)
MCHC: 34.2 g/dL (ref 32.0–36.0)
MCV: 95.6 fL (ref 80.0–100.0)
Platelets: 129 10*3/uL — ABNORMAL LOW (ref 150–440)
RBC: 3.75 MIL/uL — ABNORMAL LOW (ref 4.40–5.90)
RDW: 13.8 % (ref 11.5–14.5)
WBC: 8.6 10*3/uL (ref 3.8–10.6)

## 2015-05-22 MED ORDER — TRAMADOL HCL 50 MG PO TABS: 50.0000 mg | ORAL_TABLET | ORAL | Status: DC | PRN

## 2015-05-22 MED ORDER — ENOXAPARIN SODIUM 40 MG/0.4ML ~~LOC~~ SOLN: 40.0000 mg | SUBCUTANEOUS | Status: DC

## 2015-05-22 MED ORDER — LACTULOSE 10 GM/15ML PO SOLN: 10.0000 g | Freq: Two times a day (BID) | ORAL | Status: DC | PRN

## 2015-05-22 NOTE — Discharge Instructions (Signed)

## 2015-05-22 NOTE — Care Management (Signed)
[facility] is currently closed but I will call them around 0900 for price on Lovenox. I have notified [facility] (Tim/Jerry) of plan for discharge to home today. RNCM will continue to follow.

## 2015-05-22 NOTE — Care Management (Signed)
The health insurance on file is last years card. I have notified patient that the card we have on file is also last years card according to [employer] which has copy. Patient advised to take card to [facility]. No further RNCM needs. Case closed.

## 2015-05-22 NOTE — Progress Notes (Addendum)
Physical Therapy Treatment Patient Details Name: Joshua Lang MRN: [id number] DOB: 01-13-1956 Today's Date: 05/22/2015    History of Present Illness Pt is admitted for L THR on 05/20/15. Pt with history of R THR in 2011. Patient lives alone but has a sister who has come to stay and help for a while.     PT Comments    Pt is making good progress towards goals and has completed stair training with safe technique. Pt is very motivated to go home this date. Good endurance with there-ex. Pt experiencing increased pain this session, however decreases with mobility. Pt demonstrates improved gait speed this session. Pt able to recall 3/3 hip precautions. Pt is safe to dc home this date.  Follow Up Recommendations  Home health PT     Equipment Recommendations       Recommendations for Other Services       Precautions / Restrictions Precautions Precautions: Posterior Hip;Fall Precaution Booklet Issued: Yes (comment) Restrictions Weight Bearing Restrictions: Yes LLE Weight Bearing: Weight bearing as tolerated    Mobility  Bed Mobility               General bed mobility comments: Pt received in recliner, bed mobility not performed  Transfers Overall transfer level: Needs assistance Equipment used: Rolling walker (2 wheeled) Transfers: Sit to/from Stand Sit to Stand: Supervision         General transfer comment: Safe technique performed with pt able to push from seated surface. Upright posture demonstrated  Ambulation/Gait Ambulation/Gait assistance: Supervision Ambulation Distance (Feet): 350 Feet Assistive device: Rolling walker (2 wheeled) Gait Pattern/deviations: Step-through pattern     General Gait Details: Reciprocal gait pattern performed with cues for upright posture. Slight antalgic gait noted, secondary to increased pain this date. Pt reports pain improved with increased distance. No fatigue noted. Pt able to complete 10' walk test in 5  seconds   Stairs Stairs: Yes Stairs assistance: Min guard Stair Management: Two rails;Step to pattern   General stair comments: Pt ambulated up/down 4 steps x 3 reps to simulate going upstairs at his home. Pt demonstrates safe technique and reports feeling confident about stair training.  Wheelchair Mobility    Modified Rankin (Stroke Patients Only)       Balance                                    Cognition Arousal/Alertness: Awake/alert Behavior During Therapy: WFL for tasks assessed/performed Overall Cognitive Status: Within Functional Limits for tasks assessed                      Exercises Other Exercises Other Exercises: L LE ther-ex performed x 15 reps including L ankle pumps, quad sets, glut sets, hip abd/add, SAQ and LAQ. All ther-ex performed with cga with min assist for hip abd/add    General Comments        Pertinent Vitals/Pain Pain Assessment: 0-10 Pain Score: 6  Pain Location: L hip Pain Descriptors / Indicators: Tightness Pain Intervention(s): Limited activity within patient's tolerance;Premedicated before session    Home Living                      Prior Function            PT Goals (current goals can now be found in the care plan section) Acute Rehab PT Goals Patient Stated Goal: patient wants  to be able to return home and be independent with care. PT Goal Formulation: With patient Time For Goal Achievement: 06/03/15 Potential to Achieve Goals: Good Progress towards PT goals: Progressing toward goals    Frequency  BID    PT Plan Current plan remains appropriate    Co-evaluation             End of Session Equipment Utilized During Treatment: Gait belt Activity Tolerance: Patient tolerated treatment well Patient left: in chair;with chair alarm set     Time: 0827-0850 PT Time Calculation (min) (ACUTE ONLY): 23 min  Charges:  $Gait Training: 8-22 mins $Therapeutic Exercise: 8-22 mins                     G Codes:      Vasilia Dise June 14, 2015, 9:12 AM  Greggory Stallion, PT, DPT [phone]

## 2015-05-22 NOTE — Discharge Summary (Signed)
Physician Discharge Summary  Patient ID: Joshua Lang MRN: [id number] DOB/AGE: 1955-08-10 60 y.o.  Admit date: 05/20/2015 Discharge date: 05/22/2015  Admission Diagnoses:  PRIMARY OSTEOARTHRITIS OF LEFT HIP   Discharge Diagnoses: Patient Active Problem List   Diagnosis Date Noted  . Degenerative joint disease (DJD) of hip 05/20/2015  . S/P total hip arthroplasty 05/20/2015  . FATIGUE 03/12/2010  . ALLERGIC RHINITIS 02/17/2009  . 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 UNEQUAL LEG LENGTH 02/13/2009  . SLEEP APNEA 02/13/2009  . COLONIC POLYPS 10/30/2008  . HYPERCHOLESTEROLEMIA 10/30/2008  . NEPHROLITHIASIS 10/30/2008  . DEGENERATIVE JOINT DISEASE, HIPS 10/30/2008    Past Medical History  Diagnosis Date  . Colonic polyp   . Nephrolithiasis   . Hypercholesteremia   . 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 ED (erectile dysfunction)   . Allergic rhinitis   . Enlarged prostate     Moderately  . GERD (gastroesophageal reflux disease)      Transfusion: No transfusion during this admission.   Consultants (if any):  Case management for home health assistance   Discharged Condition: Improved  Hospital Course: Joshua Lang is an 60 y.o. male who was admitted 05/20/2015 with a diagnosis of degenerative arthrosis to left hip  and went to the operating room on 05/20/2015 and underwent the above named procedures.    Surgeries:Procedure(s): TOTAL HIP ARTHROPLASTY on 05/20/2015  PRE-OPERATIVE DIAGNOSIS: Degenerative arthrosis of the left hip, primary  POST-OPERATIVE DIAGNOSIS: Same  PROCEDURE: Left total hip arthroplasty  SURGEON: Marciano Sequin. M.D.  ASSISTANT: Vance Peper, PA (present and scrubbed throughout the case, critical for assistance with exposure, retraction, instrumentation, and closure)  ANESTHESIA: spinal  ESTIMATED BLOOD LOSS: 200 mL  FLUIDS REPLACED: 1700 mL of crystalloid  DRAINS: 2 medium drains to a Hemovac reservoir  IMPLANTS UTILIZED: DePuy 13.5  mm large stature AML femoral stem, 56 mm OD Pinnacle 100 acetabular component, was for mm neutral Pinnacle Marathon polyethylene insert, and a 36 mm M-SPEC +1.5 mm hip ball  INDICATIONS FOR SURGERY: Joshua Lang is a 60 y.o. year old male with a long history of progressive hip and groin pain. X-rays demonstrated severe degenerative changes. The patient had not seen any significant improvement despite conservative nonsurgical intervention. After discussion of the risks and benefits of surgical intervention, the patient expressed understanding of the risks benefits and agree with plans for total hip arthroplasty.   The risks, benefits, and alternatives were discussed at length including but not limited to the risks of infection, bleeding, nerve injury, stiffness, blood clots, the need for revision surgery, limb length inequality, dislocation, cardiopulmonary complications, among others, and they were willing to proceed. Patient tolerated the surgery well. No complications .Patient was taken to PACU where she was stabilized and then transferred to the orthopedic floor.  Patient started on Lovenox 30 q 12 hrs. Foot pumps applied bilaterally at 80 mm hg. Heels elevated off bed with rolled towels. No evidence of DVT. Calves non tender. Negative Homan. Physical therapy started on day #1 for gait training and transfer with OT starting on  day #1 for ADL and assisted devices. Patient has done well with therapy. Ambulated greater than 250 feet upon being discharged. Able to go up 4 steps safely and independently without a problem  Patient's IV and foley were d/c on day # 1  and hemovac was d/c on day #2.   He was given perioperative antibiotics:  Anti-infectives    Start  Dose/Rate Route Frequency Ordered Stop   05/20/15 1400  ceFAZolin (ANCEF) IVPB 2 g/50 mL premix     2 g 100 mL/hr over 30 Minutes Intravenous Every 6 hours 05/20/15 1150 05/21/15 0859   05/20/15 0559  ceFAZolin (ANCEF) 2-3 GM-% IVPB  SOLR    Comments:  Machia, Millissa: cabinet override      05/20/15 0559 05/20/15 1759   05/20/15 0115  ceFAZolin (ANCEF) IVPB 2 g/50 mL premix     2 g 100 mL/hr over 30 Minutes Intravenous  Once 05/20/15 0108 05/20/15 0754    .  He was fitted with AV1 compression foot pump devices bilaterally with early ambulation, instructed on heel pump and TED stocking  for DVT prophylaxis.  He benefited maximally from the hospital stay and there were no complications.    Recent vital signs:  Filed Vitals:   05/21/15 1944 05/22/15 0420  BP: 124/65 104/60  Pulse: 79 83  Temp: 97.2 F (36.2 C) 98.8 F (37.1 C)  Resp: 17 18    Recent laboratory studies:  Lab Results  Component Value Date   HGB 12.3* 05/22/2015   HGB 12.9* 05/21/2015   HGB 15.5 04/29/2015   Lab Results  Component Value Date   WBC 8.6 05/22/2015   PLT 129* 05/22/2015   Lab Results  Component Value Date   INR 0.99 04/29/2015   Lab Results  Component Value Date   NA 138 05/22/2015   K 3.9 05/22/2015   CL 105 05/22/2015   CO2 29 05/22/2015   BUN 12 05/22/2015   CREATININE 0.72 05/22/2015   GLUCOSE 105* 05/22/2015    Discharge Medications:     Medication List    STOP taking these medications        aspirin 81 MG tablet     naproxen sodium 220 MG tablet  Commonly known as:  ANAPROX      TAKE these medications        aspirin-acetaminophen-caffeine 250-250-65 MG tablet  Commonly known as:  EXCEDRIN MIGRAINE  Take 2 tablets by mouth every 6 (six) hours as needed for headache.     Calcium Carbonate-Vitamin D 600-400 MG-UNIT tablet  Take 1 tablet by mouth 2 (two) times daily.     cholecalciferol 1000 units tablet  Commonly known as:  VITAMIN D  Take 2,000 Units by mouth daily.     enoxaparin 40 MG/0.4ML injection  Commonly known as:  LOVENOX  Inject 0.4 mLs (40 mg total) into the skin daily.     famotidine 20 MG tablet  Commonly known as:  PEPCID  Take 20 mg by mouth daily as needed for heartburn  or indigestion.     fish oil-omega-3 fatty acids 1000 MG capsule  Take 2 g by mouth daily.     fluocinonide cream 0.05 %  Commonly known as:  LIDEX  Apply bid as directed     fluticasone 50 MCG/ACT nasal spray  Commonly known as:  FLONASE  instill 2 sprays into each nostril once daily     ketotifen 0.025 % ophthalmic solution  Commonly known as:  ZADITOR  Place 2 drops into both eyes daily as needed.     MEDERMA EX  Apply 1 application topically daily as needed.     Melatonin 1 MG Tabs  Take 1 tablet by mouth at bedtime.     multivitamin tablet  Take 1 tablet by mouth daily.     polycarbophil 625 MG tablet  Commonly known as:  FIBERCON  Take 1,875 mg by mouth 2 (two) times daily.     PROBIOTIC DAILY PO  Take 1 capsule by mouth at bedtime.     Red Wine Extract Plus Caps  Take 1 capsule by mouth 2 (two) times daily.     Saw Palmetto 450 MG Caps  Take 1 capsule by mouth daily.     sildenafil 20 MG tablet  Commonly known as:  REVATIO  Take 2 to 5 tablets by mouth 30 minutes prior to intercourse.     simvastatin 40 MG tablet  Commonly known as:  ZOCOR  Take 1 tablet (40 mg total) by mouth at bedtime.     SUPER B COMPLEX Tabs  Take 1 tablet by mouth 2 (two) times daily.     SYSTANE OP  Apply 2 drops to eye daily as needed.     traMADol 50 MG tablet  Commonly known as:  ULTRAM  Take 1-2 tablets (50-100 mg total) by mouth every 4 (four) hours as needed for moderate pain.     vardenafil 20 MG tablet  Commonly known as:  LEVITRA  Take 1 tablet (20 mg total) by mouth daily as needed for erectile dysfunction.     vitamin C with rose hips 1000 MG tablet  Take 1,000 mg by mouth 2 (two) times daily.        Diagnostic Studies: Dg Hip Port Unilat With Pelvis 1v Left  05/20/2015  CLINICAL DATA:  Hip replacement surgery, left EXAM: DG HIP (WITH OR WITHOUT PELVIS) 1V PORT LEFT COMPARISON:  CT 07/05/2013 FINDINGS: Femoral and acetabular components of left hip  arthroplasty project in expected location. Lateral skin staples are noted. There is a surgical drain in the soft tissues laterally. No fracture or dislocation. Right hip arthroplasty hardware is noted on the AP projection. Laparoscopic left inguinal hernia mesh sutures . IMPRESSION: 1. Interval left hip arthroplasty without fracture or other apparent complication. Electronically Signed   By: Lucrezia Europe M.D.   On: 05/20/2015 11:37    Disposition:       Discharge Instructions    Diet - low sodium heart healthy    Complete by:  As directed      Increase activity slowly    Complete by:  As directed            Follow-up Information    Follow up with Dereck Leep, MD On 07/02/2015.   Specialty:  Orthopedic Surgery   Why:  at 1:30pm   Contact information:   [place] [address] [phone]        Signed: Watt Climes 05/22/2015, 6:57 AM

## 2015-05-22 NOTE — Progress Notes (Signed)
   Subjective: 2 Days Post-Op Procedure(s) (LRB): TOTAL HIP ARTHROPLASTY (Left) Patient reports pain as 1 on 0-10 scale.   Patient is well, and has had no acute complaints or problems Continue with physical  therapy today.  Plan is to go Home after hospital stay. no nausea and no vomiting Patient denies any chest pains or shortness of breath. Objective: Vital signs in last 24 hours: Temp:  [97.2 F (36.2 C)-98.8 F (37.1 C)] 98.8 F (37.1 C) (01/11 0420) Pulse Rate:  [73-83] 83 (01/11 0420) Resp:  [17-18] 18 (01/11 0420) BP: (104-124)/(60-78) 104/60 mmHg (01/11 0420) SpO2:  [94 %-98 %] 94 % (01/11 0420) well approximated incision Heels are non tender and elevated off the bed using rolled towels Intake/Output from previous day: 01/10 0701 - 01/11 0700 In: 3209 [P.O.:840; I.V.:2369] Out: 222 [Drains:222] Intake/Output this shift: Total I/O In: 480 [P.O.:480] Out: 82 [Drains:82]   Recent Labs  05/21/15 0503 05/22/15 0250  HGB 12.9* 12.3*    Recent Labs  05/21/15 0503 05/22/15 0250  WBC 8.1 8.6  RBC 4.03* 3.75*  HCT 37.8* 35.9*  PLT 143* 129*    Recent Labs  05/21/15 0503 05/22/15 0250  NA 137 138  K 4.1 3.9  CL 103 105  CO2 29 29  BUN 10 12  CREATININE 0.79 0.72  GLUCOSE 135* 105*  CALCIUM 8.5* 8.4*   No results for input(s): LABPT, INR in the last 72 hours.  EXAM General - Patient is Alert, Appropriate and Oriented Extremity - Neurologically intact Neurovascular intact Sensation intact distally Intact pulses distally Dorsiflexion/Plantar flexion intact Dressing - dressing C/D/I Motor Function - intact, moving foot and toes well on exam.    Past Medical History  Diagnosis Date  . Colonic polyp   . Nephrolithiasis   . Hypercholesteremia   . 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 ED (erectile dysfunction)   . Allergic rhinitis   . Enlarged prostate     Moderately  . GERD (gastroesophageal reflux disease)     Assessment/Plan: 2 Days  Post-Op Procedure(s) (LRB): TOTAL HIP ARTHROPLASTY (Left) Active Problems:   Degenerative joint disease (DJD) of hip   S/P total hip arthroplasty  Estimated body mass index is 31.45 kg/(m^2) as calculated from the following:   Height as of this encounter: 6\' 1"  (1.854 m).   Weight as of this encounter: 108.092 kg (238 lb 4.8 oz). Up with therapy Discharge home with home health after pt has a bowel movement and does therapy  Labs: reviewed DVT Prophylaxis - Lovenox, Foot Pumps and TED hose Weight-Bearing as tolerated to left leg Needs a bowel movement today Please change dressing prior to d/c  [facility] R[facility] 05/22/2015, 6:48 AM

## 2015-07-10 ENCOUNTER — Encounter: Payer: Self-pay | Admitting: Family Medicine

## 2015-07-10 ENCOUNTER — Ambulatory Visit (INDEPENDENT_AMBULATORY_CARE_PROVIDER_SITE_OTHER): Payer: BC Managed Care – PPO | Admitting: Family Medicine

## 2015-07-10 VITALS — BP 100/72 | HR 68 | Temp 98.3°F | Ht 73.0 in | Wt 213.2 lb

## 2015-07-10 DIAGNOSIS — E785 Hyperlipidemia, unspecified: Secondary | ICD-10-CM | POA: Diagnosis not present

## 2015-07-10 DIAGNOSIS — Z0000 Encounter for general adult medical examination without abnormal findings: Secondary | ICD-10-CM | POA: Diagnosis not present

## 2015-07-10 DIAGNOSIS — Z79899 Other long term (current) drug therapy: Secondary | ICD-10-CM

## 2015-07-10 DIAGNOSIS — Z125 Encounter for screening for malignant neoplasm of prostate: Secondary | ICD-10-CM

## 2015-07-10 DIAGNOSIS — Z7721 Contact with and (suspected) exposure to potentially hazardous body fluids: Secondary | ICD-10-CM

## 2015-07-10 LAB — HEPATIC FUNCTION PANEL
ALK PHOS: 78 U/L (ref 39–117)
ALT: 19 U/L (ref 0–53)
AST: 20 U/L (ref 0–37)
Albumin: 4.7 g/dL (ref 3.5–5.2)
Bilirubin, Direct: 0.1 mg/dL (ref 0.0–0.3)
TOTAL PROTEIN: 7.5 g/dL (ref 6.0–8.3)
Total Bilirubin: 0.6 mg/dL (ref 0.2–1.2)

## 2015-07-10 LAB — CBC WITH DIFFERENTIAL/PLATELET
BASOS ABS: 0 10*3/uL (ref 0.0–0.1)
Basophils Relative: 0.4 % (ref 0.0–3.0)
Eosinophils Absolute: 0.1 10*3/uL (ref 0.0–0.7)
Eosinophils Relative: 1.6 % (ref 0.0–5.0)
HEMATOCRIT: 44.6 % (ref 39.0–52.0)
Hemoglobin: 14.9 g/dL (ref 13.0–17.0)
LYMPHS PCT: 30.8 % (ref 12.0–46.0)
Lymphs Abs: 1.4 10*3/uL (ref 0.7–4.0)
MCHC: 33.5 g/dL (ref 30.0–36.0)
MCV: 94.1 fl (ref 78.0–100.0)
MONOS PCT: 10.3 % (ref 3.0–12.0)
Monocytes Absolute: 0.5 10*3/uL (ref 0.1–1.0)
NEUTROS PCT: 56.9 % (ref 43.0–77.0)
Neutro Abs: 2.6 10*3/uL (ref 1.4–7.7)
Platelets: 219 10*3/uL (ref 150.0–400.0)
RBC: 4.74 Mil/uL (ref 4.22–5.81)
RDW: 14.1 % (ref 11.5–15.5)
WBC: 4.6 10*3/uL (ref 4.0–10.5)

## 2015-07-10 LAB — BASIC METABOLIC PANEL
BUN: 14 mg/dL (ref 6–23)
CHLORIDE: 103 meq/L (ref 96–112)
CO2: 29 mEq/L (ref 19–32)
Calcium: 10 mg/dL (ref 8.4–10.5)
Creatinine, Ser: 0.83 mg/dL (ref 0.40–1.50)
GFR: 100.88 mL/min (ref >60.00)
Glucose, Bld: 85 mg/dL (ref 70–99)
POTASSIUM: 4.7 meq/L (ref 3.5–5.1)
SODIUM: 139 meq/L (ref 135–145)

## 2015-07-10 LAB — LIPID PANEL
CHOL/HDL RATIO: 3
Cholesterol: 172 mg/dL (ref 0–200)
HDL: 53.9 mg/dL (ref >39.00)
LDL Cholesterol: 104 mg/dL — ABNORMAL HIGH (ref 0–99)
NONHDL: 117.61
TRIGLYCERIDES: 69 mg/dL (ref 0.0–149.0)
VLDL: 13.8 mg/dL (ref 0.0–40.0)

## 2015-07-10 LAB — HIV ANTIBODY (ROUTINE TESTING W REFLEX): HIV 1&2 Ab, 4th Generation: NONREACTIVE

## 2015-07-10 LAB — HEPATITIS C ANTIBODY: HCV AB: NEGATIVE

## 2015-07-10 MED ORDER — SILDENAFIL CITRATE 20 MG PO TABS: ORAL_TABLET | ORAL | Status: DC

## 2015-07-10 MED ORDER — FLUTICASONE PROPIONATE 50 MCG/ACT NA SUSP: NASAL | Status: DC

## 2015-07-10 MED ORDER — SIMVASTATIN 40 MG PO TABS: 40.0000 mg | ORAL_TABLET | Freq: Every day | ORAL | Status: DC

## 2015-07-10 MED ORDER — VARDENAFIL HCL 20 MG PO TABS: 20.0000 mg | ORAL_TABLET | Freq: Every day | ORAL | Status: DC | PRN

## 2015-07-10 MED ORDER — FLUOCINONIDE 0.05 % EX CREA: TOPICAL_CREAM | CUTANEOUS | Status: DC

## 2015-07-10 NOTE — Addendum Note (Signed)
Addended by: Ellamae Sia on: 07/10/2015 02:52 PM   Modules accepted: Orders

## 2015-07-10 NOTE — Progress Notes (Signed)
Pre visit review using our clinic review tool, if applicable. No additional management support is needed unless otherwise documented below in the visit note. 

## 2015-07-10 NOTE — Progress Notes (Signed)
Dr. Frederico Hamman T. Alvia Jablonski, MD, [facility] [place], [address] Phone: [phone] Fax: [phone]  07/10/2015  Patient: Joshua Lang, MRN: [id number], DOB: 09/29/1955, 60 y.o.  Primary Physician:  Owens Loffler, MD   Chief Complaint  Patient presents with  . Annual Exam   Subjective:   Joshua Lang is a 60 y.o. pleasant patient who presents with the following:  Preventative Health Maintenance Visit:  Health Maintenance Summary Reviewed and updated, unless pt declines services.  Tobacco History Reviewed. Alcohol: No concerns, no excessive use Exercise Habits: WALKING 2 MILES A DAY, rec at least 30 mins 5 times a week STD concerns: GF LAST YEAR, NOT NOW Drug Use: None Encouraged self-testicular check  Health Maintenance  Topic Date Due  . Hepatitis C Screening  November 21, 1955  . HIV Screening  10/08/1971  . INFLUENZA VACCINE  12/10/2015  . COLONOSCOPY  12/09/2017  . TETANUS/TDAP  02/14/2019   Immunization History  Administered Date(s) Administered  . Influenza Whole 02/08/2009, 02/07/2012  . Influenza, Seasonal, Injecte, Preservative Fre 01/08/2014  . Influenza,inj,Quad PF,36+ Mos 01/09/2015  . Pneumococcal Conjugate-13 04/23/2015  . Td 02/13/2009  . Zoster 06/22/2013   Patient Active Problem List   Diagnosis Date Noted  . Degenerative joint disease (DJD) of hip 05/20/2015  . S/P total hip arthroplasty 05/20/2015  . FATIGUE 03/12/2010  . ALLERGIC RHINITIS 02/17/2009  . 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 UNEQUAL LEG LENGTH 02/13/2009  . SLEEP APNEA 02/13/2009  . COLONIC POLYPS 10/30/2008  . HYPERCHOLESTEROLEMIA 10/30/2008  . NEPHROLITHIASIS 10/30/2008  . DEGENERATIVE JOINT DISEASE, HIPS 10/30/2008   Past Medical History  Diagnosis Date  . Colonic polyp   . Nephrolithiasis   . Hypercholesteremia   . 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 ED (erectile dysfunction)   . Allergic rhinitis   .  Enlarged prostate     Moderately  . GERD (gastroesophageal reflux disease)    Past Surgical History  Procedure Laterality Date  . Shoulder surgery  2009    open, 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, (Jim Hooten)  . Total hip arthroplasty  09/2009    (Hooten)  . Shoulder open rotator cuff repair  2014    Hooten, partial RTC tear with probably SAD, possible DCE  . Hernia repair    . Joint replacement    . Total hip arthroplasty Left 05/20/2015    Procedure: TOTAL HIP ARTHROPLASTY;  Surgeon: Dereck Leep, MD;  Location: [facility] ORS;  Service: Orthopedics;  Laterality: Left;   Social History   Social History  . Marital Status: Single    Spouse Name: N/A  . Number of Children: N/A  . Years of Education: N/A   Occupational History  . [employer], [employer], head     Doctorate   Social History Main Topics  . Smoking status: Never Smoker   . Smokeless tobacco: Never Used  . Alcohol Use: Yes     Comment: occassional beer  . Drug Use: No  . Sexual Activity: Not on file   Other Topics Concern  . Not on file   Social History Narrative   Family History  Problem Relation Age of Onset  . Prostate cancer Father   . Migraines Sister    Allergies  Allergen Reactions  . Oxycodone Itching    And hyper feeling.  . Tramadol Itching    Medication list has been reviewed and updated.   General: Denies  fever, chills, sweats. No significant weight loss. Eyes: Denies blurring,significant itching ENT: Denies earache, sore throat, and hoarseness. Cardiovascular: Denies chest pains, palpitations, dyspnea on exertion Respiratory: Denies cough, dyspnea at rest,wheeezing Breast: no concerns about lumps GI: Denies nausea, vomiting, diarrhea, constipation, change in bowel habits, abdominal pain, melena, hematochezia GU: Denies penile discharge, ED, urinary flow / outflow problems. No STD concerns. Musculoskeletal: Denies back pain, joint pain. RECENT L HIP REPLACEMENT Derm: Denies rash, itching Neuro:  Denies  paresthesias, frequent falls, frequent headaches Psych: Denies depression, anxiety Endocrine: Denies cold intolerance, heat intolerance, polydipsia Heme: Denies enlarged lymph nodes Allergy: No hayfever  Objective:   BP 100/72 mmHg  Pulse 68  Temp(Src) 98.3 F (36.8 C) (Oral)  Ht 6' 1"  (1.854 m)  Wt 213 lb 4 oz (96.73 kg)  BMI 28.14 kg/m2 Ideal Body Weight: Weight in (lb) to have BMI = 25: 189.1  No exam data present  GEN: well developed, well nourished, no acute distress Eyes: conjunctiva and lids normal, PERRLA, EOMI ENT: TM clear, nares clear, oral exam WNL Neck: supple, no lymphadenopathy, no thyromegaly, no JVD Pulm: clear to auscultation and percussion, respiratory effort normal CV: regular rate and rhythm, S1-S2, no murmur, rub or gallop, no bruits, peripheral pulses normal and symmetric, no cyanosis, clubbing, edema or varicosities GI: soft, non-tender; no hepatosplenomegaly, masses; active bowel sounds all quadrants GU: no hernia, testicular mass, penile discharge Lymph: no cervical, axillary or inguinal adenopathy MSK: gait normal, muscle tone and strength WNL, no joint swelling, effusions, discoloration, crepitus  SKIN: clear, good turgor, color WNL, no rashes, lesions, or ulcerations Neuro: normal mental status, normal strength, sensation, and motion Psych: alert; oriented to person, place and time, normally interactive and not anxious or depressed in appearance.  All labs reviewed with patient.  Lipids:    Component Value Date/Time   CHOL 177 07/04/2014 1540   TRIG 94.0 07/04/2014 1540   HDL 57.00 07/04/2014 1540   VLDL 18.8 07/04/2014 1540   CHOLHDL 3 07/04/2014 1540   CBC: CBC Latest Ref Rng 05/22/2015 05/21/2015 04/29/2015  WBC 3.8 - 10.6 K/uL 8.6 8.1 4.6  Hemoglobin 13.0 - 18.0 g/dL 12.3(L) 12.9(L) 15.5  Hematocrit 40.0 - 52.0 % 35.9(L) 37.8(L) 46.6  Platelets 150 - 440 K/uL 129(L) 143(L) 762    Basic Metabolic Panel:    Component Value  Date/Time   NA 138 05/22/2015 0250   K 3.9 05/22/2015 0250   CL 105 05/22/2015 0250   CO2 29 05/22/2015 0250   BUN 12 05/22/2015 0250   CREATININE 0.72 05/22/2015 0250   GLUCOSE 105* 05/22/2015 0250   CALCIUM 8.4* 05/22/2015 0250   Hepatic Function Latest Ref Rng 07/04/2014 06/22/2013 04/06/2012  Total Protein 6.0 - 8.3 g/dL 7.6 6.9 7.4  Albumin 3.5 - 5.2 g/dL 4.7 4.2 4.3  AST 0 - 37 U/L 25 25 29   ALT 0 - 53 U/L 24 34 36  Alk Phosphatase 39 - 117 U/L 66 58 59  Total Bilirubin 0.2 - 1.2 mg/dL 0.6 0.9 0.6  Bilirubin, Direct 0.0 - 0.3 mg/dL 0.1 0.1 0.1    Lab Results  Component Value Date   TSH 1.23 03/12/2010   Lab Results  Component Value Date   PSA 0.79 07/04/2014   PSA 0.83 06/22/2013   PSA 0.74 04/06/2012    Assessment and Plan:   Healthcare maintenance  Screening PSA (prostate specific antigen) - Plan: PSA  Hyperlipidemia - Plan: Lipid panel  Encounter for long-term (current) use of medications - Plan:  Basic metabolic panel, CBC with Differential/Platelet, Hepatic function panel  Patient exposure to body fluids - Plan: Hepatitis C antibody, HIV antibody, GC/Chlamydia Probe Amp, RPR  Overall, doing really well  Health Maintenance Exam: The patient's preventative maintenance and recommended screening tests for an annual wellness exam were reviewed in full today. Brought up to date unless services declined.  Counselled on the importance of diet, exercise, and its role in overall health and mortality. The patient's FH and SH was reviewed, including their home life, tobacco status, and drug and alcohol status.  Follow-up: No Follow-up on file. Unless noted, follow-up in 1 year for Health Maintenance Exam.  New Prescriptions   No medications on file   Modified Medications   Modified Medication Previous Medication   FLUOCINONIDE CREAM (LIDEX) 0.05 % fluocinonide cream (LIDEX) 0.05 %      Apply bid as directed    Apply bid as directed   FLUTICASONE (FLONASE) 50  MCG/ACT NASAL SPRAY fluticasone (FLONASE) 50 MCG/ACT nasal spray      instill 2 sprays into each nostril once daily    instill 2 sprays into each nostril once daily   SILDENAFIL (REVATIO) 20 MG TABLET sildenafil (REVATIO) 20 MG tablet      Take 2 to 5 tablets by mouth 30 minutes prior to intercourse.    Take 2 to 5 tablets by mouth 30 minutes prior to intercourse.   SIMVASTATIN (ZOCOR) 40 MG TABLET simvastatin (ZOCOR) 40 MG tablet      Take 1 tablet (40 mg total) by mouth at bedtime.    Take 1 tablet (40 mg total) by mouth at bedtime.   VARDENAFIL (LEVITRA) 20 MG TABLET vardenafil (LEVITRA) 20 MG tablet      Take 1 tablet (20 mg total) by mouth daily as needed for erectile dysfunction.    Take 1 tablet (20 mg total) by mouth daily as needed for erectile dysfunction.   Orders Placed This Encounter  Procedures  . GC/Chlamydia Probe Amp  . Hepatitis C antibody  . HIV antibody  . RPR  . Lipid panel  . Basic metabolic panel  . CBC with Differential/Platelet  . Hepatic function panel  . PSA    Signed,  Frederico Hamman T. Gala Padovano, MD   Patient's Medications  New Prescriptions   No medications on file  Previous Medications   ASCORBIC ACID (VITAMIN C WITH ROSE HIPS) 1000 MG TABLET    Take 1,000 mg by mouth 2 (two) times daily.   B COMPLEX-C (SUPER B COMPLEX) TABS    Take 1 tablet by mouth 2 (two) times daily.    CALCIUM CARBONATE-VITAMIN D 600-400 MG-UNIT TABLET    Take 1 tablet by mouth 2 (two) times daily.   CHOLECALCIFEROL (VITAMIN D) 1000 UNITS TABLET    Take 2,000 Units by mouth daily.     FAMOTIDINE (PEPCID) 20 MG TABLET    Take 20 mg by mouth daily as needed for heartburn or indigestion.   FISH OIL-OMEGA-3 FATTY ACIDS 1000 MG CAPSULE    Take 1 g by mouth 2 (two) times daily.    KETOTIFEN (ZADITOR) 0.025 % OPHTHALMIC SOLUTION    Place 2 drops into both eyes daily as needed.   MELATONIN 1 MG TABS    Take 1 tablet by mouth at bedtime.   MISC NATURAL PRODUCTS (RED WINE EXTRACT PLUS) CAPS     Take 1 capsule by mouth 2 (two) times daily.    MULTIPLE VITAMIN (MULTIVITAMIN) TABLET    Take 1 tablet  by mouth daily.     POLYCARBOPHIL (FIBERCON) 625 MG TABLET    Take 1,875 mg by mouth 2 (two) times daily.   POLYETHYL GLYCOL-PROPYL GLYCOL (SYSTANE OP)    Apply 2 drops to eye daily as needed.   PROBIOTIC PRODUCT (PROBIOTIC DAILY PO)    Take 1 capsule by mouth at bedtime.   SAW PALMETTO 450 MG CAPS    Take 1 capsule by mouth daily.  Modified Medications   Modified Medication Previous Medication   FLUOCINONIDE CREAM (LIDEX) 0.05 % fluocinonide cream (LIDEX) 0.05 %      Apply bid as directed    Apply bid as directed   FLUTICASONE (FLONASE) 50 MCG/ACT NASAL SPRAY fluticasone (FLONASE) 50 MCG/ACT nasal spray      instill 2 sprays into each nostril once daily    instill 2 sprays into each nostril once daily   SILDENAFIL (REVATIO) 20 MG TABLET sildenafil (REVATIO) 20 MG tablet      Take 2 to 5 tablets by mouth 30 minutes prior to intercourse.    Take 2 to 5 tablets by mouth 30 minutes prior to intercourse.   SIMVASTATIN (ZOCOR) 40 MG TABLET simvastatin (ZOCOR) 40 MG tablet      Take 1 tablet (40 mg total) by mouth at bedtime.    Take 1 tablet (40 mg total) by mouth at bedtime.   VARDENAFIL (LEVITRA) 20 MG TABLET vardenafil (LEVITRA) 20 MG tablet      Take 1 tablet (20 mg total) by mouth daily as needed for erectile dysfunction.    Take 1 tablet (20 mg total) by mouth daily as needed for erectile dysfunction.  Discontinued Medications   AMOXICILLIN (AMOXIL) 500 MG CAPSULE    take 4 capsules by mouth 1 hour prior to dental appointment   ASPIRIN-ACETAMINOPHEN-CAFFEINE (EXCEDRIN MIGRAINE) 250-250-65 MG TABLET    Take 2 tablets by mouth every 6 (six) hours as needed for headache.   ENOXAPARIN (LOVENOX) 40 MG/0.4ML INJECTION    Inject 0.4 mLs (40 mg total) into the skin daily.   SCAR TREATMENT PRODUCTS (MEDERMA EX)    Apply 1 application topically daily as needed.   TRAMADOL (ULTRAM) 50 MG TABLET     Take 1-2 tablets (50-100 mg total) by mouth every 4 (four) hours as needed for moderate pain.

## 2015-07-11 ENCOUNTER — Encounter: Payer: BC Managed Care – PPO | Admitting: Family Medicine

## 2015-07-11 LAB — GC/CHLAMYDIA PROBE AMP
CT Probe RNA: NOT DETECTED
GC Probe RNA: NOT DETECTED

## 2015-07-11 LAB — PSA: PSA: 1.48 ng/mL (ref 0.10–4.00)

## 2015-07-11 LAB — RPR

## 2015-07-11 MED ORDER — SILDENAFIL CITRATE 20 MG PO TABS: ORAL_TABLET | ORAL | Status: DC

## 2015-07-11 NOTE — Addendum Note (Signed)
Addended by: Carter Kitten on: 07/11/2015 11:07 AM   Modules accepted: Orders

## 2015-07-12 ENCOUNTER — Telehealth: Payer: Self-pay | Admitting: Family Medicine

## 2015-07-12 ENCOUNTER — Encounter: Payer: Self-pay | Admitting: *Deleted

## 2015-07-12 NOTE — Telephone Encounter (Signed)
Patient returned Donna's call. °

## 2015-07-12 NOTE — Telephone Encounter (Signed)
Joshua Lang notified prescription for Sildenafil was resent into [place].  Also notified I am dropping him a letter in the mail with his labs results but his labs were all normal.

## 2016-01-06 ENCOUNTER — Telehealth: Payer: Self-pay | Admitting: Family Medicine

## 2016-01-06 DIAGNOSIS — K4090 Unilateral inguinal hernia, without obstruction or gangrene, not specified as recurrent: Secondary | ICD-10-CM

## 2016-01-06 NOTE — Telephone Encounter (Signed)
Patient prefers a good Psychologist, sport and exercise in the [place] area.

## 2016-01-06 NOTE — Telephone Encounter (Signed)
Pt called - he had a hernia years ago, and now has a bulge in the groin area.  Pt is requesting a referral to someone who can help with this.  cb number is [phone] Thanks

## 2016-01-06 NOTE — Telephone Encounter (Signed)
He is sometimes in this area, and sometimes elsewhere. Would he like [name] to consult a local general surgeon for him?

## 2016-01-07 NOTE — Telephone Encounter (Signed)
done

## 2016-01-08 ENCOUNTER — Encounter: Payer: Self-pay | Admitting: *Deleted

## 2016-01-22 ENCOUNTER — Ambulatory Visit (INDEPENDENT_AMBULATORY_CARE_PROVIDER_SITE_OTHER): Payer: BC Managed Care – PPO | Admitting: General Surgery

## 2016-01-22 ENCOUNTER — Encounter: Payer: Self-pay | Admitting: General Surgery

## 2016-01-22 VITALS — BP 114/74 | HR 62 | Resp 12 | Ht 73.0 in | Wt 214.0 lb

## 2016-01-22 DIAGNOSIS — K4090 Unilateral inguinal hernia, without obstruction or gangrene, not specified as recurrent: Secondary | ICD-10-CM | POA: Diagnosis not present

## 2016-01-22 NOTE — Patient Instructions (Addendum)
The patient is aware to call back for any questions or concerns. Inguinal Hernia, Adult Muscles help keep everything in the body in its proper place. But if a weak spot in the muscles develops, something can poke through. That is called a hernia. When this happens in the lower part of the belly (abdomen), it is called an inguinal hernia. (It takes its name from a part of the body in this region called the inguinal canal.) A weak spot in the wall of muscles lets some fat or part of the small intestine bulge through. An inguinal hernia can develop at any age. Men get them more often than women. CAUSES  In adults, an inguinal hernia develops over time.  It can be triggered by:  Suddenly straining the muscles of the lower abdomen.  Lifting heavy objects.  Straining to have a bowel movement. Difficult bowel movements (constipation) can lead to this.  Constant coughing. This may be caused by smoking or lung disease.  Being overweight.  Being pregnant.  Working at a job that requires long periods of standing or heavy lifting.  Having had an inguinal hernia before. One type can be an emergency situation. It is called a strangulated inguinal hernia. It develops if part of the small intestine slips through the weak spot and cannot get back into the abdomen. The blood supply can be cut off. If that happens, part of the intestine may die. This situation requires emergency surgery. SYMPTOMS  Often, a small inguinal hernia has no symptoms. It is found when a healthcare provider does a physical exam. Larger hernias usually have symptoms.   In adults, symptoms may include:  A lump in the groin. This is easier to see when the person is standing. It might disappear when lying down.  In men, a lump in the scrotum.  Pain or burning in the groin. This occurs especially when lifting, straining or coughing.  A dull ache or feeling of pressure in the groin.  Signs of a strangulated hernia can  include:  A bulge in the groin that becomes very painful and tender to the touch.  A bulge that turns red or purple.  Fever, nausea and vomiting.  Inability to have a bowel movement or to pass gas. DIAGNOSIS  To decide if you have an inguinal hernia, a healthcare provider will probably do a physical examination.  This will include asking questions about any symptoms you have noticed.  The healthcare provider might feel the groin area and ask you to cough. If an inguinal hernia is felt, the healthcare provider may try to slide it back into the abdomen.  Usually no other tests are needed. TREATMENT  Treatments can vary. The size of the hernia makes a difference. Options include:  Watchful waiting. This is often suggested if the hernia is small and you have had no symptoms.  No medical procedure will be done unless symptoms develop.  You will need to watch closely for symptoms. If any occur, contact your healthcare provider right away.  Surgery. This is used if the hernia is larger or you have symptoms.  Open surgery. This is usually an outpatient procedure (you will not stay overnight in a hospital). An cut (incision) is made through the skin in the groin. The hernia is put back inside the abdomen. The weak area in the muscles is then repaired by herniorrhaphy or hernioplasty. Herniorrhaphy: in this type of surgery, the weak muscles are sewn back together. Hernioplasty: a patch or mesh is   is used to close the weak area in the abdominal wall.  Laparoscopy. In this procedure, a surgeon makes small incisions. A thin tube with a tiny video camera (called a laparoscope) is put into the abdomen. The surgeon repairs the hernia with mesh by looking with the video camera and using two long instruments. HOME CARE INSTRUCTIONS   After surgery to repair an inguinal hernia:  You will need to take pain medicine prescribed by your healthcare provider. Follow all directions carefully.  You will need  to take care of the wound from the incision.  Your activity will be restricted for awhile. This will probably include no heavy lifting for several weeks. You also should not do anything too active for a few weeks. When you can return to work will depend on the type of job that you have.  During "watchful waiting" periods, you should:  Maintain a healthy weight.  Eat a diet high in fiber (fruits, vegetables and whole grains).  Drink plenty of fluids to avoid constipation. This means drinking enough water and other liquids to keep your urine clear or pale yellow.  Do not lift heavy objects.  Do not stand for long periods of time.  Quit smoking. This should keep you from developing a frequent cough. SEEK MEDICAL CARE IF:   A bulge develops in your groin area.  You feel pain, a burning sensation or pressure in the groin. This might be worse if you are lifting or straining.  You develop a fever of more than 100.5 F (38.1 C). SEEK IMMEDIATE MEDICAL CARE IF:   Pain in the groin increases suddenly.  A bulge in the groin gets bigger suddenly and does not go down.  For men, there is sudden pain in the scrotum. Or, the size of the scrotum increases.  A bulge in the groin area becomes red or purple and is painful to touch.  You have nausea or vomiting that does not go away.  You feel your heart beating much faster than normal.  You cannot have a bowel movement or pass gas.  You develop a fever of more than 102.0 F (38.9 C).   This information is not intended to replace advice given to you by your health care provider. Make sure you discuss any questions you have with your health care provider.   Document Released: 09/13/2008 Document Revised: 07/20/2011 Document Reviewed: 10/29/2014 Elsevier Interactive Patient Education [employer].  The patient is scheduled for surgery at [facility] on 03/02/16. He will pre admit by phone. The patient is aware of date and instructions.

## 2016-01-22 NOTE — Progress Notes (Signed)
Patient ID: Joshua Lang, male   DOB: March 11, 1956, 60 y.o.   MRN: [id number]  Chief Complaint  Patient presents with  . Hernia    HPI Joshua Lang is a 60 y.o. male here today for a evaluation of a right inguinal hernia. Patient noticed this about 6 months ago. He states it is a knot that does come and go. He has been doing more gym exercises like leg lifts, sit ups etc. He has lost some inches in his waistline. Denies pain to the area. Bowels move daily.No urinary difficulties.  Plans to return to [facility] when he gets his house finished.  HPI  Past Medical History:  Diagnosis Date  . Allergic rhinitis   . 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 Colonic polyp   . ED (erectile dysfunction)   . Enlarged prostate    Moderately  . GERD (gastroesophageal reflux disease)   . Hypercholesteremia   . Nephrolithiasis     Past Surgical History:  Procedure Laterality Date  . COLONOSCOPY  2015  . HERNIA REPAIR Left 1990's  . JOINT REPLACEMENT    . SHOULDER OPEN ROTATOR CUFF REPAIR  2014   [place], partial RTC tear with probably SAD, possible DCE  . SHOULDER SURGERY  2009   open, 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, ([facility])  . TOTAL HIP ARTHROPLASTY  09/2009   ([facility])  . TOTAL HIP ARTHROPLASTY Left 05/20/2015   Procedure: TOTAL HIP ARTHROPLASTY;  Surgeon: Dereck Leep, MD;  Location: [facility] ORS;  Service: Orthopedics;  Laterality: Left;    Family History  Problem Relation Age of Onset  . Prostate cancer Father   . Migraines Sister     Social History Social History  Substance Use Topics  . Smoking status: Never Smoker  . Smokeless tobacco: Never Used  . Alcohol use Yes     Comment: occassional beer    Allergies  Allergen Reactions  . Oxycodone Itching    And hyper feeling.  . Tramadol Itching    Current Outpatient Prescriptions  Medication Sig Dispense Refill  . Ascorbic Acid (VITAMIN C WITH ROSE HIPS) 1000 MG tablet Take 1,000 mg by mouth 2 (two) times daily.    Marland Kitchen aspirin 81 MG chewable  tablet Chew by mouth daily.    . B Complex-C (SUPER B COMPLEX) TABS Take 1 tablet by mouth 2 (two) times daily.     . Calcium Carbonate-Vitamin D 600-400 MG-UNIT tablet Take 1 tablet by mouth 2 (two) times daily.    . cholecalciferol (VITAMIN D) 1000 UNITS tablet Take 2,000 Units by mouth daily.      . famotidine (PEPCID) 20 MG tablet Take 20 mg by mouth daily as needed for heartburn or indigestion.    . fish oil-omega-3 fatty acids 1000 MG capsule Take 1 g by mouth 2 (two) times daily.     . fluocinonide cream (LIDEX) 0.05 % Apply bid as directed 60 g 3  . fluticasone (FLONASE) 50 MCG/ACT nasal spray instill 2 sprays into each nostril once daily (Patient taking differently: as needed. instill 2 sprays into each nostril once daily) 16 g 11  . ketotifen (ZADITOR) 0.025 % ophthalmic solution Place 2 drops into both eyes daily as needed.    . Melatonin 1 MG TABS Take 1 tablet by mouth at bedtime.    . Misc Natural Products (RED WINE EXTRACT PLUS) CAPS Take 1 capsule by mouth 2 (two) times daily.     . Multiple Vitamin (MULTIVITAMIN)  tablet Take 1 tablet by mouth daily.      . polycarbophil (FIBERCON) 625 MG tablet Take 1,875 mg by mouth as needed.     Vladimir Faster Glycol-Propyl Glycol (SYSTANE OP) Apply 2 drops to eye daily as needed.    . Probiotic Product (PROBIOTIC DAILY PO) Take 1 capsule by mouth at bedtime.    . Saw Palmetto 450 MG CAPS Take 1 capsule by mouth daily.    . sildenafil (REVATIO) 20 MG tablet Take 2 to 5 tablets by mouth 30 minutes prior to intercourse. 50 tablet 11  . simvastatin (ZOCOR) 40 MG tablet Take 1 tablet (40 mg total) by mouth at bedtime. 30 tablet 11  . vardenafil (LEVITRA) 20 MG tablet Take 1 tablet (20 mg total) by mouth daily as needed for erectile dysfunction. 10 tablet 5   No current facility-administered medications for this visit.     Review of Systems Review of Systems  Constitutional: Negative.   Respiratory: Negative.   Cardiovascular: Negative.    Gastrointestinal: Negative for constipation, diarrhea and vomiting.    Blood pressure 114/74, pulse 62, resp. rate 12, height 6\' 1"  (1.854 m), weight 214 lb (97.1 kg).  Physical Exam Physical Exam  Constitutional: He is oriented to person, place, and time. He appears well-developed and well-nourished.  HENT:  Mouth/Throat: Oropharynx is clear and moist.  Eyes: Conjunctivae are normal. No scleral icterus.  Neck: Neck supple.  Cardiovascular: Normal rate, regular rhythm and normal heart sounds.   Pulmonary/Chest: Effort normal and breath sounds normal.  Abdominal: Soft. Normal appearance. There is no tenderness. A hernia is present. Hernia confirmed positive in the right inguinal area. Hernia confirmed negative in the left inguinal area.  reducible right inguinal hernia  Lymphadenopathy:    He has no cervical adenopathy.  Neurological: He is alert and oriented to person, place, and time.  Skin: Skin is warm and dry.  Psychiatric: His behavior is normal.    Data Reviewed Laboratory studies of 07/10/2015 including a CBC, basic metabolic panel and liver function panel were reviewed. All normal.  Assessment    Symptomatic right inguinal hernia.    Plan    Indications for elective repair reviewed. Activity restrictions immediate postop reviewed.    Hernia precautions and incarceration were discussed with the patient. If they develop symptoms of an incarcerated hernia, they were encouraged to seek prompt medical attention.  I have recommended repair of the hernia using mesh on an outpatient basis in the near future. The risk of infection was reviewed. The role of prosthetic mesh to minimize the risk of recurrence was reviewed.  The patient is scheduled for surgery at [facility] on 03/02/16. He will pre admit by phone. The patient is aware of date and instructions.   This information has been scribed by Karie Fetch RN, BSN,BC.   Joshua Lang 01/23/2016, 8:07 AM

## 2016-01-23 DIAGNOSIS — K469 Unspecified abdominal hernia without obstruction or gangrene: Secondary | ICD-10-CM | POA: Insufficient documentation

## 2016-01-23 NOTE — Addendum Note (Signed)
Addended by: Robert Bellow on: 01/23/2016 08:10 AM   Modules accepted: Orders, SmartSet

## 2016-01-28 ENCOUNTER — Ambulatory Visit (INDEPENDENT_AMBULATORY_CARE_PROVIDER_SITE_OTHER): Payer: BC Managed Care – PPO | Admitting: Family Medicine

## 2016-01-28 ENCOUNTER — Encounter: Payer: Self-pay | Admitting: Family Medicine

## 2016-01-28 VITALS — BP 100/72 | HR 60 | Temp 98.1°F | Wt 211.4 lb

## 2016-01-28 DIAGNOSIS — N410 Acute prostatitis: Secondary | ICD-10-CM

## 2016-01-28 DIAGNOSIS — R3915 Urgency of urination: Secondary | ICD-10-CM

## 2016-01-28 LAB — POC URINALSYSI DIPSTICK (AUTOMATED)
BILIRUBIN UA: NEGATIVE
Blood, UA: NEGATIVE
GLUCOSE UA: NEGATIVE
Ketones, UA: NEGATIVE
LEUKOCYTES UA: NEGATIVE
NITRITE UA: NEGATIVE
Protein, UA: NEGATIVE
Spec Grav, UA: 1.015
Urobilinogen, UA: 0.2
pH, UA: 7.5

## 2016-01-28 MED ORDER — CIPROFLOXACIN HCL 500 MG PO TABS: 500.0000 mg | ORAL_TABLET | Freq: Two times a day (BID) | ORAL | 0 refills | Status: DC

## 2016-01-28 NOTE — Patient Instructions (Signed)
I would advise 6 weeks of treatment for prostatitis though you are welcome to talk with Dr. Lorelei Pont perhaps 3 weeks from now to decide on prolonged treatment  Sending urine for culture- if we find out there is resistance to antibiotic we will call you   Prostatitis The prostate gland is about the size and shape of a walnut. It is located just below your bladder. It produces one of the components of semen, which is made up of sperm and the fluids that help nourish and transport it out from the testicles. Prostatitis is inflammation of the prostate gland.  There are four types of prostatitis:  Acute bacterial prostatitis. This is the least common type of prostatitis. It starts quickly and usually is associated with a bladder infection, high fever, and shaking chills. It can occur at any age.  Chronic bacterial prostatitis. This is a persistent bacterial infection in the prostate. It usually develops from repeated acute bacterial prostatitis or acute bacterial prostatitis that was not properly treated. It can occur in men of any age but is most common in middle-aged men whose prostate has begun to enlarge. The symptoms are not as severe as those in acute bacterial prostatitis. Discomfort in the part of your body that is in front of your rectum and below your scrotum (perineum), lower abdomen, or in the head of your penis (glans) may represent your primary discomfort.  Chronic prostatitis (nonbacterial). This is the most common type of prostatitis. It is inflammation of the prostate gland that is not caused by a bacterial infection. The cause is unknown and may be associated with a viral infection or autoimmune disorder.  Prostatodynia (pelvic floor disorder). This is associated with increased muscular tone in the pelvis surrounding the prostate. CAUSES The causes of bacterial prostatitis are bacterial infection. The causes of the other types of prostatitis are unknown.  SYMPTOMS  Symptoms can vary  depending upon the type of prostatitis that exists. There can also be overlap in symptoms. Possible symptoms for each type of prostatitis are listed below. Acute Bacterial Prostatitis  Painful urination.  Fever or chills.  Muscle or joint pains.  Low back pain.  Low abdominal pain.  Inability to empty bladder completely. Chronic Bacterial Prostatitis, Chronic Nonbacterial Prostatitis, and Prostatodynia  Sudden urge to urinate.  Frequent urination.  Difficulty starting urine stream.  Weak urine stream.  Discharge from the urethra.  Dribbling after urination.  Rectal pain.  Pain in the testicles, penis, or tip of the penis.  Pain in the perineum.  Problems with sexual function.  Painful ejaculation.  Bloody semen. DIAGNOSIS  In order to diagnose prostatitis, your health care provider will ask about your symptoms. One or more urine samples will be taken and tested (urinalysis). If the urinalysis result is negative for bacteria, your health care provider may use a finger to feel your prostate (digital rectal exam). This exam helps your health care provider determine if your prostate is swollen and tender. It will also produce a specimen of semen that can be analyzed. TREATMENT  Treatment for prostatitis depends on the cause. If a bacterial infection is the cause, it can be treated with antibiotic medicine. In cases of chronic bacterial prostatitis, the use of antibiotics for up to 1 month or 6 weeks may be necessary. Your health care provider may instruct you to take sitz baths to help relieve pain. A sitz bath is a bath of hot water in which your hips and buttocks are under water. This relaxes  the pelvic floor muscles and often helps to relieve the pressure on your prostate. HOME CARE INSTRUCTIONS   Take all medicines as directed by your health care provider.  Take sitz baths as directed by your health care provider. SEEK MEDICAL CARE IF:   Your symptoms get worse, not  better.  You have a fever. SEEK IMMEDIATE MEDICAL CARE IF:   You have chills.  You feel nauseous or vomit.  You feel lightheaded or faint.  You are unable to urinate.  You have blood or blood clots in your urine. MAKE SURE YOU:  Understand these instructions.  Will watch your condition.  Will get help right away if you are not doing well or get worse.   This information is not intended to replace advice given to you by your health care provider. Make sure you discuss any questions you have with your health care provider.   Document Released: 04/24/2000 Document Revised: 05/18/2014 Document Reviewed: 11/14/2012 Elsevier Interactive Patient Education [employer].

## 2016-01-28 NOTE — Progress Notes (Signed)
Subjective:  OVERTON SAMBERG is a 60 y.o. year old very pleasant male patient who presents for/with See problem oriented charting ROS- see ROS included in HPI as well.   Past Medical History-  Patient Active Problem List   Diagnosis Date Noted  . Hernia of abdominal cavity 01/23/2016  . Degenerative joint disease (DJD) of hip 05/20/2015  . S/P total hip arthroplasty 05/20/2015  . FATIGUE 03/12/2010  . ALLERGIC RHINITIS 02/17/2009  . 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 UNEQUAL LEG LENGTH 02/13/2009  . SLEEP APNEA 02/13/2009  . COLONIC POLYPS 10/30/2008  . HYPERCHOLESTEROLEMIA 10/30/2008  . NEPHROLITHIASIS 10/30/2008  . DEGENERATIVE JOINT DISEASE, HIPS 10/30/2008    Medications- reviewed and updated Current Outpatient Prescriptions  Medication Sig Dispense Refill  . Ascorbic Acid (VITAMIN C WITH ROSE HIPS) 1000 MG tablet Take 1,000 mg by mouth 2 (two) times daily.    Marland Kitchen aspirin 81 MG chewable tablet Chew by mouth daily.    . B Complex-C (SUPER B COMPLEX) TABS Take 1 tablet by mouth 2 (two) times daily.     . Calcium Carbonate-Vitamin D 600-400 MG-UNIT tablet Take 1 tablet by mouth 2 (two) times daily.    . cholecalciferol (VITAMIN D) 1000 UNITS tablet Take 2,000 Units by mouth daily.      . famotidine (PEPCID) 20 MG tablet Take 20 mg by mouth daily as needed for heartburn or indigestion.    . fish oil-omega-3 fatty acids 1000 MG capsule Take 1 g by mouth 2 (two) times daily.     . fluocinonide cream (LIDEX) 0.05 % Apply bid as directed 60 g 3  . fluticasone (FLONASE) 50 MCG/ACT nasal spray instill 2 sprays into each nostril once daily (Patient taking differently: as needed. instill 2 sprays into each nostril once daily) 16 g 11  . ketotifen (ZADITOR) 0.025 % ophthalmic solution Place 2 drops into both eyes daily as needed.    . Melatonin 1 MG TABS Take 1 tablet by mouth at bedtime.    . Misc Natural Products (RED WINE EXTRACT PLUS) CAPS Take 1 capsule by mouth 2 (two) times  daily.     . Multiple Vitamin (MULTIVITAMIN) tablet Take 1 tablet by mouth daily.      . polycarbophil (FIBERCON) 625 MG tablet Take 1,875 mg by mouth as needed.     Vladimir Faster Glycol-Propyl Glycol (SYSTANE OP) Apply 2 drops to eye daily as needed.    . Probiotic Product (PROBIOTIC DAILY PO) Take 1 capsule by mouth at bedtime.    . Saw Palmetto 450 MG CAPS Take 1 capsule by mouth daily.    . sildenafil (REVATIO) 20 MG tablet Take 2 to 5 tablets by mouth 30 minutes prior to intercourse. 50 tablet 11  . simvastatin (ZOCOR) 40 MG tablet Take 1 tablet (40 mg total) by mouth at bedtime. 30 tablet 11  . vardenafil (LEVITRA) 20 MG tablet Take 1 tablet (20 mg total) by mouth daily as needed for erectile dysfunction. 10 tablet 5  . ciprofloxacin (CIPRO) 500 MG tablet Take 1 tablet (500 mg total) by mouth 2 (two) times daily. 84 tablet 0   No current facility-administered medications for this visit.     Objective: BP 100/72 (BP Location: Left Arm, Patient Position: Sitting, Cuff Size: Large)   Pulse 60   Temp 98.1 F (36.7 C) (Oral)   Wt 211 lb 6.4 oz (95.9 kg)   SpO2 97%   BMI 27.89 kg/m  Gen: NAD, resting comfortably CV: RRR no  murmurs rubs or gallops Lungs: CTAB no crackles, wheeze, rhonchi Abdomen: soft/nontender/nondistended/normal bowel sounds. No rebound or guarding.  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 For last 4-5 days he has noted progressive irritation with urinating with some burning, progressive urency. He feels tired and run down. He continues to exercise. He denies in recent memory such as even 10 years having prolonged 6 week course of antibiotics- last visit he believes he only took the  3 weeks and symptoms resolved ROS- no fever, chills, nausea, vomiting.  A/P: Patient with prior episodes of prostatitis and appears to have a new acute case (very tender prostate, LUTS). We will plan on 6 weeks of cipro. Get urine culture but UA not impressive and may be isolated to prostate.   Advised 3 weeks with PCP or sooner if worsening or not improving  Orders Placed This Encounter  Procedures  . Urine culture  . POCT Urinalysis Dipstick (Automated)    Meds ordered this encounter  Medications  . ciprofloxacin (CIPRO) 500 MG tablet    Sig: Take 1 tablet (500 mg total) by mouth 2 (two) times daily.    Dispense:  84 tablet    Refill:  0    Return precautions advised.  Garret Reddish, MD

## 2016-01-28 NOTE — Progress Notes (Signed)
Pre visit review using our clinic review tool, if applicable. No additional management support is needed unless otherwise documented below in the visit note. 

## 2016-01-30 LAB — URINE CULTURE: Organism ID, Bacteria: NO GROWTH

## 2016-02-05 ENCOUNTER — Telehealth: Payer: Self-pay | Admitting: Family Medicine

## 2016-02-05 NOTE — Telephone Encounter (Signed)
Pt is returning jaime call

## 2016-02-06 NOTE — Telephone Encounter (Signed)
Please [phone]

## 2016-02-06 NOTE — Telephone Encounter (Signed)
Spoke with patient.

## 2016-02-18 ENCOUNTER — Telehealth: Payer: Self-pay

## 2016-02-18 NOTE — Telephone Encounter (Signed)
Patient called to reschedule his surgery for 03/02/16 due to a family emergency. He is no rescheduled for surgery at [facility] on 03/04/16. He will pre admit by phone, the patient is aware of date and instructions.

## 2016-02-21 ENCOUNTER — Encounter: Payer: Self-pay | Admitting: *Deleted

## 2016-02-21 ENCOUNTER — Encounter
Admission: RE | Admit: 2016-02-21 | Discharge: 2016-02-21 | Disposition: A | Discharge status: Home / Self Care | Payer: BC Managed Care – PPO | Source: Ambulatory Visit | Attending: General Surgery | Admitting: General Surgery

## 2016-02-21 HISTORY — DX: Personal history of urinary calculi: Z87.442

## 2016-02-21 NOTE — Patient Instructions (Signed)
  Your procedure is scheduled on: 03-04-16 Report to Same Day Surgery 2nd floor medical mall To find out your arrival time please call [phone] between 1PM - 3PM on 03-03-16  Remember: Instructions that are not followed completely may result in serious medical risk, up to and including death, or upon the discretion of your surgeon and anesthesiologist your surgery may need to be rescheduled.    _x___ 1. Do not eat food or drink liquids after midnight. No gum chewing or hard candies.     __x__ 2. No Alcohol for 24 hours before or after surgery.   __x__3. No Smoking for 24 prior to surgery.   ____  4. Bring all medications with you on the day of surgery if instructed.    __x__ 5. Notify your doctor if there is any change in your medical condition     (cold, fever, infections).     Do not wear jewelry, make-up, hairpins, clips or nail polish.  Do not wear lotions, powders, or perfumes. You may wear deodorant.  Do not shave 48 hours prior to surgery. Men may shave face and neck.  Do not bring valuables to the hospital.    [facility] is not responsible for any belongings or valuables.               Contacts, dentures or bridgework may not be worn into surgery.  Leave your suitcase in the car. After surgery it may be brought to your room.  For patients admitted to the hospital, discharge time is determined by your treatment team.   Patients discharged the day of surgery will not be allowed to drive home.    Please read over the following fact sheets that you were given:   [facility] Preparing for Surgery and or MRSA Information   _x___ Take these medicines the morning of surgery with A SIP OF WATER:    1. PEPCID  2. TAKE A PEPCID AT BEDTIME THE NIGHT BEFORE SURGERY  3.  4.  5.  6.  ____Fleets enema or Magnesium Citrate as directed.   ____ Use CHG Soap or sage wipes as directed on instruction sheet   ____ Use inhalers on the day of surgery and bring to hospital day of  surgery  ____ Stop metformin 2 days prior to surgery    ____ Take 1/2 of usual insulin dose the night before surgery and none on the morning of surgery.   ____ Stop aspirin or coumadin, or plavix-OK TO CONTINUE 81 MG ASA-DO NOT TAKE AM OF SURGERY  __ Stop Anti-inflammatories such as Advil, Aleve, Ibuprofen, Motrin, Naproxen,          Naprosyn, Goodies powders or aspirin products. Ok to take Tylenol.   __X__ Stop supplements until after surgery 7 DAYS PRIOR-STOP VIT C, BO COMPLEX, FISH OIL, MELATONIN, RED WINE EXTRACT, PROBIOTIC AND SAW PALMETTO  ____ Bring C-Pap to the hospital.

## 2016-02-24 ENCOUNTER — Telehealth: Payer: Self-pay | Admitting: General Surgery

## 2016-02-24 NOTE — Telephone Encounter (Signed)
PATIENT CALLED AND WAS INQUIRING ABOUT HOME CARE AFTER HIS HERNIA SURGERY SCHEDULED FOR 03-04-16.HIS SISTER WHO WAS GOING TO HELP HIM IS UNABLE TO.PLEASE ADVISE PATIENT OF HIS OPTIONS.PLEASE CALL [phone].

## 2016-02-25 NOTE — Telephone Encounter (Signed)
I was able to talk with the patient and he states that his sister that was planning on helping him has had a concussion. He was inquiring about staying overnight? He has talked with Melissa at [facility] and a letter is needed for him to be able to stay with insurance authorization. thanks

## 2016-02-26 ENCOUNTER — Telehealth: Payer: Self-pay | Admitting: *Deleted

## 2016-02-26 NOTE — Telephone Encounter (Signed)
In discussion with the patient, we've decided to postpone his nonemergent surgery until one of the sisters is free to provide assistance. Tentatively scheduled for 03/24/2016.

## 2016-02-26 NOTE — Telephone Encounter (Signed)
Patient's surgery has been rescheduled from 03-04-16 to 03-24-16 due to transportation issues.

## 2016-02-26 NOTE — Pre-Procedure Instructions (Signed)
Karlyne Greenspan, MD - 02/24/2016 10:00 AM EDT Formatting of this note may be different from the original. [facility] [place] [facility], [address]  Office Visit  Patient Name: Joshua Lang  Date of Visit: 02/24/2016   Date of Birth: [birth year]  Assessment/Plan:   1. Chronic prostatitis Most likely an inflammatory and not infectious prostatitis as his initial UA and culture were negative. Urinalysis today is clear. Have recommended he discontinue the ciprofloxacin. If he has recurrent symptoms will give a trial of alfuzosin. Continue annual follow-up and return as needed for any significant change in his symptoms.   History of Present Illness:   Chief Complaint: Prostatitis  The patient is a 60 y.o. male who presents today for annual follow-up. He has a history of recurrent chronic prostatitis. He states he was seen in [place] on 01/25/2016 complaining of urinary frequency, urgency and mild dysuria. He states his prostate was very tender. Urinalysis at that visit was negative and culture was subsequently negative. He was started on a 6 week course of Cipro and he states he is currently in his fifth week. He has had minimal improvement in his symptoms. He denied fever, chills, nausea or vomiting. PSA 07/10/2015 was 1.48.  Allergies Antihistamines - alkylamine; Oxycodone; and Tramadol  Medications  Outpatient Encounter Prescriptions as of 02/24/2016  Medication Sig Dispense Refill  . ASCORBATE CALCIUM (VITAMIN C ORAL) Take by mouth.  Marland Kitchen ascorbic acid, vitamin C, (VITAMIN C) 1000 MG tablet Take 1,000 mg by mouth.  . ASPIRIN ORAL Take 81 mg by mouth.  Marland Kitchen aspirin-acetaminophen-caffeine (EXCEDRIN MIGRAINE) 250-250-65 mg per tablet Take 250 tablets by mouth.  Marland Kitchen b complex vitamins tablet Take 1 tablet by mouth.  . calcium carbonate (CALCIUM 600) 600 mg (1,500 mg) tablet  . calcium polycarbophil (FIBER, CALCIUM POLYCARBOPHIL,) 625 mg tablet Take 1,875 mg by  mouth.  . cholecalciferol, vitamin D3, (VITAMIN D3) 1,000 unit capsule Take 1,000 Units by mouth daily.  . ciprofloxacin HCl (CIPRO) 500 MG tablet Take 500 mg by mouth.  . famotidine (PEPCID) 20 MG tablet Take 20 mg by mouth.  . fluocinonide (LIDEX) 0.05 % cream Apply 1 application topically Two (2) times a day.  . fluticasone (FLOVENT DISKUS) 50 mcg/actuation diskus inhaler Inhale 1 puff Two (2) times a day.  . ketotifen (ZADITOR) 0.025 % ophthalmic solution 1 drop Two (2) times a day.  . L GASSERI/B BIFIDUM/B LONGUM (PROBIOTIC COLON CARE ORAL) Take by mouth.  . melatonin 1 mg Tab tablet Take 1 mg by mouth.  . multivitamin (THERAGRAN) per tablet Take 1 tablet by mouth daily.  . naproxen sodium (ALEVE) 220 MG tablet Take 220 mg by mouth.  . omega-3 acid ethyl esters (LOVAZA) 1 gram capsule Take 2 g by mouth.  . OMEGA-3/DHA/EPA/FISH OIL (FISH OIL-OMEGA-3 FATTY ACIDS) 300-1,000 mg capsule Take 2 g by mouth daily.  Marland Kitchen propylene glycol (SYSTANE BALANCE) 0.6 % Drop Apply to eye.  Marland Kitchen RESVER/WINE/BFL/GRPSD/PC/C/GRP (RED WINE COMPLEX ORAL) Take by mouth.  . RESVER/WINE/BFL/GRPSD/PC/C/POM (RED WINE EXTRACT ORAL) Take by mouth.  . saw palmetto fruit 450 mg cap Take by mouth.  . SAW PALMETTO ORAL Take by mouth.  . sildenafil (REVATIO) 20 mg tablet Take 2 to 5 tablets by mouth 30 minutes prior to intercourse.  . simvastatin (ZOCOR) 40 MG tablet Take 40 mg by mouth.  . [DISCONTINUED] vardenafil (LEVITRA) 10 MG tablet Take 10 mg by mouth.  Marland Kitchen GLUCOSAMINE SULFATE (GLUCOSAMINE ORAL) Take by mouth.  . niacin-simvastatin [facility])  500-20 mg 24 hr tablet Take 1 tablet by mouth nightly.   No facility-administered encounter medications on file as of 02/24/2016.   Past Medical History Past Medical History:  Diagnosis Date  . Calculus of kidney  . Chronic prostatitis  . Urinary tract infection, site not specified   Past Surgical History Past Surgical History:  Procedure Laterality Date  . HERNIA REPAIR  .  ROTATOR CUFF REPAIR  . TOTAL HIP ARTHROPLASTY   Family History family history includes Hyperlipidemia in his father and mother; Nephrolithiasis in his father; Prostate cancer in his father.  Social History: Tobacco use: reports that he has never smoked. He has never used smokeless tobacco. Alcohol use: reports that he drinks alcohol. Drug use: reports that he does not use drugs.  Review of Systems A 12 point review of systems was negative except for pertinent items noted in the HPI and on the patient intake form. The form has been reviewed with the patient or caregiver.  Objective:   Vital Signs Vitals:  02/24/16 0954  BP: 125/77  Pulse: 60  Weight: 95.7 kg (211 lb)  Height: 185.4 cm (6\' 1" )   Physical Exam Mental Status- Alert. Cooperative, Consistent with stated age. Well-nourished, Well developed. Gait- Normal. Hydration- Well hydrated. Voice- Normal.  Integumentary: General Characteristics:No rashes. Normal coloration of skin. Normal skin moisture.  Head and Neck: Global Assessment- atraumatic, normocephalic, atraumatic with no gross lesions. No abnormal facial aesthetics. No abnormal movements. Eyeball- Bilateral- Normal. Sclera/Conjunctiva- Bilateral- normal. Nose - symmetric. Nares- symmetric. Lips: Normal color, moist, no gross lesions.  Chest and Lung Exam: Chest - normal excursion with symmetric chest walls, quiet, even and easy respiratory effort with no use of accessory muscles.   Abdomen: General: Soft, Nontender  Rectal: External: Normal external exam. Internal: Normal internal exam, Normal sphincter tone. Prostate: Mildly tender, Moderate pelvic floor tenderness. Size: 40 cm. Seminal vesical: Nonpalpable.  Neurologic: Motor and sensory grossly intact. Affect- normal and appropriate. Speech- Normal. Thought content- Normal. Cognitive function- Normal. Coordination- Normal.   Musculoskeletal: Posture- Normal. Musculoskeletal  strength and tone grossly normal.  Lymphatic: No Generalized lymphadenopathy. Peripheral edema: None.  Test Results Results for orders placed or performed in visit on 02/24/16  POCT Urinalysis Dipstick  Result Value Ref Range  Spec Gravity/POC 1.020 1.003 - 1.030  PH/POC 6.0 5.0 - 9.0  Leuk Esterase/POC Negative Negative  Nitrite/POC Negative Negative  Protein/POC Negative Negative  UA Glucose/POC Negative Negative  Ketones, POC Negative Negative  Bilirubin/POC Negative Negative  Blood/POC Negative Negative  Urobilinogen/POC 0.2 0.2 - 1.0 mg/dL  OPERATOR ID Muliis, Rosemarie Ax [facility]      Plan of Treatment - as of this encounter  Not on file   Lab Results - in this encounter    POCT URINALYSIS DIPSTICK, INTERFACED (02/24/2016 9:10 AM) POCT URINALYSIS DIPSTICK, INTERFACED (02/24/2016 9:10 AM)  Component Value Ref Range  Spec Gravity/POC 1.020 1.003 - 1.030  PH/POC 6.0 5.0 - 9.0  Leuk Esterase/POC Negative Negative  Nitrite/POC Negative Negative  Protein/POC Negative Negative  UA Glucose/POC Negative Negative  Ketones, POC Negative Negative  Bilirubin/POC Negative Negative  Blood/POC Negative Negative  Urobilinogen/POC 0.2 0.2 - 1.0 mg/dL  OPERATOR ID Muliis, Susan    POCT URINALYSIS DIPSTICK, INTERFACED (02/24/2016 9:10 AM)  Specimen Performing Laboratory  [facility]  [address], [place], [place] [address]     Visit Diagnoses    Diagnosis  Chronic prostatitis - Primary  Discontinued Medications - as of this encounter   Prescription Sig. Discontinue Reason Start Date End Date  vardenafil (LEVITRA) 10 MG tablet  Take 10 mg by mouth. No Longer Taking  02/24/2016  ciprofloxacin HCl (CIPRO) 500 MG tablet  Take 500 mg by mouth. Alternate therapy 01/28/2016 02/25/2016   Historical Medications - added in this encounter   This list may reflect changes made after this  encounter.  Medication Sig. Disp. Refills Start Date End Date  propylene glycol (SYSTANE BALANCE) 0.6 % Drop  Apply to eye.      naproxen sodium (ALEVE) 220 MG tablet  Take 220 mg by mouth.      melatonin 1 mg Tab tablet  Take 1 mg by mouth.      famotidine (PEPCID) 20 MG tablet  Take 20 mg by mouth.      aspirin-acetaminophen-caffeine (EXCEDRIN MIGRAINE) 250-250-65 mg per tablet  Take 250 tablets by mouth.      ciprofloxacin HCl (CIPRO) 500 MG tablet  Take 500 mg by mouth.   01/28/2016 02/25/2016   Document Information   Primary Care Provider Maud Deed Copland MD (Oct. 06, 2014 - Present)  Document Coverage Dates Oct. 16, 2017 - Oct. 17, 2017  [facility] [address] [place], [place] [address]   Encounter Providers Karlyne Greenspan MD (Attending) tel:[phone] (Work) fax:[phone] [address], [place] [address]   Encounter Date Oct. 16, 2017

## 2016-03-04 ENCOUNTER — Telehealth: Payer: Self-pay

## 2016-03-04 NOTE — Telephone Encounter (Signed)
Pt left v/m; several months ago and one week ago in the middle of the night pt awoke like he was choking; when pt got up he was OK. When had choking feeling pt did not feel like could get air down; but when stood up could breathe OK. Only happened x 2 but pt wants to know what might have caused this and is there anything pt should do to keep from happening again. Pt request cb. Last annual exam 07/10/15.

## 2016-03-04 NOTE — Telephone Encounter (Signed)
I am not entirely sure - does he wear a CPAP machine or other appliance for sleep apnea?

## 2016-03-04 NOTE — Telephone Encounter (Signed)
Left v/m requesting pt to cb. 

## 2016-03-05 NOTE — Telephone Encounter (Signed)
Left message for Joshua Lang to return my call. 

## 2016-03-06 NOTE — Telephone Encounter (Signed)
Spoke with Joshua Lang.  He does not wear a CPAP or other appliance for sleep apnea.  He is agreeable to doing a sleep study if that is what Dr. Lorelei Pont recommends.  Please advise.

## 2016-03-10 ENCOUNTER — Other Ambulatory Visit: Payer: Self-pay | Admitting: Family Medicine

## 2016-03-10 DIAGNOSIS — G4730 Sleep apnea, unspecified: Secondary | ICD-10-CM

## 2016-03-10 NOTE — Telephone Encounter (Signed)
done

## 2016-03-20 ENCOUNTER — Telehealth: Payer: Self-pay | Admitting: General Surgery

## 2016-03-20 NOTE — Telephone Encounter (Signed)
SHERRY WITH PRE-ADMIT CALLED TO SAY PT WILL NEED AN UPDATED H&P FOR HIS UPCOMING SURGERY/MTH

## 2016-03-20 NOTE — Telephone Encounter (Signed)
H&P will be updated the morning of surgery.

## 2016-03-24 ENCOUNTER — Ambulatory Visit: Payer: BC Managed Care – PPO | Admitting: Anesthesiology

## 2016-03-24 ENCOUNTER — Ambulatory Visit
Admission: RE | Admit: 2016-03-24 | Discharge: 2016-03-24 | Disposition: A | Discharge status: Home / Self Care | Payer: BC Managed Care – PPO | Source: Ambulatory Visit | Attending: General Surgery | Admitting: General Surgery

## 2016-03-24 ENCOUNTER — Encounter
Admission: RE | Disposition: A | Discharge status: Home / Self Care | Payer: Self-pay | Source: Ambulatory Visit | Attending: General Surgery

## 2016-03-24 ENCOUNTER — Encounter: Payer: Self-pay | Admitting: *Deleted

## 2016-03-24 DIAGNOSIS — M1990 Unspecified osteoarthritis, unspecified site: Secondary | ICD-10-CM | POA: Diagnosis not present

## 2016-03-24 DIAGNOSIS — Z7982 Long term (current) use of aspirin: Secondary | ICD-10-CM | POA: Diagnosis not present

## 2016-03-24 DIAGNOSIS — K219 Gastro-esophageal reflux disease without esophagitis: Secondary | ICD-10-CM | POA: Insufficient documentation

## 2016-03-24 DIAGNOSIS — K4090 Unilateral inguinal hernia, without obstruction or gangrene, not specified as recurrent: Secondary | ICD-10-CM

## 2016-03-24 DIAGNOSIS — E7800 Pure hypercholesterolemia, unspecified: Secondary | ICD-10-CM | POA: Insufficient documentation

## 2016-03-24 DIAGNOSIS — Z96642 Presence of left artificial hip joint: Secondary | ICD-10-CM | POA: Diagnosis not present

## 2016-03-24 HISTORY — PX: INGUINAL HERNIA REPAIR: SHX194

## 2016-03-24 SURGERY — REPAIR, HERNIA, INGUINAL, ADULT
Anesthesia: General | Laterality: Right | Wound class: Clean

## 2016-03-24 MED ORDER — ONDANSETRON HCL 4 MG/2ML IJ SOLN
INTRAMUSCULAR | Status: DC | PRN
  Administered 2016-03-24: 4 mg via INTRAVENOUS

## 2016-03-24 MED ORDER — CEFAZOLIN SODIUM-DEXTROSE 2-4 GM/100ML-% IV SOLN
2.0000 g | INTRAVENOUS | Status: AC
  Administered 2016-03-24: 2 g via INTRAVENOUS

## 2016-03-24 MED ORDER — BUPIVACAINE-EPINEPHRINE (PF) 0.5% -1:200000 IJ SOLN
INTRAMUSCULAR | Status: AC
  Filled 2016-03-24: qty 30

## 2016-03-24 MED ORDER — GLYCOPYRROLATE 0.2 MG/ML IJ SOLN
INTRAMUSCULAR | Status: DC | PRN
  Administered 2016-03-24: .2 mg via INTRAVENOUS

## 2016-03-24 MED ORDER — HYDROCODONE-ACETAMINOPHEN 5-325 MG PO TABS: 1.0000 | ORAL_TABLET | ORAL | 0 refills | Status: DC | PRN

## 2016-03-24 MED ORDER — KETAMINE HCL 50 MG/ML IJ SOLN
INTRAMUSCULAR | Status: DC | PRN
  Administered 2016-03-24: 35 mg via INTRAVENOUS

## 2016-03-24 MED ORDER — DEXAMETHASONE SODIUM PHOSPHATE 4 MG/ML IJ SOLN
INTRAMUSCULAR | Status: DC | PRN
  Administered 2016-03-24: 5 mg via INTRAVENOUS

## 2016-03-24 MED ORDER — FENTANYL CITRATE (PF) 100 MCG/2ML IJ SOLN
INTRAMUSCULAR | Status: DC | PRN
  Administered 2016-03-24: 25 ug via INTRAVENOUS
  Administered 2016-03-24: 50 ug via INTRAVENOUS

## 2016-03-24 MED ORDER — FENTANYL CITRATE (PF) 100 MCG/2ML IJ SOLN: 25.0000 ug | INTRAMUSCULAR | Status: DC | PRN

## 2016-03-24 MED ORDER — MIDAZOLAM HCL 2 MG/2ML IJ SOLN
INTRAMUSCULAR | Status: DC | PRN
  Administered 2016-03-24: 2 mg via INTRAVENOUS

## 2016-03-24 MED ORDER — LACTATED RINGERS IV SOLN
INTRAVENOUS | Status: DC
  Administered 2016-03-24: 10:00:00 via INTRAVENOUS

## 2016-03-24 MED ORDER — ONDANSETRON HCL 4 MG/2ML IJ SOLN: 4.0000 mg | Freq: Once | INTRAMUSCULAR | Status: DC | PRN

## 2016-03-24 MED ORDER — LIDOCAINE HCL (CARDIAC) 20 MG/ML IV SOLN
INTRAVENOUS | Status: DC | PRN
  Administered 2016-03-24: 80 mg via INTRAVENOUS

## 2016-03-24 MED ORDER — CEFAZOLIN SODIUM-DEXTROSE 2-4 GM/100ML-% IV SOLN
INTRAVENOUS | Status: AC
  Administered 2016-03-24: 2 g via INTRAVENOUS
  Filled 2016-03-24: qty 100

## 2016-03-24 MED ORDER — PROPOFOL 10 MG/ML IV BOLUS
INTRAVENOUS | Status: DC | PRN
  Administered 2016-03-24: 170 mg via INTRAVENOUS

## 2016-03-24 MED ORDER — FAMOTIDINE 20 MG PO TABS
ORAL_TABLET | ORAL | Status: AC
  Filled 2016-03-24: qty 1

## 2016-03-24 MED ORDER — BUPIVACAINE-EPINEPHRINE (PF) 0.5% -1:200000 IJ SOLN
INTRAMUSCULAR | Status: DC | PRN
  Administered 2016-03-24: 30 mL

## 2016-03-24 SURGICAL SUPPLY — 33 items
BLADE SURG 15 STRL SS SAFETY (BLADE) ×3 IMPLANT
CANISTER SUCT 1200ML W/VALVE (MISCELLANEOUS) ×3 IMPLANT
CHLORAPREP W/TINT 26ML (MISCELLANEOUS) ×3 IMPLANT
CLOSURE WOUND 1/2 X4 (GAUZE/BANDAGES/DRESSINGS) ×1
DECANTER SPIKE VIAL GLASS SM (MISCELLANEOUS) ×3 IMPLANT
DRAIN PENROSE 1/4X12 LTX (DRAIN) ×3 IMPLANT
DRAPE LAPAROTOMY 100X77 ABD (DRAPES) ×3 IMPLANT
DRESSING TELFA 4X3 1S ST N-ADH (GAUZE/BANDAGES/DRESSINGS) ×3 IMPLANT
DRSG TEGADERM 4X4.75 (GAUZE/BANDAGES/DRESSINGS) ×3 IMPLANT
ELECT REM PT RETURN 9FT ADLT (ELECTROSURGICAL) ×3
ELECTRODE REM PT RTRN 9FT ADLT (ELECTROSURGICAL) ×1 IMPLANT
GLOVE BIO SURGEON STRL SZ7.5 (GLOVE) ×12 IMPLANT
GLOVE INDICATOR 8.0 STRL GRN (GLOVE) ×6 IMPLANT
GOWN STRL REUS W/ TWL LRG LVL3 (GOWN DISPOSABLE) ×3 IMPLANT
GOWN STRL REUS W/TWL LRG LVL3 (GOWN DISPOSABLE) ×6
KIT RM TURNOVER STRD PROC AR (KITS) ×3 IMPLANT
LABEL OR SOLS (LABEL) IMPLANT
MESH HERNIA SYS ULTRAPRO LRG (Mesh General) ×3 IMPLANT
NDL SAFETY 22GX1.5 (NEEDLE) ×3 IMPLANT
NEEDLE HYPO 25X1 1.5 SAFETY (NEEDLE) ×3 IMPLANT
PACK BASIN MINOR ARMC (MISCELLANEOUS) ×3 IMPLANT
STRIP CLOSURE SKIN 1/2X4 (GAUZE/BANDAGES/DRESSINGS) ×2 IMPLANT
SUT SURGILON 0 BLK (SUTURE) ×6 IMPLANT
SUT VIC AB 2-0 SH 27 (SUTURE) ×2
SUT VIC AB 2-0 SH 27XBRD (SUTURE) ×1 IMPLANT
SUT VIC AB 3-0 54X BRD REEL (SUTURE) ×1 IMPLANT
SUT VIC AB 3-0 BRD 54 (SUTURE) ×2
SUT VIC AB 3-0 SH 27 (SUTURE) ×2
SUT VIC AB 3-0 SH 27X BRD (SUTURE) ×1 IMPLANT
SUT VIC AB 4-0 FS2 27 (SUTURE) ×3 IMPLANT
SWABSTK COMLB BENZOIN TINCTURE (MISCELLANEOUS) ×3 IMPLANT
SYR 3ML LL SCALE MARK (SYRINGE) ×3 IMPLANT
SYR CONTROL 10ML (SYRINGE) ×3 IMPLANT

## 2016-03-24 NOTE — Op Note (Signed)
Preoperative diagnosis: Symptomatic right inguinal hernia.  Postoperative diagnosis: Same.  Operative procedure: Right direct inguinal hernia repair with large Ultra Pro mesh.  Operating surgeon: Hervey Ard, M.D.  Anesthesia: Gen. by LMA, Marcaine 0.5% with 1-200,000 epinephrine, 30 mL, Toradol 30 mg.  Estimated blood loss: Less than 5 mL.  Clinical note: This 60 year old male stability increasing symptomatic right inguinal hernia. He was admitted for elective repair. He received Kefzol prior to the procedure. Hair was removed with clippers prior to presentation to the operating theater.  Operative note: With the patient under adequate general anesthesia the right groin and abdomen was prepped with ChloraPrep and draped. Field block anesthesia was established and the skin incised for 5 cm along the anticipated course of the inguinal canal. The skin was incised sharply and the remaining dissection completed with electrocautery. The external oblique was opened in the direction of its fibers. A large direct defect was noted. Dissection at the internal ring showed no evidence of an indirect defect. The preperitoneal space was cleared. A large Ultra Pro mesh was placed and the smoothed into position. The external component was anchored to the pubic tubercle and then along the inguinal ligament with interrupted 0 Surgilon sutures. The medial and superior borders were anchored to the transverse abdominis aponeurosis. A lateral slit was made for cord passage. Both the ilioinguinal and earlier hypogastric nerves were identified and protected during the dissection. Toradol was placed in the wound. The external oblique was closed with a running 2-0 Vicryl suture. Scarpa's fascia was closed with a running 3-0 Vicryl suture. Skin was closed with a running 4-0 Vicryl subcuticular suture. Benzoin, Steri-Strips, Telfa and Tegaderm dressing applied.  The patient tolerated the procedure well and was taken to the  recovery room in stable condition.

## 2016-03-24 NOTE — H&P (Signed)
Joshua Lang [id number] [birth year]     HPI: Healthy 60 y.o male for elective right inguinal hernia repair.   Prescriptions Prior to Admission  Medication Sig Dispense Refill Last Dose  . acetaminophen (TYLENOL) 500 MG tablet Take 500-1,000 mg by mouth every 6 (six) hours as needed (for pain/headache).   03/20/2016  . aspirin 81 MG chewable tablet Chew by mouth daily.   03/23/2016 at Unknown time  . famotidine (PEPCID) 20 MG tablet Take 20 mg by mouth daily as needed for heartburn or indigestion.   03/24/2016 at 0630  . fluocinonide cream (LIDEX) 0.05 % Apply bid as directed (Patient taking differently: Apply 1 application topically 2 (two) times daily as needed (for irritated skin.). ) 60 g 3 03/21/2016  . fluticasone (FLONASE) 50 MCG/ACT nasal spray instill 2 sprays into each nostril once daily (Patient taking differently: Place 1 spray into both nostrils daily as needed for allergies. ) 16 g 11 12/23/2015  . ketotifen (ZADITOR) 0.025 % ophthalmic solution Place 2 drops into both eyes daily as needed (for dry/irritated eyes).    02/22/2016  . polycarbophil (FIBERCON) 625 MG tablet Take 1,875 mg by mouth daily as needed (for digestive regularity.).    12/23/2015  . Polyethyl Glycol-Propyl Glycol (SYSTANE OP) Apply 2 drops to eye 2 (two) times daily as needed (for dry/scratchy/irritated eyes).    01/23/2016  . sildenafil (REVATIO) 20 MG tablet Take 2 to 5 tablets by mouth 30 minutes prior to intercourse. (Patient taking differently: Take 40-100 mg by mouth daily as needed (erectile dysfunction (impotence)). Take 2 to 5 tablets by mouth 30 minutes prior to intercourse.) 50 tablet 11 03/21/2016  . simvastatin (ZOCOR) 40 MG tablet Take 1 tablet (40 mg total) by mouth at bedtime. 30 tablet 11 03/23/2016 at Unknown time  . Ascorbic Acid (VITAMIN C WITH ROSE HIPS) 1000 MG tablet Take 1,000 mg by mouth 2 (two) times daily.   03/17/2016  . B Complex-C (SUPER B COMPLEX) TABS Take 1 tablet by mouth 2 (two) times  daily.    03/17/2016  . Calcium Carbonate-Vitamin D 600-400 MG-UNIT tablet Take 1 tablet by mouth 2 (two) times daily.   03/17/2016  . cholecalciferol (VITAMIN D) 1000 UNITS tablet Take 2,000 Units by mouth daily.     03/17/2016  . ciprofloxacin (CIPRO) 500 MG tablet Take 1 tablet (500 mg total) by mouth 2 (two) times daily. (Patient not taking: Reported on 03/19/2016) 84 tablet 0 Not Taking at Unknown time  . fish oil-omega-3 fatty acids 1000 MG capsule Take 1 g by mouth 2 (two) times daily.    03/17/2016  . Melatonin 1 MG TABS Take 1 mg by mouth at bedtime as needed (for sleep).    03/17/2016  . Misc Natural Products (RED WINE EXTRACT PLUS) CAPS Take 1 capsule by mouth 2 (two) times daily.    03/17/2016  . Multiple Vitamin (MULTIVITAMIN) tablet Take 1 tablet by mouth 2 (two) times daily.    03/17/2016  . Probiotic Product (PROBIOTIC DAILY PO) Take 1 capsule by mouth at bedtime.   03/17/2016  . Saw Palmetto 450 MG CAPS Take 1 capsule by mouth 2 (two) times daily.    03/17/2016   Allergies  Allergen Reactions  . Oxycodone Itching    And hyper feeling/mind races  . Tramadol Itching   Past Medical History:  Diagnosis Date  . Allergic rhinitis   . 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 Colonic polyp   .  ED (erectile dysfunction)   . Enlarged prostate    Moderately  . GERD (gastroesophageal reflux disease)   . History of kidney stones    H/O  . Hypercholesteremia   . Nephrolithiasis   . Prostate infection    STARTED CIPRO PER UROLOGIST ON 01-28-16 AND IS TO CONTINUE FOR 6 WEEKS   Past Surgical History:  Procedure Laterality Date  . COLONOSCOPY  2015  . HERNIA REPAIR Left 1990's  . JOINT REPLACEMENT Bilateral    THR  . SHOULDER OPEN ROTATOR CUFF REPAIR  2014   [place], partial RTC tear with probably SAD, possible DCE  . SHOULDER SURGERY  2009   open, 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, (Jim Hooten)  . TOTAL HIP ARTHROPLASTY  09/2009   ([facility])  . TOTAL HIP ARTHROPLASTY Left 05/20/2015   Procedure: TOTAL HIP  ARTHROPLASTY;  Surgeon: Dereck Leep, MD;  Location: [facility] ORS;  Service: Orthopedics;  Laterality: Left;   Social History   Social History  . Marital status: Single    Spouse name: N/A  . Number of children: N/A  . Years of education: N/A   Occupational History  . [employer], [employer], head     Doctorate   Social History Main Topics  . Smoking status: Never Smoker  . Smokeless tobacco: Never Used  . Alcohol use Yes     Comment: occassional beer  . Drug use: No  . Sexual activity: Not on file   Other Topics Concern  . Not on file   Social History Narrative  . No narrative on file   Social History   Social History Narrative  . No narrative on file     ROS: Negative.     PE: HEENT: Negative. Lungs: Clear. Cardio: RR. Joshua Lang 03/24/2016   Assessment/Plan:  Proceed with planned endoscopy. Joshua Lang [id number] Sep 16, 1955     HPI:  Prescriptions Prior to Admission  Medication Sig Dispense Refill Last Dose  . acetaminophen (TYLENOL) 500 MG tablet Take 500-1,000 mg by mouth every 6 (six) hours as needed (for pain/headache).   03/20/2016  . aspirin 81 MG chewable tablet Chew by mouth daily.   03/23/2016 at Unknown time  . famotidine (PEPCID) 20 MG tablet Take 20 mg by mouth daily as needed for heartburn or indigestion.   03/24/2016 at 0630  . fluocinonide cream (LIDEX) 0.05 % Apply bid as directed (Patient taking differently: Apply 1 application topically 2 (two) times daily as needed (for irritated skin.). ) 60 g 3 03/21/2016  . fluticasone (FLONASE) 50 MCG/ACT nasal spray instill 2 sprays into each nostril once daily (Patient taking differently: Place 1 spray into both nostrils daily as needed for allergies. ) 16 g 11 12/23/2015  . ketotifen (ZADITOR) 0.025 % ophthalmic solution Place 2 drops into both eyes daily as needed (for dry/irritated eyes).    02/22/2016  . polycarbophil (FIBERCON) 625 MG tablet Take 1,875 mg by mouth daily as needed  (for digestive regularity.).    12/23/2015  . Polyethyl Glycol-Propyl Glycol (SYSTANE OP) Apply 2 drops to eye 2 (two) times daily as needed (for dry/scratchy/irritated eyes).    01/23/2016  . sildenafil (REVATIO) 20 MG tablet Take 2 to 5 tablets by mouth 30 minutes prior to intercourse. (Patient taking differently: Take 40-100 mg by mouth daily as needed (erectile dysfunction (impotence)). Take 2 to 5 tablets by mouth 30 minutes prior to intercourse.) 50 tablet 11 03/21/2016  . simvastatin (ZOCOR) 40 MG tablet Take 1 tablet (40 mg total)  by mouth at bedtime. 30 tablet 11 03/23/2016 at Unknown time  . Ascorbic Acid (VITAMIN C WITH ROSE HIPS) 1000 MG tablet Take 1,000 mg by mouth 2 (two) times daily.   03/17/2016  . B Complex-C (SUPER B COMPLEX) TABS Take 1 tablet by mouth 2 (two) times daily.    03/17/2016  . Calcium Carbonate-Vitamin D 600-400 MG-UNIT tablet Take 1 tablet by mouth 2 (two) times daily.   03/17/2016  . cholecalciferol (VITAMIN D) 1000 UNITS tablet Take 2,000 Units by mouth daily.     03/17/2016  . ciprofloxacin (CIPRO) 500 MG tablet Take 1 tablet (500 mg total) by mouth 2 (two) times daily. (Patient not taking: Reported on 03/19/2016) 84 tablet 0 Not Taking at Unknown time  . fish oil-omega-3 fatty acids 1000 MG capsule Take 1 g by mouth 2 (two) times daily.    03/17/2016  . Melatonin 1 MG TABS Take 1 mg by mouth at bedtime as needed (for sleep).    03/17/2016  . Misc Natural Products (RED WINE EXTRACT PLUS) CAPS Take 1 capsule by mouth 2 (two) times daily.    03/17/2016  . Multiple Vitamin (MULTIVITAMIN) tablet Take 1 tablet by mouth 2 (two) times daily.    03/17/2016  . Probiotic Product (PROBIOTIC DAILY PO) Take 1 capsule by mouth at bedtime.   03/17/2016  . Saw Palmetto 450 MG CAPS Take 1 capsule by mouth 2 (two) times daily.    03/17/2016   Allergies  Allergen Reactions  . Oxycodone Itching    And hyper feeling/mind races  . Tramadol Itching   Past Medical History:  Diagnosis Date  .  Allergic rhinitis   . 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 Colonic polyp   . ED (erectile dysfunction)   . Enlarged prostate    Moderately  . GERD (gastroesophageal reflux disease)   . History of kidney stones    H/O  . Hypercholesteremia   . Nephrolithiasis   . Prostate infection    STARTED CIPRO PER UROLOGIST ON 01-28-16 AND IS TO CONTINUE FOR 6 WEEKS   Past Surgical History:  Procedure Laterality Date  . COLONOSCOPY  2015  . HERNIA REPAIR Left 1990's  . JOINT REPLACEMENT Bilateral    THR  . SHOULDER OPEN ROTATOR CUFF REPAIR  2014   [place], partial RTC tear with probably SAD, possible DCE  . SHOULDER SURGERY  2009   open, 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, (Jim Hooten)  . TOTAL HIP ARTHROPLASTY  09/2009   ([facility]en)  . TOTAL HIP ARTHROPLASTY Left 05/20/2015   Procedure: TOTAL HIP ARTHROPLASTY;  Surgeon: Dereck Leep, MD;  Location: [facility] ORS;  Service: Orthopedics;  Laterality: Left;   Social History   Social History  . Marital status: Single    Spouse name: N/A  . Number of children: N/A  . Years of education: N/A   Occupational History  . [employer], [employer], head     Doctorate   Social History Main Topics  . Smoking status: Never Smoker  . Smokeless tobacco: Never Used  . Alcohol use Yes     Comment: occassional beer  . Drug use: No  . Sexual activity: Not on file   Other Topics Concern  . Not on file   Social History Narrative  . No narrative on file   Social History   Social History Narrative  . No narrative on file     ROS: Negative.     PE: HEENT: Negative. Lungs:  Clear. Cardio: RR.  Assessment/Plan:  Proceed with planned right inguinal hernia repair.   Joshua Lang 03/24/2016

## 2016-03-24 NOTE — Anesthesia Procedure Notes (Signed)
Procedure Name: LMA Insertion Date/Time: 03/24/2016 12:11 PM Performed by: Silvana Newness Pre-anesthesia Checklist: Patient identified, Emergency Drugs available, Suction available, Patient being monitored and Timeout performed Patient Re-evaluated:Patient Re-evaluated prior to inductionOxygen Delivery Method: Circle system utilized Preoxygenation: Pre-oxygenation with 100% oxygen Intubation Type: IV induction Ventilation: Mask ventilation without difficulty LMA: LMA inserted LMA Size: 5.0 Number of attempts: 1 Placement Confirmation: positive ETCO2 and breath sounds checked- equal and bilateral Tube secured with: Tape Dental Injury: Teeth and Oropharynx as per pre-operative assessment

## 2016-03-24 NOTE — OR Nursing (Signed)
Patient took pepcid at home todayl Not repeated here in SDS.

## 2016-03-24 NOTE — Discharge Instructions (Signed)
AMBULATORY SURGERY  °DISCHARGE INSTRUCTIONS ° ° °1) The drugs that you were given will stay in your system until tomorrow so for the next 24 hours you should not: ° °A) Drive an automobile °B) Make any legal decisions °C) Drink any alcoholic beverage ° ° °2) You may resume regular meals tomorrow.  Today it is better to start with liquids and gradually work up to solid foods. ° °You may eat anything you prefer, but it is better to start with liquids, then soup and crackers, and gradually work up to solid foods. ° ° °3) Please notify your doctor immediately if you have any unusual bleeding, trouble breathing, redness and pain at the surgery site, drainage, fever, or pain not relieved by medication. ° ° ° °4) Additional Instructions: ° ° ° ° ° ° ° °Please contact your physician with any problems or Same Day Surgery at [phone], Monday through Friday 6 am to 4 pm, or [place] at [place] [facility] number at [phone]. °

## 2016-03-24 NOTE — OR Nursing (Signed)
1500 Dr . Bary Castilla Ok'd dc home without visit.

## 2016-03-24 NOTE — Transfer of Care (Signed)
Immediate Anesthesia Transfer of Care Note  Patient: Joshua Lang  Procedure(s) Performed: Procedure(s): HERNIA REPAIR INGUINAL ADULT (Right)  Patient Location: PACU  Anesthesia Type:General  Level of Consciousness: sedated  Airway & Oxygen Therapy: Patient Spontanous Breathing and Patient connected to nasal cannula oxygen  Post-op Assessment: Report given to RN and Post -op Vital signs reviewed and stable  Post vital signs: Reviewed and stable  Last Vitals:  Vitals:   03/24/16 0910 03/24/16 1311  BP: 120/76   Pulse: 63   Resp: 18   Temp: 36.6 C (P) 36.6 C    Last Pain:  Vitals:   03/24/16 0910  TempSrc: Oral  PainSc: 0-No pain         Complications: No apparent anesthesia complications

## 2016-03-24 NOTE — Anesthesia Preprocedure Evaluation (Signed)
Anesthesia Evaluation  Patient identified by MRN, date of birth, ID band Patient awake    Reviewed: Allergy & Precautions, H&P , NPO status , Patient's Chart, lab work & pertinent test results, reviewed documented beta blocker date and time   Airway Mallampati: II  TM Distance: >3 FB Neck ROM: full    Dental  (+) Teeth Intact   Pulmonary neg pulmonary ROS,    Pulmonary exam normal        Cardiovascular Exercise Tolerance: Good negative cardio ROS Normal cardiovascular exam Rate:Normal     Neuro/Psych negative neurological ROS  negative psych ROS   GI/Hepatic negative GI ROS, Neg liver ROS, GERD  ,  Endo/Other  negative endocrine ROS  Renal/GU negative Renal ROS  negative genitourinary   Musculoskeletal   Abdominal   Peds  Hematology negative hematology ROS (+)   Anesthesia Other Findings   Reproductive/Obstetrics negative OB ROS                             Anesthesia Physical Anesthesia Plan  ASA: II  Anesthesia Plan: General LMA   Post-op Pain Management:    Induction:   Airway Management Planned:   Additional Equipment:   Intra-op Plan:   Post-operative Plan:   Informed Consent: I have reviewed the patients History and Physical, chart, labs and discussed the procedure including the risks, benefits and alternatives for the proposed anesthesia with the patient or authorized representative who has indicated his/her understanding and acceptance.     Plan Discussed with: CRNA  Anesthesia Plan Comments:         Anesthesia Quick Evaluation

## 2016-03-26 ENCOUNTER — Telehealth: Payer: Self-pay | Admitting: *Deleted

## 2016-03-26 NOTE — Telephone Encounter (Signed)
He called asking about showering with the dressing in place, advised that he could. He states he is getting along extremely well. Postoperative care reviewed as he read over the discharge papers. Follow up next week as scheduled.

## 2016-03-26 NOTE — Anesthesia Postprocedure Evaluation (Signed)
Anesthesia Post Note  Patient: Joshua Lang  Procedure(s) Performed: Procedure(s) (LRB): HERNIA REPAIR INGUINAL ADULT (Right)  Patient location during evaluation: PACU Anesthesia Type: General Level of consciousness: awake and alert Pain management: pain level controlled Vital Signs Assessment: post-procedure vital signs reviewed and stable Respiratory status: spontaneous breathing, nonlabored ventilation, respiratory function stable and patient connected to nasal cannula oxygen Cardiovascular status: blood pressure returned to baseline and stable Postop Assessment: no signs of nausea or vomiting Anesthetic complications: no    Last Vitals:  Vitals:   03/24/16 1421 03/24/16 1511  BP: (!) 145/93 138/79  Pulse: (!) 58 69  Resp: 16 16  Temp: (!) 35.8 C     Last Pain:  Vitals:   03/25/16 0902  TempSrc:   PainSc: 3                  Molli Barrows

## 2016-03-31 ENCOUNTER — Encounter: Payer: Self-pay | Admitting: General Surgery

## 2016-03-31 ENCOUNTER — Ambulatory Visit (INDEPENDENT_AMBULATORY_CARE_PROVIDER_SITE_OTHER): Payer: BC Managed Care – PPO | Admitting: General Surgery

## 2016-03-31 VITALS — BP 128/72 | HR 72 | Resp 12 | Ht 73.0 in | Wt 211.0 lb

## 2016-03-31 DIAGNOSIS — K4090 Unilateral inguinal hernia, without obstruction or gangrene, not specified as recurrent: Secondary | ICD-10-CM

## 2016-03-31 NOTE — Patient Instructions (Addendum)
The patient is aware to call back for any questions or concerns.    Proper lifting techniques reviewed. Resume activities as tolerated. 

## 2016-03-31 NOTE — Progress Notes (Signed)
Patient ID: Joshua Lang, male   DOB: 1956-04-12, 60 y.o.   MRN: [id number]  Chief Complaint  Patient presents with  . Routine Post Op    HPI Joshua Lang is a 60 y.o. male.  Here today for postoperative visit, he states he is doing well. Denies any gastrointestinal issues, bowels are moving regular. Taking Aleve as needed for pain. The patient reports he required no narcotics post procedure.  HPI  Past Medical History:  Diagnosis Date  . Allergic rhinitis   . 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 Colonic polyp   . ED (erectile dysfunction)   . Enlarged prostate    Moderately  . GERD (gastroesophageal reflux disease)   . History of kidney stones    H/O  . Hypercholesteremia   . Nephrolithiasis   . Prostate infection    STARTED CIPRO PER UROLOGIST ON 01-28-16 AND IS TO CONTINUE FOR 6 WEEKS    Past Surgical History:  Procedure Laterality Date  . COLONOSCOPY  2015  . HERNIA REPAIR Left 1990's  . INGUINAL HERNIA REPAIR Right 03/24/2016   HERNIA REPAIR WITH LARGE ULTRAPRO MESH;  Surgeon: Robert Bellow, MD;  Location: [facility] ORS;  Service: General;  Laterality: Right;  . JOINT REPLACEMENT Bilateral    THR  . SHOULDER OPEN ROTATOR CUFF REPAIR  2014   [place], partial RTC tear with probably SAD, possible DCE  . SHOULDER SURGERY  2009   open, 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, (Jim Hooten)  . TOTAL HIP ARTHROPLASTY  09/2009   (Hooten)  . TOTAL HIP ARTHROPLASTY Left 05/20/2015   Procedure: TOTAL HIP ARTHROPLASTY;  Surgeon: Dereck Leep, MD;  Location: [facility] ORS;  Service: Orthopedics;  Laterality: Left;    Family History  Problem Relation Age of Onset  . Prostate cancer Father   . Migraines Sister     Social History Social History  Substance Use Topics  . Smoking status: Never Smoker  . Smokeless tobacco: Never Used  . Alcohol use Yes     Comment: occassional beer    Allergies  Allergen Reactions  . Oxycodone Itching    And hyper feeling/mind races  . Tramadol Itching    Current  Outpatient Prescriptions  Medication Sig Dispense Refill  . acetaminophen (TYLENOL) 500 MG tablet Take 500-1,000 mg by mouth every 6 (six) hours as needed (for pain/headache).    . Ascorbic Acid (VITAMIN C WITH ROSE HIPS) 1000 MG tablet Take 1,000 mg by mouth 2 (two) times daily.    Marland Kitchen aspirin 81 MG chewable tablet Chew by mouth daily.    . B Complex-C (SUPER B COMPLEX) TABS Take 1 tablet by mouth 2 (two) times daily.     . Calcium Carbonate-Vitamin D 600-400 MG-UNIT tablet Take 1 tablet by mouth 2 (two) times daily.    . cholecalciferol (VITAMIN D) 1000 UNITS tablet Take 2,000 Units by mouth daily.      . famotidine (PEPCID) 20 MG tablet Take 20 mg by mouth daily as needed for heartburn or indigestion.    . fish oil-omega-3 fatty acids 1000 MG capsule Take 1 g by mouth 2 (two) times daily.     . fluocinonide cream (LIDEX) 0.05 % Apply bid as directed (Patient taking differently: Apply 1 application topically 2 (two) times daily as needed (for irritated skin.). ) 60 g 3  . fluticasone (FLONASE) 50 MCG/ACT nasal spray instill 2 sprays into each nostril once daily (Patient taking differently: Place 1 spray into  both nostrils daily as needed for allergies. ) 16 g 11  . HYDROcodone-acetaminophen (NORCO) 5-325 MG tablet Take 1-2 tablets by mouth every 4 (four) hours as needed for moderate pain. 30 tablet 0  . ketotifen (ZADITOR) 0.025 % ophthalmic solution Place 2 drops into both eyes daily as needed (for dry/irritated eyes).     . Melatonin 1 MG TABS Take 1 mg by mouth at bedtime as needed (for sleep).     . Misc Natural Products (RED WINE EXTRACT PLUS) CAPS Take 1 capsule by mouth 2 (two) times daily.     . Multiple Vitamin (MULTIVITAMIN) tablet Take 1 tablet by mouth 2 (two) times daily.     . polycarbophil (FIBERCON) 625 MG tablet Take 1,875 mg by mouth daily as needed (for digestive regularity.).     Marland Kitchen Polyethyl Glycol-Propyl Glycol (SYSTANE OP) Apply 2 drops to eye 2 (two) times daily as needed  (for dry/scratchy/irritated eyes).     . Probiotic Product (PROBIOTIC DAILY PO) Take 1 capsule by mouth at bedtime.    . Saw Palmetto 450 MG CAPS Take 1 capsule by mouth 2 (two) times daily.     . sildenafil (REVATIO) 20 MG tablet Take 2 to 5 tablets by mouth 30 minutes prior to intercourse. (Patient taking differently: Take 40-100 mg by mouth daily as needed (erectile dysfunction (impotence)). Take 2 to 5 tablets by mouth 30 minutes prior to intercourse.) 50 tablet 11  . simvastatin (ZOCOR) 40 MG tablet Take 1 tablet (40 mg total) by mouth at bedtime. 30 tablet 11   No current facility-administered medications for this visit.     Review of Systems Review of Systems  Constitutional: Negative.   Respiratory: Negative.   Cardiovascular: Negative.     Blood pressure 128/72, pulse 72, resp. rate 12, height 6\' 1"  (1.854 m), weight 211 lb (95.7 kg).  Physical Exam Physical Exam  Constitutional: He is oriented to person, place, and time. He appears well-developed and well-nourished.  Abdominal: Soft. Normal appearance.  Right inguinal repair intact  Neurological: He is alert and oriented to person, place, and time.  Skin: Skin is warm and dry.  Psychiatric: His behavior is normal.      Assessment    Doing well status post inguinal hernia repair.    Plan        Proper lifting techniques reviewed. Resume activities as tolerated. Follow up as needed.  This information has been scribed by Karie Fetch RN, BSN,BC.   Robert Bellow 03/31/2016, 8:55 PM

## 2016-05-12 ENCOUNTER — Telehealth: Payer: Self-pay | Admitting: *Deleted

## 2016-05-12 NOTE — Telephone Encounter (Signed)
Patient called and wanted to ask the nurse a few questions regarding surgery that was done on 03/24/16 (Inguinal hernia repair). I asked him what questions he had and he didn't really want to say. He is out of town today and to call him on his moms home number [phone]

## 2016-05-12 NOTE — Telephone Encounter (Signed)
Patient called back to let us know he will not be at home number, call back on his cell # [phone]

## 2016-05-12 NOTE — Telephone Encounter (Signed)
I talked with the patient. He discussed his gym routine and exercises in detail. Encouraged to gradually resume exercises and gradually increase his activity level in the gym, specifics discussed. Appreciates phone call. .The patient is aware to call back for any questions or concerns.

## 2016-07-01 ENCOUNTER — Encounter: Payer: Self-pay | Admitting: General Surgery

## 2016-07-13 ENCOUNTER — Ambulatory Visit (INDEPENDENT_AMBULATORY_CARE_PROVIDER_SITE_OTHER): Payer: BC Managed Care – PPO | Admitting: Family Medicine

## 2016-07-13 ENCOUNTER — Encounter: Payer: Self-pay | Admitting: Family Medicine

## 2016-07-13 VITALS — BP 121/79 | HR 59 | Temp 97.6°F | Ht 73.0 in | Wt 217.0 lb

## 2016-07-13 DIAGNOSIS — Z125 Encounter for screening for malignant neoplasm of prostate: Secondary | ICD-10-CM

## 2016-07-13 DIAGNOSIS — Z7721 Contact with and (suspected) exposure to potentially hazardous body fluids: Secondary | ICD-10-CM | POA: Diagnosis not present

## 2016-07-13 DIAGNOSIS — Z0000 Encounter for general adult medical examination without abnormal findings: Secondary | ICD-10-CM | POA: Diagnosis not present

## 2016-07-13 DIAGNOSIS — E7800 Pure hypercholesterolemia, unspecified: Secondary | ICD-10-CM

## 2016-07-13 DIAGNOSIS — Z79899 Other long term (current) drug therapy: Secondary | ICD-10-CM

## 2016-07-13 LAB — PSA: PSA: 0.84 ng/mL (ref 0.10–4.00)

## 2016-07-13 LAB — CBC WITH DIFFERENTIAL/PLATELET
BASOS PCT: 0.3 % (ref 0.0–3.0)
Basophils Absolute: 0 10*3/uL (ref 0.0–0.1)
EOS ABS: 0.1 10*3/uL (ref 0.0–0.7)
EOS PCT: 1.2 % (ref 0.0–5.0)
HEMATOCRIT: 46.9 % (ref 39.0–52.0)
HEMOGLOBIN: 15.8 g/dL (ref 13.0–17.0)
LYMPHS PCT: 29.9 % (ref 12.0–46.0)
Lymphs Abs: 1.4 10*3/uL (ref 0.7–4.0)
MCHC: 33.6 g/dL (ref 30.0–36.0)
MCV: 97.3 fl (ref 78.0–100.0)
MONO ABS: 0.4 10*3/uL (ref 0.1–1.0)
Monocytes Relative: 8.9 % (ref 3.0–12.0)
Neutro Abs: 2.8 10*3/uL (ref 1.4–7.7)
Neutrophils Relative %: 59.7 % (ref 43.0–77.0)
Platelets: 176 10*3/uL (ref 150.0–400.0)
RBC: 4.83 Mil/uL (ref 4.22–5.81)
RDW: 13.8 % (ref 11.5–15.5)
WBC: 4.6 10*3/uL (ref 4.0–10.5)

## 2016-07-13 LAB — BASIC METABOLIC PANEL
BUN: 13 mg/dL (ref 6–23)
CHLORIDE: 104 meq/L (ref 96–112)
CO2: 29 mEq/L (ref 19–32)
CREATININE: 0.84 mg/dL (ref 0.40–1.50)
Calcium: 9.5 mg/dL (ref 8.4–10.5)
GFR: 99.15 mL/min (ref >60.00)
Glucose, Bld: 90 mg/dL (ref 70–99)
POTASSIUM: 4.6 meq/L (ref 3.5–5.1)
Sodium: 141 mEq/L (ref 135–145)

## 2016-07-13 LAB — LIPID PANEL
CHOL/HDL RATIO: 3
Cholesterol: 174 mg/dL (ref 0–200)
HDL: 52 mg/dL (ref >39.00)
LDL CALC: 109 mg/dL — AB (ref 0–99)
NONHDL: 121.55
Triglycerides: 65 mg/dL (ref 0.0–149.0)
VLDL: 13 mg/dL (ref 0.0–40.0)

## 2016-07-13 LAB — HEPATIC FUNCTION PANEL
ALT: 22 U/L (ref 0–53)
AST: 20 U/L (ref 0–37)
Albumin: 4.5 g/dL (ref 3.5–5.2)
Alkaline Phosphatase: 60 U/L (ref 39–117)
BILIRUBIN DIRECT: 0.1 mg/dL (ref 0.0–0.3)
BILIRUBIN TOTAL: 0.5 mg/dL (ref 0.2–1.2)
TOTAL PROTEIN: 7.3 g/dL (ref 6.0–8.3)

## 2016-07-13 NOTE — Progress Notes (Signed)
Dr. Frederico Hamman T. Irelynd Zumstein, MD, [facility] [place], [address] Phone: [phone] Fax: [phone]  07/13/2016  Patient: Joshua Lang, MRN: [id number], DOB: 1956/01/22, 61 y.o.  Primary Physician:  Owens Loffler, MD   Chief Complaint  Patient presents with  . Annual Exam   Subjective:   Joshua Lang is a 61 y.o. pleasant patient who presents with the following:  Preventative Health Maintenance Visit:  Health Maintenance Summary Reviewed and updated, unless pt declines services.  Tobacco History Reviewed. Alcohol: No concerns, no excessive use Exercise Habits: Some activity, rec at least 30 mins 5 times a week STD concerns: single - no worries, but wants to get checked Drug Use: None Encouraged self-testicular check  Worried about PSA - brother in law.   Std tests.  Exercise questions.   Health Maintenance  Topic Date Due  . COLONOSCOPY  12/09/2017  . TETANUS/TDAP  12/31/2025  . INFLUENZA VACCINE  Addressed  . Hepatitis C Screening  Completed  . HIV Screening  Completed   Immunization History  Administered Date(s) Administered  . Influenza Whole 02/08/2009, 02/07/2012  . Influenza, Seasonal, Injecte, Preservative Fre 01/08/2014  . Influenza,inj,Quad PF,36+ Mos 01/09/2015, 01/01/2016  . Pneumococcal Conjugate-13 04/23/2015  . Pneumococcal Polysaccharide-23 03/28/2016  . Td 02/13/2009  . Tdap 01/01/2016  . Zoster 06/22/2013   Patient Active Problem List   Diagnosis Date Noted  . Hernia of abdominal cavity 01/23/2016  . Degenerative joint disease (DJD) of hip 05/20/2015  . S/P total hip arthroplasty 05/20/2015  . FATIGUE 03/12/2010  . ALLERGIC RHINITIS 02/17/2009  . 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 UNEQUAL LEG LENGTH 02/13/2009  . Sleep apnea 02/13/2009  . COLONIC POLYPS 10/30/2008  . HYPERCHOLESTEROLEMIA 10/30/2008  . NEPHROLITHIASIS 10/30/2008  . DEGENERATIVE JOINT DISEASE, HIPS  10/30/2008   Past Medical History:  Diagnosis Date  . Allergic rhinitis   . 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 Colonic polyp   . ED (erectile dysfunction)   . Enlarged prostate    Moderately  . GERD (gastroesophageal reflux disease)   . History of kidney stones    H/O  . Hypercholesteremia   . Nephrolithiasis   . Prostate infection    STARTED CIPRO PER UROLOGIST ON 01-28-16 AND IS TO CONTINUE FOR 6 WEEKS   Past Surgical History:  Procedure Laterality Date  . COLONOSCOPY  2015  . HERNIA REPAIR Left 1990's  . INGUINAL HERNIA REPAIR Right 03/24/2016   Procedure: HERNIA REPAIR INGUINAL ADULT;  Surgeon: Robert Bellow, MD;  Location: [facility] ORS;  Service: General;  Laterality: Right;  . JOINT REPLACEMENT Bilateral    THR  . SHOULDER OPEN ROTATOR CUFF REPAIR  2014   [facility], partial RTC tear with probably SAD, possible DCE  . SHOULDER SURGERY  2009   open, 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, (Jim Hooten)  . TOTAL HIP ARTHROPLASTY  09/2009   ([facility])  . TOTAL HIP ARTHROPLASTY Left 05/20/2015   Procedure: TOTAL HIP ARTHROPLASTY;  Surgeon: Dereck Leep, MD;  Location: [facility] ORS;  Service: Orthopedics;  Laterality: Left;   Social History   Social History  . Marital status: Single    Spouse name: N/A  . Number of children: N/A  . Years of education: N/A   Occupational History  . [employer], [employer], head     Doctorate   Social History Main Topics  . Smoking status: Never Smoker  . Smokeless tobacco: Never  Used  . Alcohol use Yes     Comment: occassional beer  . Drug use: No  . Sexual activity: Not on file   Other Topics Concern  . Not on file   Social History Narrative  . No narrative on file   Family History  Problem Relation Age of Onset  . Prostate cancer Father   . Migraines Sister    Allergies  Allergen Reactions  . Oxycodone Itching    And hyper feeling/mind races  . Tramadol Itching    Medication list has been reviewed and updated.   General: Denies fever,  chills, sweats. No significant weight loss. Eyes: Denies blurring,significant itching ENT: Denies earache, sore throat, and hoarseness. Cardiovascular: Denies chest pains, palpitations, dyspnea on exertion Respiratory: Denies cough, dyspnea at rest,wheeezing Breast: no concerns about lumps GI: Denies nausea, vomiting, diarrhea, constipation, change in bowel habits, abdominal pain, melena, hematochezia GU: Denies penile discharge, ED, urinary flow / outflow problems. No STD concerns. Musculoskeletal: Denies back pain, joint pain Derm: Denies rash, itching Neuro: Denies  paresthesias, frequent falls, frequent headaches Psych: Denies depression, anxiety Endocrine: Denies cold intolerance, heat intolerance, polydipsia Heme: Denies enlarged lymph nodes Allergy: No hayfever  Objective:   BP 121/79   Pulse (!) 59   Temp 97.6 F (36.4 C) (Oral)   Ht 6' 1"  (1.854 m)   Wt 217 lb (98.4 kg)   BMI 28.63 kg/m  Ideal Body Weight: Weight in (lb) to have BMI = 25: 189.1  No exam data present  GEN: well developed, well nourished, no acute distress Eyes: conjunctiva and lids normal, PERRLA, EOMI ENT: TM clear, nares clear, oral exam WNL Neck: supple, no lymphadenopathy, no thyromegaly, no JVD Pulm: clear to auscultation and percussion, respiratory effort normal CV: regular rate and rhythm, S1-S2, no murmur, rub or gallop, no bruits, peripheral pulses normal and symmetric, no cyanosis, clubbing, edema or varicosities GI: soft, non-tender; no hepatosplenomegaly, masses; active bowel sounds all quadrants GU: no hernia, testicular mass, penile discharge Lymph: no cervical, axillary or inguinal adenopathy MSK: gait normal, muscle tone and strength WNL, no joint swelling, effusions, discoloration, crepitus  SKIN: clear, good turgor, color WNL, no rashes, lesions, or ulcerations Neuro: normal mental status, normal strength, sensation, and motion Psych: alert; oriented to person, place and time,  normally interactive and not anxious or depressed in appearance. All labs reviewed with patient.  Lipids:    Component Value Date/Time   CHOL 174 07/13/2016 1520   TRIG 65.0 07/13/2016 1520   HDL 52.00 07/13/2016 1520   VLDL 13.0 07/13/2016 1520   CHOLHDL 3 07/13/2016 1520   CBC: CBC Latest Ref Rng & Units 07/13/2016 07/10/2015 05/22/2015  WBC 4.0 - 10.5 K/uL 4.6 4.6 8.6  Hemoglobin 13.0 - 17.0 g/dL 15.8 14.9 12.3(L)  Hematocrit 39.0 - 52.0 % 46.9 44.6 35.9(L)  Platelets 150.0 - 400.0 K/uL 176.0 219.0 129(L)    Basic Metabolic Panel:    Component Value Date/Time   NA 141 07/13/2016 1520   K 4.6 07/13/2016 1520   CL 104 07/13/2016 1520   CO2 29 07/13/2016 1520   BUN 13 07/13/2016 1520   CREATININE 0.84 07/13/2016 1520   GLUCOSE 90 07/13/2016 1520   CALCIUM 9.5 07/13/2016 1520   Hepatic Function Latest Ref Rng & Units 07/13/2016 07/10/2015 07/04/2014  Total Protein 6.0 - 8.3 g/dL 7.3 7.5 7.6  Albumin 3.5 - 5.2 g/dL 4.5 4.7 4.7  AST 0 - 37 U/L 20 20 25   ALT 0 - 53 U/L  22 19 24   Alk Phosphatase 39 - 117 U/L 60 78 66  Total Bilirubin 0.2 - 1.2 mg/dL 0.5 0.6 0.6  Bilirubin, Direct 0.0 - 0.3 mg/dL 0.1 0.1 0.1    Lab Results  Component Value Date   TSH 1.23 03/12/2010   Lab Results  Component Value Date   PSA 0.84 07/13/2016   PSA 1.48 07/10/2015   PSA 0.79 07/04/2014    Assessment and Plan:   Healthcare maintenance  HYPERCHOLESTEROLEMIA - Plan: Lipid panel  Encounter for long-term (current) use of medications - Plan: Basic metabolic panel, CBC with Differential/Platelet, Hepatic function panel  Screening PSA (prostate specific antigen) - Plan: PSA  Exposure to blood or body fluid - Plan: RPR, HIV antibody, GC/Chlamydia Probe Amp, CANCELED: GC/Chlamydia Probe Amp  Health Maintenance Exam: The patient's preventative maintenance and recommended screening tests for an annual wellness exam were reviewed in full today. Brought up to date unless services  declined.  Counselled on the importance of diet, exercise, and its role in overall health and mortality. The patient's FH and SH was reviewed, including their home life, tobacco status, and drug and alcohol status.  Follow-up in 1 year for physical exam or additional follow-up below.  Follow-up: No Follow-up on file. Or follow-up in 1 year if not noted.  Meds ordered this encounter  Medications  . Melatonin 3 MG TABS    Sig: Take 1 tablet by mouth at bedtime as needed.  Marland Kitchen RESVERATROL PO    Sig: Take 500 mg by mouth daily.  Marland Kitchen DISCONTD: Omega-3 Fatty Acids (FISH OIL) 1200 MG CAPS    Sig: Take 1 capsule by mouth daily.  . Omega-3 Fatty Acids (FISH OIL OMEGA-3 PO)    Sig: Take by mouth. 1200-360 mg.  Take one capsule by mouth daily   Medications Discontinued During This Encounter  Medication Reason  . acetaminophen (TYLENOL) 500 MG tablet Error  . Misc Natural Products (RED WINE EXTRACT PLUS) CAPS Change in therapy  . Melatonin 1 MG TABS Change in therapy  . fish oil-omega-3 fatty acids 1000 MG capsule Change in therapy  . Omega-3 Fatty Acids (FISH OIL) 1200 MG CAPS Change in therapy   Orders Placed This Encounter  Procedures  . GC/Chlamydia Probe Amp  . Basic metabolic panel  . CBC with Differential/Platelet  . Hepatic function panel  . Lipid panel  . PSA  . RPR  . HIV antibody    Signed,  Frederico Hamman T. Micalah Cabezas, MD   Allergies as of 07/13/2016      Reactions   Oxycodone Itching   And hyper feeling/mind races   Tramadol Itching      Medication List       Accurate as of 07/13/16 11:59 PM. Always use your most recent med list.          aspirin 81 MG chewable tablet Chew by mouth daily.   Calcium Carbonate-Vitamin D 600-400 MG-UNIT tablet Take 1 tablet by mouth 2 (two) times daily.   cholecalciferol 1000 units tablet Commonly known as:  VITAMIN D Take 2,000 Units by mouth daily.   famotidine 20 MG tablet Commonly known as:  PEPCID Take 20 mg by mouth daily  as needed for heartburn or indigestion.   FISH OIL OMEGA-3 PO Take by mouth. 1200-360 mg.  Take one capsule by mouth daily   fluocinonide cream 0.05 % Commonly known as:  LIDEX Apply bid as directed   fluticasone 50 MCG/ACT nasal spray Commonly known as:  FLONASE instill  2 sprays into each nostril once daily   HYDROcodone-acetaminophen 5-325 MG tablet Commonly known as:  NORCO Take 1-2 tablets by mouth every 4 (four) hours as needed for moderate pain.   ketotifen 0.025 % ophthalmic solution Commonly known as:  ZADITOR Place 2 drops into both eyes daily as needed (for dry/irritated eyes).   Melatonin 3 MG Tabs Take 1 tablet by mouth at bedtime as needed.   multivitamin tablet Take 1 tablet by mouth 2 (two) times daily.   polycarbophil 625 MG tablet Commonly known as:  FIBERCON Take 1,875 mg by mouth daily as needed (for digestive regularity.).   PROBIOTIC DAILY PO Take 1 capsule by mouth at bedtime.   RESVERATROL PO Take 500 mg by mouth daily.   Saw Palmetto 450 MG Caps Take 1 capsule by mouth 2 (two) times daily.   sildenafil 20 MG tablet Commonly known as:  REVATIO Take 2 to 5 tablets by mouth 30 minutes prior to intercourse.   simvastatin 40 MG tablet Commonly known as:  ZOCOR Take 1 tablet (40 mg total) by mouth at bedtime.   SUPER B COMPLEX Tabs Take 1 tablet by mouth 2 (two) times daily.   SYSTANE OP Apply 2 drops to eye 2 (two) times daily as needed (for dry/scratchy/irritated eyes).   vitamin C with rose hips 1000 MG tablet Take 1,000 mg by mouth 2 (two) times daily.

## 2016-07-13 NOTE — Progress Notes (Signed)
Pre visit review using our clinic review tool, if applicable. No additional management support is needed unless otherwise documented below in the visit note. 

## 2016-07-14 LAB — GC/CHLAMYDIA PROBE AMP
CT PROBE, AMP APTIMA: NOT DETECTED
GC PROBE AMP APTIMA: NOT DETECTED

## 2016-07-14 LAB — RPR

## 2016-07-14 LAB — HIV ANTIBODY (ROUTINE TESTING W REFLEX): HIV: NONREACTIVE

## 2016-07-15 ENCOUNTER — Encounter: Payer: Self-pay | Admitting: *Deleted

## 2016-07-27 ENCOUNTER — Other Ambulatory Visit: Payer: Self-pay | Admitting: Family Medicine

## 2016-08-20 ENCOUNTER — Telehealth: Payer: Self-pay | Admitting: Family Medicine

## 2016-08-20 NOTE — Telephone Encounter (Signed)
Pt received a call and letter within the last week from [facility]. in [place] concerning a referral from Dr Lorelei Pont. He does not understand why he needs to go for a sleep study. He is not interested in going at this time and if he has any more issues he prefers to address them with Dr. Lorelei Pont.

## 2016-08-20 NOTE — Telephone Encounter (Signed)
I believe this was do to preop eval. Noted.

## 2016-10-20 ENCOUNTER — Telehealth: Payer: Self-pay

## 2016-10-20 MED ORDER — TERBINAFINE HCL 250 MG PO TABS: 250.0000 mg | ORAL_TABLET | Freq: Every day | ORAL | 2 refills | Status: DC

## 2016-10-20 NOTE — Telephone Encounter (Signed)
done

## 2016-10-20 NOTE — Telephone Encounter (Signed)
Pt left /vm requesting a referral to a specialist that will get rid of toenail fungus; last annual 07/13/16.

## 2016-10-20 NOTE — Telephone Encounter (Signed)
Spoke with Joshua Lang.  He would be very appreciative if you would send in Rx for Lamisil to his pharmacy.

## 2016-10-20 NOTE — Telephone Encounter (Signed)
Left message for Mr. Herder to return my call.

## 2016-10-20 NOTE — Telephone Encounter (Signed)
I don't completely understand. There really isn't a toenail fungus specialist unless he would like his toenails surgically removed. It almost never comes to that.   I am happy to send him in some oral Lamisil if he would like.

## 2016-10-26 MED ORDER — TERBINAFINE HCL 250 MG PO TABS: 250.0000 mg | ORAL_TABLET | Freq: Every day | ORAL | 2 refills | Status: DC

## 2016-10-26 NOTE — Telephone Encounter (Signed)
Looks like Lamisil Rx was sent to [facility].  [place] and had them cancel Rx.  Re-sent Lamisil Rx to [facility]  Left message for [name] advising him that this has been done.

## 2016-10-26 NOTE — Addendum Note (Signed)
Addended by: Carter Kitten on: 10/26/2016 04:53 PM   Modules accepted: Orders

## 2016-10-26 NOTE — Telephone Encounter (Signed)
Pt left v/m requesting status of refill of lamisil to [facility]. Pt request cb.

## 2016-12-03 NOTE — Telephone Encounter (Signed)
Okay to stop terbinafine as really a cosmetic issue. If continuing to feel bad.. Make appt for eval and liver as well as other tests can be checked

## 2016-12-03 NOTE — Telephone Encounter (Signed)
Pt left v/m; pt has been taking terbinafine for one month; pt spoke with pharmacist and was told that terbinafine could affect the liver; pt is not feeling well, having nausea, no energy and a general discomfort. Pt is also taking simvastatin and pt understands simvastatin can be rough on the liver also. Pt cannot tell if toe nail is better because toenail has got to grow out again. Pt has decided to stop taking the terbinafine and request cb to see what the PCP thinks.Please advise.

## 2016-12-04 NOTE — Telephone Encounter (Signed)
Mr. Frieden notified as instructed by telephone.

## 2016-12-07 ENCOUNTER — Telehealth: Payer: Self-pay | Admitting: *Deleted

## 2016-12-07 NOTE — Telephone Encounter (Signed)
Patient left a voicemail stating that he wants you to note on his chart that the generic lamisil caused him not to be able to sleep, depression and no energy, Patient stated that he stopped taking this about 30 days ago. Patient stated that he is not sure what this medication and the Simvastatin may have done to his liver. Patient stated that he stoke to a pharmacist and was told that there is something else that he can take for his nail fungus and wants to know what other treatment Dr. Lorelei Pont recommends.

## 2016-12-08 MED ORDER — CICLOPIROX 8 % EX SOLN: Freq: Every day | CUTANEOUS | 5 refills | Status: DC

## 2016-12-08 NOTE — Telephone Encounter (Signed)
Can you call in the topical penlac as I have entered it? Script is too long to e-scribe and let him know.

## 2016-12-09 MED ORDER — CICLOPIROX 8 % EX SOLN: Freq: Every day | CUTANEOUS | 5 refills | Status: DC

## 2016-12-09 NOTE — Telephone Encounter (Signed)
Patient returned your call.

## 2016-12-09 NOTE — Telephone Encounter (Signed)
Mr. Bivins notified that I have added terbinafine to his allergy list as an intolerance.  He would like to try to Penlac solution.  Rx faxed to [facility] on [address].

## 2016-12-09 NOTE — Telephone Encounter (Signed)
Left message for Mr. Dougal to return my call.

## 2016-12-09 NOTE — Addendum Note (Signed)
Addended by: Carter Kitten on: 12/09/2016 10:08 AM   Modules accepted: Orders

## 2016-12-14 ENCOUNTER — Telehealth: Payer: Self-pay | Admitting: *Deleted

## 2016-12-14 NOTE — Telephone Encounter (Signed)
Received fax from [facility] requesting PA for Penlac Solution.  PA completed on CoverMyMeds.  Awaiting decision.

## 2016-12-14 NOTE — Telephone Encounter (Signed)
PA denied.  [facility] notified of denial via fax.  Advised patient could pay out of pocket or buy OTC.

## 2016-12-16 ENCOUNTER — Telehealth: Payer: Self-pay | Admitting: *Deleted

## 2016-12-16 NOTE — Telephone Encounter (Signed)
He asked to stop oral lamisil. I would use the topical penlac. This used to be 100's of dollars - thankfully now generic.  Difficult for me to know about ear. Is it exterior? Pain now, pain this week? Trauma?

## 2016-12-16 NOTE — Telephone Encounter (Signed)
Spoke with Mr. Olivero.  I advised getting the Penlac solution for $38 is probably his best bet since he does not wish to take the Lamisil.  I also advised he could use something OTC as well, which might be cheaper.  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 The pharmacist told him the Rx is good for a year. As far as his ear.  He denies any trauma or pain.  States he just woke up and felt like the inside of his ear was wet so he used a Q-tip and there was a little blood on the Q-tip.  I advised I would just keep a watch on his ear since he is not having any pain and there was no trauma.  Advised if he finds that this is continuing to happen, then we should probably take a look at his ear.  Patient states understanding and is in agreement with plan.

## 2016-12-16 NOTE — Telephone Encounter (Signed)
Pt left vm at triage stating he went to pick up his fungus medication and it is $38. He is wanting to know if there is something else that can be prescribed for a more economic or if this med is considered the best for his Dx, he is willing to pay out of pocket. Also, pt states that he awakened this am with a small amount of blood in his ear, but it has since subsided, and is wanting to know if this is something he should be seen for. pls advise

## 2017-03-08 ENCOUNTER — Other Ambulatory Visit: Payer: Self-pay | Admitting: *Deleted

## 2017-03-08 ENCOUNTER — Other Ambulatory Visit: Payer: BC Managed Care – PPO

## 2017-03-08 DIAGNOSIS — N400 Enlarged prostate without lower urinary tract symptoms: Secondary | ICD-10-CM

## 2017-03-09 LAB — PSA: Prostate Specific Ag, Serum: 0.9 ng/mL (ref 0.0–4.0)

## 2017-03-10 ENCOUNTER — Encounter: Payer: Self-pay | Admitting: Urology

## 2017-03-10 ENCOUNTER — Ambulatory Visit: Payer: BC Managed Care – PPO | Admitting: Urology

## 2017-03-10 VITALS — BP 139/82 | HR 66 | Ht 73.0 in | Wt 219.2 lb

## 2017-03-10 DIAGNOSIS — N411 Chronic prostatitis: Secondary | ICD-10-CM | POA: Diagnosis not present

## 2017-03-10 DIAGNOSIS — N400 Enlarged prostate without lower urinary tract symptoms: Secondary | ICD-10-CM | POA: Insufficient documentation

## 2017-03-10 NOTE — Progress Notes (Signed)
03/10/2017 3:43 PM   Joshua Lang 1956-03-10 [id number]  Referring provider: Owens Loffler, MD [place], [place] [address]  Chief Complaint  Patient presents with  . Prostatitis    HPI: 61 y.o. male presents today for annual follow-up.  He has a history of chronic prostatitis however has been doing quite well for the past 2 years.  He is taking saw palmetto.  He has mild urinary frequency and nocturia x1-2.  The symptoms are not bothersome.  Denies dysuria or gross hematuria.  Denies flank, abdominal, pelvic or scrotal pain.  A PSA drawn on 03/08/2017 was stable at 0.9.   PMH: Past Medical History:  Diagnosis Date  . Allergic rhinitis   . 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 Colonic polyp   . ED (erectile dysfunction)   . Enlarged prostate    Moderately  . GERD (gastroesophageal reflux disease)   . History of kidney stones    H/O  . Hypercholesteremia   . Nephrolithiasis   . Prostate infection    STARTED CIPRO PER UROLOGIST ON 01-28-16 AND IS TO CONTINUE FOR 6 WEEKS    Surgical History: Past Surgical History:  Procedure Laterality Date  . COLONOSCOPY  2015  . HERNIA REPAIR Left 1990's  . INGUINAL HERNIA REPAIR Right 03/24/2016   Procedure: HERNIA REPAIR INGUINAL ADULT;  Surgeon: Robert Bellow, MD;  Location: [facility] ORS;  Service: General;  Laterality: Right;  . JOINT REPLACEMENT Bilateral    THR  . SHOULDER OPEN ROTATOR CUFF REPAIR  2014   [facility], partial RTC tear with probably SAD, possible DCE  . SHOULDER SURGERY  2009   open, 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, (Jim Hooten)  . TOTAL HIP ARTHROPLASTY  09/2009   ([facility])  . TOTAL HIP ARTHROPLASTY Left 05/20/2015   Procedure: TOTAL HIP ARTHROPLASTY;  Surgeon: Dereck Leep, MD;  Location: [facility] ORS;  Service: Orthopedics;  Laterality: Left;    Home Medications:  Allergies as of 03/10/2017      Reactions   Oxycodone Itching   And hyper feeling/mind races   Terbinafine And Related    Trouble sleeping,  depression & fatigue   Tramadol Itching   Antihistamines, Chlorpheniramine-type Other (See Comments)   Urinary frequency/urgency.      Medication List       Accurate as of 03/10/17  3:43 PM. Always use your most recent med list.          aspirin 81 MG chewable tablet Chew by mouth daily.   Calcium Carbonate-Vitamin D 600-400 MG-UNIT tablet Take 1 tablet by mouth 2 (two) times daily.   cholecalciferol 1000 units tablet Commonly known as:  VITAMIN D Take 2,000 Units by mouth daily.   famotidine 20 MG tablet Commonly known as:  PEPCID Take 20 mg by mouth daily as needed for heartburn or indigestion.   FISH OIL OMEGA-3 PO Take by mouth 2 (two) times daily. 1200-360 mg.  Take one capsule by mouth daily   fluocinonide cream 0.05 % Commonly known as:  LIDEX Apply bid as directed   fluticasone 50 MCG/ACT nasal spray Commonly known as:  FLONASE instill 2 sprays into each nostril once daily   ketotifen 0.025 % ophthalmic solution Commonly known as:  ZADITOR Place 2 drops into both eyes daily as needed (for dry/irritated eyes).   Melatonin 3 MG Tabs Take 1 tablet by mouth at bedtime as needed.   multivitamin tablet Take 1 tablet by mouth 2 (two)  times daily.   polycarbophil 625 MG tablet Commonly known as:  FIBERCON Take 1,875 mg by mouth daily as needed (for digestive regularity.).   PROBIOTIC DAILY PO Take 1 capsule by mouth at bedtime.   RESVERATROL PO Take 500 mg by mouth daily.   Saw Palmetto 450 MG Caps Take 1 capsule by mouth 2 (two) times daily.   sildenafil 20 MG tablet Commonly known as:  REVATIO Take 2 to 5 tablets by mouth 30 minutes prior to intercourse.   simvastatin 40 MG tablet Commonly known as:  ZOCOR take 1 tablet by mouth at bedtime   SUPER B COMPLEX Tabs Take 1 tablet by mouth 2 (two) times daily.   SYSTANE OP Apply 2 drops to eye 2 (two) times daily as needed (for dry/scratchy/irritated eyes).   vitamin C with rose hips 1000 MG  tablet Take 1,000 mg by mouth 2 (two) times daily.       Allergies:  Allergies  Allergen Reactions  . Oxycodone Itching    And hyper feeling/mind races  . Terbinafine And Related     Trouble sleeping, depression & fatigue  . Tramadol Itching  . Antihistamines, Chlorpheniramine-Type Other (See Comments)    Urinary frequency/urgency.    Family History: Family History  Problem Relation Age of Onset  . Prostate cancer Father   . Migraines Sister     Social History:  reports that he has never smoked. He has never used smokeless tobacco. He reports that he drinks alcohol. He reports that he does not use drugs.  ROS: UROLOGY Frequent Urination?: Yes Hard to postpone urination?: No Burning/pain with urination?: No Get up at night to urinate?: Yes Leakage of urine?: No Urine stream starts and stops?: No Trouble starting stream?: No Do you have to strain to urinate?: No Blood in urine?: No Urinary tract infection?: No Sexually transmitted disease?: No Injury to kidneys or bladder?: No Painful intercourse?: No Weak stream?: No Erection problems?: No Penile pain?: No  Gastrointestinal Nausea?: No Vomiting?: No Indigestion/heartburn?: No Diarrhea?: No Constipation?: No  Constitutional Fever: No Night sweats?: No Weight loss?: No Fatigue?: No  Skin Skin rash/lesions?: No Itching?: No  Eyes Blurred vision?: No Double vision?: No  Ears/Nose/Throat Sore throat?: No Sinus problems?: No  Hematologic/Lymphatic Swollen glands?: No Easy bruising?: No  Cardiovascular Leg swelling?: No Chest pain?: No  Respiratory Cough?: No Shortness of breath?: No  Endocrine Excessive thirst?: No  Musculoskeletal Back pain?: No Joint pain?: No  Neurological Headaches?: No Dizziness?: No  Psychologic Depression?: No Anxiety?: No  Physical Exam: BP 139/82 (BP Location: Right Arm, Patient Position: Sitting, Cuff Size: Normal)   Pulse 66   Ht 6\' 1"  (1.854 m)    Wt 219 lb 3.2 oz (99.4 kg)   BMI 28.92 kg/m   Constitutional:  Alert and oriented, No acute distress. HEENT: [place] AT, moist mucus membranes.  Trachea midline, no masses. Cardiovascular: No clubbing, cyanosis, or edema. Respiratory: Normal respiratory effort, no increased work of breathing. GI: Abdomen is soft, nontender, nondistended, no abdominal masses GU: No CVA tenderness.  Prostate 40 g, smooth without nodules Skin: No rashes, bruises or suspicious lesions. Lymph: No cervical or inguinal adenopathy. Neurologic: Grossly intact, no focal deficits, moving all 4 extremities. Psychiatric: Normal mood and affect.  Laboratory Data: Lab Results  Component Value Date   WBC 4.6 07/13/2016   HGB 15.8 07/13/2016   HCT 46.9 07/13/2016   MCV 97.3 07/13/2016   PLT 176.0 07/13/2016    Lab Results  Component Value Date   CREATININE 0.84 07/13/2016    Lab Results  Component Value Date   PSA1 0.9 03/08/2017    Urinalysis Dipstick/microscopy negative   Assessment & Plan:   Presently asymptomatic.  He desires to continue annual follow-up.  1. Benign prostatic hyperplasia without lower urinary tract symptoms   2. Chronic prostatitis    Return in about 1 year (around 03/10/2018) for Recheck.  Abbie Sons, [place] [address], [place], [place] [address] [phone]

## 2017-03-16 ENCOUNTER — Telehealth: Payer: Self-pay

## 2017-03-16 NOTE — Telephone Encounter (Signed)
LMOM

## 2017-03-16 NOTE — Telephone Encounter (Signed)
-----   Message from Abbie Sons, MD sent at 03/16/2017  1:12 PM EST ----- PSA stable at 0.9

## 2017-03-19 NOTE — Telephone Encounter (Signed)
LMOM- labs are stable.

## 2017-04-17 ENCOUNTER — Other Ambulatory Visit: Payer: Self-pay | Admitting: Family Medicine

## 2017-06-22 ENCOUNTER — Ambulatory Visit: Payer: BC Managed Care – PPO | Admitting: Urology

## 2017-06-22 ENCOUNTER — Encounter: Payer: Self-pay | Admitting: Urology

## 2017-06-22 VITALS — BP 125/80 | HR 83 | Temp 98.1°F | Ht 73.0 in | Wt 220.0 lb

## 2017-06-22 DIAGNOSIS — N410 Acute prostatitis: Secondary | ICD-10-CM | POA: Diagnosis not present

## 2017-06-22 LAB — MICROSCOPIC EXAMINATION: Epithelial Cells (non renal): NONE SEEN /hpf (ref 0–10)

## 2017-06-22 LAB — URINALYSIS, COMPLETE
BILIRUBIN UA: NEGATIVE
Glucose, UA: NEGATIVE
Nitrite, UA: POSITIVE — AB
PROTEIN UA: NEGATIVE
Urobilinogen, Ur: 0.2 mg/dL (ref 0.2–1.0)
pH, UA: 6 (ref 5.0–7.5)

## 2017-06-22 MED ORDER — CIPROFLOXACIN HCL 500 MG PO TABS: 500.0000 mg | ORAL_TABLET | Freq: Two times a day (BID) | ORAL | 0 refills | Status: AC

## 2017-06-22 MED ORDER — TAMSULOSIN HCL 0.4 MG PO CAPS: 0.4000 mg | ORAL_CAPSULE | Freq: Every day | ORAL | 0 refills | Status: DC

## 2017-06-22 NOTE — Progress Notes (Signed)
06/22/2017 1:48 PM   Joshua Lang 1955-09-05 [id number]  Referring provider: Owens Loffler, MD [place], [place] [address]  Chief Complaint  Patient presents with  . Prostatitis    HPI: 62 year old male followed for chronic prostatitis.  I last saw him in October 2018 and he was doing well.  He states over the weekend he developed increased urinary frequency, urgency and chills.  Last night he had onset of malaise, nausea and dysuria.  There are no identifiable precipitating, aggravating or alleviating factors to his symptoms.   PMH: Past Medical History:  Diagnosis Date  . Allergic rhinitis   . 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 Colonic polyp   . ED (erectile dysfunction)   . Enlarged prostate    Moderately  . GERD (gastroesophageal reflux disease)   . History of kidney stones    H/O  . Hypercholesteremia   . Nephrolithiasis   . Prostate infection    STARTED CIPRO PER UROLOGIST ON 01-28-16 AND IS TO CONTINUE FOR 6 WEEKS    Surgical History: Past Surgical History:  Procedure Laterality Date  . COLONOSCOPY  2015  . HERNIA REPAIR Left 1990's  . INGUINAL HERNIA REPAIR Right 03/24/2016   Procedure: HERNIA REPAIR INGUINAL ADULT;  Surgeon: Robert Bellow, MD;  Location: [facility] ORS;  Service: General;  Laterality: Right;  . JOINT REPLACEMENT Bilateral    THR  . SHOULDER OPEN ROTATOR CUFF REPAIR  2014   Hooten, partial RTC tear with probably SAD, possible DCE  . SHOULDER SURGERY  2009   open, 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, (Jim Hooten)  . TOTAL HIP ARTHROPLASTY  09/2009   (Hooten)  . TOTAL HIP ARTHROPLASTY Left 05/20/2015   Procedure: TOTAL HIP ARTHROPLASTY;  Surgeon: Dereck Leep, MD;  Location: [facility] ORS;  Service: Orthopedics;  Laterality: Left;    Home Medications:  Allergies as of 06/22/2017      Reactions   Oxycodone Itching   And hyper feeling/mind races   Terbinafine And Related    Trouble sleeping, depression & fatigue   Tramadol Itching   Antihistamines, Chlorpheniramine-type Other (See Comments)   Urinary frequency/urgency.      Medication List        Accurate as of 06/22/17  1:48 PM. Always use your most recent med list.          aspirin 81 MG chewable tablet Chew by mouth daily.   Calcium Carbonate-Vitamin D 600-400 MG-UNIT tablet Take 1 tablet by mouth 2 (two) times daily.   cholecalciferol 1000 units tablet Commonly known as:  VITAMIN D Take 2,000 Units by mouth daily.   famotidine 20 MG tablet Commonly known as:  PEPCID Take 20 mg by mouth daily as needed for heartburn or indigestion.   FISH OIL OMEGA-3 PO Take by mouth 2 (two) times daily. 1200-360 mg.  Take one capsule by mouth daily   fluocinonide cream 0.05 % Commonly known as:  LIDEX Apply bid as directed   fluticasone 50 MCG/ACT nasal spray Commonly known as:  FLONASE instill 2 sprays into each nostril once daily   ketotifen 0.025 % ophthalmic solution Commonly known as:  ZADITOR Place 2 drops into both eyes daily as needed (for dry/irritated eyes).   Melatonin 3 MG Tabs Take 1 tablet by mouth at bedtime as needed.   multivitamin tablet Take 1 tablet by mouth 2 (two) times daily.   polycarbophil 625 MG tablet Commonly known as:  FIBERCON Take 1,875  mg by mouth daily as needed (for digestive regularity.).   PROBIOTIC DAILY PO Take 1 capsule by mouth at bedtime.   RESVERATROL PO Take 500 mg by mouth daily.   Saw Palmetto 450 MG Caps Take 1 capsule by mouth 2 (two) times daily.   sildenafil 20 MG tablet Commonly known as:  REVATIO Take 2 to 5 tablets by mouth 30 minutes prior to intercourse.   simvastatin 40 MG tablet Commonly known as:  ZOCOR take 1 tablet by mouth at bedtime   SUPER B COMPLEX Tabs Take 1 tablet by mouth 2 (two) times daily.   SYSTANE OP Apply 2 drops to eye 2 (two) times daily as needed (for dry/scratchy/irritated eyes).   vitamin C with rose hips 1000 MG tablet Take 1,000 mg by mouth 2 (two) times  daily.       Allergies:  Allergies  Allergen Reactions  . Oxycodone Itching    And hyper feeling/mind races  . Terbinafine And Related     Trouble sleeping, depression & fatigue  . Tramadol Itching  . Antihistamines, Chlorpheniramine-Type Other (See Comments)    Urinary frequency/urgency.    Family History: Family History  Problem Relation Age of Onset  . Prostate cancer Father   . Migraines Sister     Social History:  reports that  has never smoked. he has never used smokeless tobacco. He reports that he drinks alcohol. He reports that he does not use drugs.  ROS: UROLOGY Frequent Urination?: No Hard to postpone urination?: No Burning/pain with urination?: Yes Get up at night to urinate?: Yes Leakage of urine?: No Urine stream starts and stops?: No Trouble starting stream?: Yes Do you have to strain to urinate?: No Blood in urine?: No Urinary tract infection?: Yes Sexually transmitted disease?: No Injury to kidneys or bladder?: No Painful intercourse?: No Weak stream?: No Erection problems?: No Penile pain?: No  Gastrointestinal Nausea?: Yes Vomiting?: No Indigestion/heartburn?: No Diarrhea?: No Constipation?: Yes  Constitutional Fever: No Night sweats?: No Weight loss?: No Fatigue?: Yes  Skin Skin rash/lesions?: No Itching?: No  Eyes Blurred vision?: No Double vision?: No  Ears/Nose/Throat Sore throat?: No Sinus problems?: No  Hematologic/Lymphatic Swollen glands?: No Easy bruising?: No  Cardiovascular Leg swelling?: No Chest pain?: No  Respiratory Cough?: No Shortness of breath?: No  Endocrine Excessive thirst?: No  Musculoskeletal Back pain?: No Joint pain?: No  Neurological Headaches?: Yes Dizziness?: Yes  Psychologic Depression?: No Anxiety?: No  Physical Exam: BP 125/80 (BP Location: Right Arm, Patient Position: Sitting, Cuff Size: Normal)   Pulse 83   Temp 98.1 F (36.7 C) (Oral)   Ht 6\' 1"  (1.854 m)   Wt  220 lb (99.8 kg)   BMI 29.03 kg/m   Constitutional:  Alert and oriented, No acute distress. HEENT: [place] AT, moist mucus membranes.  Trachea midline, no masses. Cardiovascular: No clubbing, cyanosis, or edema. Respiratory: Normal respiratory effort, no increased work of breathing. GI: Abdomen is soft, nontender, nondistended, no abdominal masses GU: No CVA tenderness.  Skin: No rashes, bruises or suspicious lesions. Lymph: No cervical or inguinal adenopathy. Neurologic: Grossly intact, no focal deficits, moving all 4 extremities. Psychiatric: Normal mood and affect.  Laboratory Data: Lab Results  Component Value Date   WBC 4.6 07/13/2016   HGB 15.8 07/13/2016   HCT 46.9 07/13/2016   MCV 97.3 07/13/2016   PLT 176.0 07/13/2016    Lab Results  Component Value Date   CREATININE 0.84 07/13/2016    Lab Results  Component  Value Date   PSA1 0.9 03/08/2017     Urinalysis Lab Results  Component Value Date   SPECGRAV >1.030 (H) 06/22/2017   PHUR 6.0 06/22/2017   COLORU Orange 06/22/2017   APPEARANCEUR Cloudy (A) 06/22/2017   LEUKOCYTESUR Trace (A) 06/22/2017   PROTEINUR Negative 06/22/2017   GLUCOSEU Negative 06/22/2017   KETONESU Trace (A) 06/22/2017   RBCU 3+ (A) 06/22/2017   BILIRUBINUR Negative 06/22/2017   UUROB 0.2 06/22/2017   NITRITE Positive (A) 06/22/2017    Lab Results  Component Value Date   LABMICR See below: 06/22/2017   WBCUA 11-30 (A) 06/22/2017   RBCUA 11-30 (A) 06/22/2017   LABEPIT None seen 06/22/2017   MUCUS Present (A) 06/22/2017   BACTERIA Many (A) 06/22/2017    Assessment & Plan:  Clinical presentation consistent with acute prostatitis. Urinalysis with significant pyuria and microhematuria.  The urine was sent for culture.  Will start on Cipro 500 mg twice daily times 10 days and will follow up with an additional 20 days of a culture specific antibiotic.  Rx tamsulosin was also sent to his pharmacy.  1. Acute prostatitis  - Urinalysis,  Complete   Abbie Sons, MD  [facility] [address] [place], [place] [address] [phone]

## 2017-06-24 ENCOUNTER — Encounter: Payer: Self-pay | Admitting: Urology

## 2017-06-25 ENCOUNTER — Telehealth: Payer: Self-pay | Admitting: Urology

## 2017-06-25 NOTE — Telephone Encounter (Signed)
Spoke with pt in reference to u/a and cx. Pt voiced understanding.

## 2017-06-25 NOTE — Telephone Encounter (Signed)
Patient left a voicemail wanting a call back about his UA results from 06-22-17 Please call him @ [facility]

## 2017-06-25 NOTE — Telephone Encounter (Signed)
Please contact patient.  UA was consistent with infection.  Urine culture is growing bacteria however the final culture report is pending

## 2017-06-26 LAB — CULTURE, URINE COMPREHENSIVE

## 2017-06-30 ENCOUNTER — Telehealth: Payer: Self-pay

## 2017-06-30 MED ORDER — SULFAMETHOXAZOLE-TRIMETHOPRIM 800-160 MG PO TABS: 1.0000 | ORAL_TABLET | Freq: Two times a day (BID) | ORAL | 0 refills | Status: AC

## 2017-06-30 MED ORDER — TAMSULOSIN HCL 0.4 MG PO CAPS: 0.4000 mg | ORAL_CAPSULE | Freq: Every day | ORAL | 6 refills | Status: DC

## 2017-06-30 NOTE — Telephone Encounter (Signed)
-----   Message from Abbie Sons, MD sent at 06/30/2017  8:48 AM EST ----- Urine culture was positive.  Please send in prescription for Septra DS 1 twice daily times 20 days to start after he completes his 10-day course of Cipro.

## 2017-06-30 NOTE — Telephone Encounter (Signed)
Spoke with pt in reference to septra ds and cipro. Pt requested a refill of flomax. Refills given. Pt voiced understanding of whole conversation.

## 2017-06-30 NOTE — Telephone Encounter (Signed)
LMOM-medication sent to pharmacy 

## 2017-07-05 ENCOUNTER — Ambulatory Visit: Payer: BC Managed Care – PPO | Admitting: Urology

## 2017-07-05 ENCOUNTER — Telehealth: Payer: Self-pay

## 2017-07-05 NOTE — Telephone Encounter (Signed)
Pt called stating he is still having worrisome urinary symptoms even though he is on abx.  Per Dr. Bernardo Heater pt does not need to be seen today and needs to be reassured of symptoms. Spoke with pt in reference to urinary symptoms. Pt voiced concern of prostate cancer.  Reinforced with pt PSA as of 02/2017 was 0.9 which is very good. Encouraged pt to continue abx and flomax.  Also made pt aware prostatitis can take several weeks to see improvement. Pt voiced understanding of whole conversation.

## 2017-07-08 ENCOUNTER — Telehealth: Payer: Self-pay | Admitting: Family Medicine

## 2017-07-08 ENCOUNTER — Telehealth: Payer: Self-pay | Admitting: Urology

## 2017-07-08 ENCOUNTER — Telehealth: Payer: Self-pay

## 2017-07-08 ENCOUNTER — Ambulatory Visit: Payer: Self-pay | Admitting: *Deleted

## 2017-07-08 NOTE — Telephone Encounter (Signed)
Patient is calling to report that he has been seen by his urologist and he is presently being treated with bactrim for infection. He wants to know what antibiotic he has taken for his infection in the past.  In looking back in his chart for him- Cipro has most commonly been used. He seems to be upset because he is still having symptoms and he seems to not be getting better. He has contacted the urologist office and has been told to continue the Bactrim. Advised patient that he should contact them and let them know that he is still having symptoms and ask if there is something that they can give him to treat his symptoms while he is taking the antibiotic. He agrees that would be a good idea.

## 2017-07-08 NOTE — Telephone Encounter (Signed)
Pt called office [facility] stating that he is still experiencing pain with his prostate infection.  Asks is there some kind of bladder numbing medication that he could take to help him through this and to help him get some sleep at night.  Pt sounded like he was in distress. Please advise. Thanks.

## 2017-07-08 NOTE — Telephone Encounter (Signed)
Patient is using AZO and wants to know if it will interfere with his lab work when he comes for his CPE. May leave message on voice mail.

## 2017-07-08 NOTE — Telephone Encounter (Signed)
Agree with  recs for him to tell urologist office he is not improving given they are currently treating him.

## 2017-07-08 NOTE — Telephone Encounter (Signed)
Pt called stating he is having horrible constipation with abx and still does not have any relief from symptoms. Reinforced with pt can take a laxative to help with constipation and symptoms can last several weeks with prostatitis. Encouraged pt to increase fluid in take to help with symptoms and constipation. Pt voiced understanding.

## 2017-07-09 ENCOUNTER — Ambulatory Visit: Payer: Self-pay | Admitting: *Deleted

## 2017-07-09 NOTE — Telephone Encounter (Signed)
Pt dx with acute prostatitis 06/22/17. Recommended AZO which he has been taking for 2 days. Pt states package reads "Do not take more than 2 days without consulting physician."  Pt questioning if OK to continue. Please advise. [phone]  Answer Assessment - Initial Assessment Questions 1. SYMPTOMS: "Do you have any symptoms?"     Yes. DX with prostate infection. Has question Re: AZO 2. SEVERITY: If symptoms are present, ask "Are they mild, moderate or severe?"  Protocols used: MEDICATION QUESTION CALL-A-AH

## 2017-07-09 NOTE — Telephone Encounter (Signed)
no

## 2017-07-09 NOTE — Telephone Encounter (Signed)
pt saw the urologist on 06/22/17; per DPR left vm that pt should contact Dr Dene Gentry office to see if should continue the [place]. FYI to Dr Diona Browner.

## 2017-07-09 NOTE — Telephone Encounter (Signed)
Left message for Mr. Joshua Lang that taking AZO recently will not interfere with his upcoming lab work.

## 2017-07-09 NOTE — Telephone Encounter (Signed)
See previous note

## 2017-07-14 ENCOUNTER — Other Ambulatory Visit: Payer: Self-pay

## 2017-07-14 ENCOUNTER — Encounter: Payer: Self-pay | Admitting: Family Medicine

## 2017-07-14 ENCOUNTER — Ambulatory Visit (INDEPENDENT_AMBULATORY_CARE_PROVIDER_SITE_OTHER): Payer: BC Managed Care – PPO | Admitting: Family Medicine

## 2017-07-14 VITALS — BP 98/60 | HR 72 | Temp 97.5°F | Ht 73.0 in | Wt 217.8 lb

## 2017-07-14 DIAGNOSIS — Z0000 Encounter for general adult medical examination without abnormal findings: Secondary | ICD-10-CM | POA: Diagnosis not present

## 2017-07-14 LAB — CBC WITH DIFFERENTIAL/PLATELET
Basophils Absolute: 0 10*3/uL (ref 0.0–0.1)
Basophils Relative: 0.7 % (ref 0.0–3.0)
EOS ABS: 0.1 10*3/uL (ref 0.0–0.7)
EOS PCT: 2.5 % (ref 0.0–5.0)
HEMATOCRIT: 45.3 % (ref 39.0–52.0)
HEMOGLOBIN: 15.5 g/dL (ref 13.0–17.0)
Lymphocytes Relative: 26.4 % (ref 12.0–46.0)
Lymphs Abs: 1.3 10*3/uL (ref 0.7–4.0)
MCHC: 34.2 g/dL (ref 30.0–36.0)
MCV: 94.8 fl (ref 78.0–100.0)
MONO ABS: 0.5 10*3/uL (ref 0.1–1.0)
Monocytes Relative: 10.7 % (ref 3.0–12.0)
NEUTROS ABS: 2.9 10*3/uL (ref 1.4–7.7)
Neutrophils Relative %: 59.7 % (ref 43.0–77.0)
PLATELETS: 244 10*3/uL (ref 150.0–400.0)
RBC: 4.78 Mil/uL (ref 4.22–5.81)
RDW: 13.5 % (ref 11.5–15.5)
WBC: 4.9 10*3/uL (ref 4.0–10.5)

## 2017-07-14 LAB — BASIC METABOLIC PANEL
BUN: 12 mg/dL (ref 6–23)
CHLORIDE: 102 meq/L (ref 96–112)
CO2: 30 meq/L (ref 19–32)
CREATININE: 1.06 mg/dL (ref 0.40–1.50)
Calcium: 9.9 mg/dL (ref 8.4–10.5)
GFR: 75.55 mL/min (ref >60.00)
GLUCOSE: 94 mg/dL (ref 70–99)
POTASSIUM: 4.3 meq/L (ref 3.5–5.1)
Sodium: 138 mEq/L (ref 135–145)

## 2017-07-14 LAB — HEPATIC FUNCTION PANEL
ALT: 19 U/L (ref 0–53)
AST: 19 U/L (ref 0–37)
Albumin: 4.4 g/dL (ref 3.5–5.2)
Alkaline Phosphatase: 63 U/L (ref 39–117)
BILIRUBIN TOTAL: 0.4 mg/dL (ref 0.2–1.2)
Bilirubin, Direct: 0.2 mg/dL (ref 0.0–0.3)
TOTAL PROTEIN: 7.9 g/dL (ref 6.0–8.3)

## 2017-07-14 LAB — LIPID PANEL
CHOLESTEROL: 161 mg/dL (ref 0–200)
HDL: 45.8 mg/dL (ref >39.00)
LDL Cholesterol: 102 mg/dL — ABNORMAL HIGH (ref 0–99)
NonHDL: 115.61
TRIGLYCERIDES: 68 mg/dL (ref 0.0–149.0)
Total CHOL/HDL Ratio: 4
VLDL: 13.6 mg/dL (ref 0.0–40.0)

## 2017-07-14 MED ORDER — FLUTICASONE PROPIONATE 50 MCG/ACT NA SUSP: 2.0000 | Freq: Every day | NASAL | 11 refills | Status: DC

## 2017-07-14 MED ORDER — SILDENAFIL CITRATE 20 MG PO TABS: ORAL_TABLET | ORAL | 11 refills | Status: DC

## 2017-07-14 MED ORDER — SIMVASTATIN 40 MG PO TABS: 40.0000 mg | ORAL_TABLET | Freq: Every day | ORAL | 3 refills | Status: DC

## 2017-07-14 NOTE — Progress Notes (Signed)
Dr. Frederico Hamman T. Daron Stutz, MD, [facility] [place], [address] Phone: [phone] Fax: [phone]  07/14/2017  Patient: Joshua Lang, MRN: [id number], DOB: [birth year], 62 y.o.  Primary Physician:  Owens Loffler, MD   Chief Complaint  Patient presents with  . Annual Exam   Subjective:   Joshua Lang is a 62 y.o. pleasant patient who presents with the following:  Preventative Health Maintenance Visit:  Health Maintenance Summary Reviewed and updated, unless pt declines services.  Tobacco History Reviewed. Alcohol: No concerns, no excessive use Exercise Habits: Very activity, rec at least 30 mins 5 times a week STD concerns: no risk or activity to increase risk Drug Use: None Encouraged self-testicular check  Feb 12, prostate infection.  Cipro x 10 days Now on Septra x 12 days.   Destroyed the GI system.   Nov. 6 had a choking episode.   Did not go to sleep study.   Saw Jim Hooten last week.   Toenail fungus.    Health Maintenance  Topic Date Due  . COLONOSCOPY  12/09/2017  . TETANUS/TDAP  12/31/2025  . INFLUENZA VACCINE  Completed  . Hepatitis C Screening  Completed  . HIV Screening  Completed   Immunization History  Administered Date(s) Administered  . Influenza Whole 02/08/2009, 02/07/2012  . Influenza, Seasonal, Injecte, Preservative Fre 01/08/2014  . Influenza,inj,Quad PF,6+ Mos 01/09/2015, 01/01/2016, 01/14/2017  . Pneumococcal Conjugate-13 04/23/2015  . Pneumococcal Polysaccharide-23 03/28/2016  . Td 02/13/2009  . Tdap 01/01/2016  . Zoster 06/22/2013  . Zoster Recombinat (Shingrix) 01/14/2017, 03/23/2017   Patient Active Problem List   Diagnosis Date Noted  . Benign prostatic hyperplasia without lower urinary tract symptoms 03/10/2017  . Chronic prostatitis 03/10/2017  . Hernia of abdominal cavity 01/23/2016  . S/P total hip arthroplasty 05/20/2015  . ALLERGIC RHINITIS 02/17/2009    . 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 UNEQUAL LEG LENGTH 02/13/2009  . Sleep apnea 02/13/2009  . COLONIC POLYPS 10/30/2008  . HYPERCHOLESTEROLEMIA 10/30/2008  . NEPHROLITHIASIS 10/30/2008  . DEGENERATIVE JOINT DISEASE, HIPS 10/30/2008   Past Medical History:  Diagnosis Date  . Allergic rhinitis   . 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 Colonic polyp   . ED (erectile dysfunction)   . Enlarged prostate    Moderately  . GERD (gastroesophageal reflux disease)   . History of kidney stones    H/O  . Hypercholesteremia   . Nephrolithiasis   . Prostate infection    STARTED CIPRO PER UROLOGIST ON 01-28-16 AND IS TO CONTINUE FOR 6 WEEKS   Past Surgical History:  Procedure Laterality Date  . COLONOSCOPY  2015  . HERNIA REPAIR Left 1990's  . INGUINAL HERNIA REPAIR Right 03/24/2016   Procedure: HERNIA REPAIR INGUINAL ADULT;  Surgeon: Robert Bellow, MD;  Location: [facility] ORS;  Service: General;  Laterality: Right;  . JOINT REPLACEMENT Bilateral    THR  . SHOULDER OPEN ROTATOR CUFF REPAIR  2014   Hooten, partial RTC tear with probably SAD, possible DCE  . SHOULDER SURGERY  2009   open, 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, (Jim Hooten)  . TOTAL HIP ARTHROPLASTY  09/2009   (Hooten)  . TOTAL HIP ARTHROPLASTY Left 05/20/2015   Procedure: TOTAL HIP ARTHROPLASTY;  Surgeon: Dereck Leep, MD;  Location: [facility] ORS;  Service: Orthopedics;  Laterality: Left;   Social History   Socioeconomic History  . Marital status: Single    Spouse name:  Not on file  . Number of children: Not on file  . Years of education: Not on file  . Highest education level: Not on file  Social Needs  . Financial resource strain: Not on file  . Food insecurity - worry: Not on file  . Food insecurity - inability: Not on file  . Transportation needs - medical: Not on file  . Transportation needs - non-medical: Not on file  Occupational History  . Occupation: [employer], Environmental manager, head    Comment: Doctorate  Tobacco Use  .  Smoking status: Never Smoker  . Smokeless tobacco: Never Used  Substance and Sexual Activity  . Alcohol use: Yes    Comment: occassional beer  . Drug use: No  . Sexual activity: Not on file  Other Topics Concern  . Not on file  Social History Narrative  . Not on file   Family History  Problem Relation Age of Onset  . Prostate cancer Father   . Migraines Sister    Allergies  Allergen Reactions  . Oxycodone Itching    And hyper feeling/mind races  . Terbinafine And Related     Trouble sleeping, depression & fatigue  . Tramadol Itching  . Antihistamines, Chlorpheniramine-Type Other (See Comments)    Urinary frequency/urgency.    Medication list has been reviewed and updated.   General: Denies fever, chills, sweats. No significant weight loss. Eyes: Denies blurring,significant itching ENT: Denies earache, sore throat, and hoarseness. Cardiovascular: Denies chest pains, palpitations, dyspnea on exertion Respiratory: Denies cough, dyspnea at rest,wheeezing Breast: no concerns about lumps GI: above GU: above Musculoskeletal: Denies back pain, joint pain Derm: Denies rash, itching Neuro: Denies  paresthesias, frequent falls, frequent headaches Psych: Denies depression, anxiety Endocrine: Denies cold intolerance, heat intolerance, polydipsia Heme: Denies enlarged lymph nodes Allergy: No hayfever  Objective:   BP 98/60   Pulse 72   Temp (!) 97.5 F (36.4 C) (Oral)   Ht 6' 1"  (1.854 m)   Wt 217 lb 12 oz (98.8 kg)   BMI 28.73 kg/m  Ideal Body Weight: Weight in (lb) to have BMI = 25: 189.1  No exam data present  GEN: well developed, well nourished, no acute distress Eyes: conjunctiva and lids normal, PERRLA, EOMI ENT: TM clear, nares clear, oral exam WNL Neck: supple, no lymphadenopathy, no thyromegaly, no JVD Pulm: clear to auscultation and percussion, respiratory effort normal CV: regular rate and rhythm, S1-S2, no murmur, rub or gallop, no bruits, peripheral  pulses normal and symmetric, no cyanosis, clubbing, edema or varicosities GI: soft, non-tender; no hepatosplenomegaly, masses; active bowel sounds all quadrants GU: no hernia, testicular mass, penile discharge Lymph: no cervical, axillary or inguinal adenopathy MSK: gait normal, muscle tone and strength WNL, no joint swelling, effusions, discoloration, crepitus  SKIN: clear, good turgor, color WNL, no rashes, lesions, or ulcerations Neuro: normal mental status, normal strength, sensation, and motion Psych: alert; oriented to person, place and time, normally interactive and not anxious or depressed in appearance. All labs reviewed with patient.  Lipids:    Component Value Date/Time   CHOL 174 07/13/2016 1520   TRIG 65.0 07/13/2016 1520   HDL 52.00 07/13/2016 1520   VLDL 13.0 07/13/2016 1520   CHOLHDL 3 07/13/2016 1520   CBC: CBC Latest Ref Rng & Units 07/13/2016 07/10/2015 05/22/2015  WBC 4.0 - 10.5 K/uL 4.6 4.6 8.6  Hemoglobin 13.0 - 17.0 g/dL 15.8 14.9 12.3(L)  Hematocrit 39.0 - 52.0 % 46.9 44.6 35.9(L)  Platelets 150.0 - 400.0 K/uL  176.0 219.0 129(L)    Basic Metabolic Panel:    Component Value Date/Time   NA 141 07/13/2016 1520   K 4.6 07/13/2016 1520   CL 104 07/13/2016 1520   CO2 29 07/13/2016 1520   BUN 13 07/13/2016 1520   CREATININE 0.84 07/13/2016 1520   GLUCOSE 90 07/13/2016 1520   CALCIUM 9.5 07/13/2016 1520   Hepatic Function Latest Ref Rng & Units 07/13/2016 07/10/2015 07/04/2014  Total Protein 6.0 - 8.3 g/dL 7.3 7.5 7.6  Albumin 3.5 - 5.2 g/dL 4.5 4.7 4.7  AST 0 - 37 U/L 20 20 25   ALT 0 - 53 U/L 22 19 24   Alk Phosphatase 39 - 117 U/L 60 78 66  Total Bilirubin 0.2 - 1.2 mg/dL 0.5 0.6 0.6  Bilirubin, Direct 0.0 - 0.3 mg/dL 0.1 0.1 0.1    Lab Results  Component Value Date   TSH 1.23 03/12/2010   Lab Results  Component Value Date   PSA 0.84 07/13/2016   PSA 1.48 07/10/2015   PSA 0.79 07/04/2014    Assessment and Plan:   Healthcare maintenance - Plan:  Basic metabolic panel, CBC with Differential/Platelet, Hepatic function panel, Lipid panel  Health Maintenance Exam: The patient's preventative maintenance and recommended screening tests for an annual wellness exam were reviewed in full today. Brought up to date unless services declined.  Counselled on the importance of diet, exercise, and its role in overall health and mortality. The patient's FH and SH was reviewed, including their home life, tobacco status, and drug and alcohol status.  Follow-up in 1 year for physical exam or additional follow-up below.  Follow-up: No Follow-up on file. Or follow-up in 1 year if not noted.  Meds ordered this encounter  Medications  . simvastatin (ZOCOR) 40 MG tablet    Sig: Take 1 tablet (40 mg total) by mouth at bedtime.    Dispense:  90 tablet    Refill:  3  . fluticasone (FLONASE) 50 MCG/ACT nasal spray    Sig: Place 2 sprays into both nostrils daily.    Dispense:  16 g    Refill:  11  . sildenafil (REVATIO) 20 MG tablet    Sig: Take 2 to 5 tablets by mouth 30 minutes prior to intercourse.    Dispense:  50 tablet    Refill:  11   Medications Discontinued During This Encounter  Medication Reason  . Melatonin 3 MG TABS Change in therapy  . simvastatin (ZOCOR) 40 MG tablet Reorder  . fluticasone (FLONASE) 50 MCG/ACT nasal spray Reorder  . sildenafil (REVATIO) 20 MG tablet Reorder   Orders Placed This Encounter  Procedures  . Basic metabolic panel  . CBC with Differential/Platelet  . Hepatic function panel  . Lipid panel    Signed,  Frederico Hamman T. Jamyla Ard, MD   Allergies as of 07/14/2017      Reactions   Oxycodone Itching   And hyper feeling/mind races   Terbinafine And Related    Trouble sleeping, depression & fatigue   Tramadol Itching   Antihistamines, Chlorpheniramine-type Other (See Comments)   Urinary frequency/urgency.      Medication List        Accurate as of 07/14/17  1:58 PM. Always use your most recent med list.            aspirin 81 MG chewable tablet Chew by mouth daily.   Calcium Carbonate-Vitamin D 600-400 MG-UNIT tablet Take 1 tablet by mouth 2 (two) times daily.   cholecalciferol 1000  units tablet Commonly known as:  VITAMIN D Take 2,000 Units by mouth daily.   famotidine 20 MG tablet Commonly known as:  PEPCID Take 20 mg by mouth daily as needed for heartburn or indigestion.   FISH OIL OMEGA-3 PO Take by mouth 2 (two) times daily. 1200-360 mg.  Take one capsule by mouth daily   fluocinonide cream 0.05 % Commonly known as:  LIDEX Apply bid as directed   fluticasone 50 MCG/ACT nasal spray Commonly known as:  FLONASE Place 2 sprays into both nostrils daily.   ketotifen 0.025 % ophthalmic solution Commonly known as:  ZADITOR Place 2 drops into both eyes daily as needed (for dry/irritated eyes).   Melatonin 5 MG Tabs Take by mouth.   multivitamin tablet Take 1 tablet by mouth 2 (two) times daily.   polycarbophil 625 MG tablet Commonly known as:  FIBERCON Take 1,875 mg by mouth daily as needed (for digestive regularity.).   PROBIOTIC DAILY PO Take 1 capsule by mouth at bedtime.   RESVERATROL PO Take 500 mg by mouth daily.   Saw Palmetto 450 MG Caps Take 1 capsule by mouth 2 (two) times daily.   sildenafil 20 MG tablet Commonly known as:  REVATIO Take 2 to 5 tablets by mouth 30 minutes prior to intercourse.   simvastatin 40 MG tablet Commonly known as:  ZOCOR Take 1 tablet (40 mg total) by mouth at bedtime.   sulfamethoxazole-trimethoprim 800-160 MG tablet Commonly known as:  BACTRIM DS,SEPTRA DS Take 1 tablet by mouth 2 (two) times daily for 20 days.   SUPER B COMPLEX Tabs Take 1 tablet by mouth 2 (two) times daily.   SYSTANE OP Apply 2 drops to eye 2 (two) times daily as needed (for dry/scratchy/irritated eyes).   tamsulosin 0.4 MG Caps capsule Commonly known as:  FLOMAX Take 1 capsule (0.4 mg total) by mouth daily.   vitamin C with rose hips 1000 MG  tablet Take 1,000 mg by mouth 2 (two) times daily.

## 2017-07-19 ENCOUNTER — Encounter: Payer: Self-pay | Admitting: *Deleted

## 2017-07-21 ENCOUNTER — Telehealth: Payer: Self-pay | Admitting: Urology

## 2017-07-21 ENCOUNTER — Telehealth: Payer: Self-pay

## 2017-07-21 NOTE — Telephone Encounter (Signed)
Pt has one more day of antibiotics and pt thinks symptoms have gone away, pt wants to check in with nurse, pt really wants to speak with a nurse briefly, wants some expert advise.  Please advise. Thanks.

## 2017-07-21 NOTE — Telephone Encounter (Signed)
Pt called stating he is finally over his prostatitis and was given some prostavex. Pt wanted to know if this was safe for him to take. Please advise.

## 2017-07-22 NOTE — Telephone Encounter (Signed)
Prostate supplements are fine to take.  They may or may not provide benefit however should not cause any harm

## 2017-07-26 NOTE — Telephone Encounter (Signed)
Spoke with pt in reference to prostate supplements. Pt voiced understanding.

## 2017-08-10 ENCOUNTER — Telehealth: Payer: Self-pay | Admitting: Urology

## 2017-08-10 NOTE — Telephone Encounter (Signed)
Patient is wanting to know what is the value of pumpkin seed oil? Astaxanthin  colodial silver  And wants to know about these two things and what they are good for? Please advise For prostate health [phone]

## 2017-08-11 NOTE — Telephone Encounter (Signed)
These are dietary supplements that may be beneficial for prostate enlargement and inflammation.  Taking these should cause no harm however may not provide any benefit either.

## 2017-08-11 NOTE — Telephone Encounter (Signed)
Spoke to patient and all questions answered.  Joshua Lang

## 2017-09-27 ENCOUNTER — Ambulatory Visit: Payer: BC Managed Care – PPO | Admitting: Internal Medicine

## 2017-09-27 ENCOUNTER — Encounter: Payer: Self-pay | Admitting: Internal Medicine

## 2017-09-27 VITALS — BP 120/80 | HR 78 | Temp 98.8°F | Wt 218.0 lb

## 2017-09-27 DIAGNOSIS — J0100 Acute maxillary sinusitis, unspecified: Secondary | ICD-10-CM

## 2017-09-27 MED ORDER — AZITHROMYCIN 250 MG PO TABS
ORAL_TABLET | ORAL | 0 refills | Status: DC
Start: 2017-09-27 — End: 2018-03-17

## 2017-09-27 MED ORDER — HYDROCOD POLST-CPM POLST ER 10-8 MG/5ML PO SUER: 5.0000 mL | Freq: Every evening | ORAL | 0 refills | Status: DC | PRN

## 2017-09-27 NOTE — Patient Instructions (Signed)

## 2017-09-27 NOTE — Progress Notes (Signed)
HPI  Pt presents to the clinic today with c/o headache, facial pressure, nasal congestion, sore throat and cough. He reports this started 1-2 weeks ago. He is blowing clear mucous out of his nose. He denies difficulty swallowing. The cough is productive of clear/yellow mucous at times. He feels some tightness in his chest but denies chest pain or shortness of breath. He denies fever, chills or body aches. He has tried Mucinex with minimal relief. He has a history of allergies. He has had sick contacts.  Review of Systems     Past Medical History:  Diagnosis Date  . Allergic rhinitis   . 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 Colonic polyp   . ED (erectile dysfunction)   . Enlarged prostate    Moderately  . GERD (gastroesophageal reflux disease)   . History of kidney stones    H/O  . Hypercholesteremia   . Nephrolithiasis   . Prostate infection    STARTED CIPRO PER UROLOGIST ON 01-28-16 AND IS TO CONTINUE FOR 6 WEEKS    Family History  Problem Relation Age of Onset  . Prostate cancer Father   . Migraines Sister     Social History   Socioeconomic History  . Marital status: Single    Spouse name: Not on file  . Number of children: Not on file  . Years of education: Not on file  . Highest education level: Not on file  Occupational History  . Occupation: [employer], Environmental manager, head    Comment: Doctorate  Social Needs  . Financial resource strain: Not on file  . Food insecurity:    Worry: Not on file    Inability: Not on file  . Transportation needs:    Medical: Not on file    Non-medical: Not on file  Tobacco Use  . Smoking status: Never Smoker  . Smokeless tobacco: Never Used  Substance and Sexual Activity  . Alcohol use: Yes    Comment: occassional beer  . Drug use: No  . Sexual activity: Not on file  Lifestyle  . Physical activity:    Days per week: Not on file    Minutes per session: Not on file  . Stress: Not on file  Relationships  . Social connections:    Talks on phone: Not on file    Gets together: Not on file    Attends religious service: Not on file    Active member of club or organization: Not on file    Attends meetings of clubs or organizations: Not on file    Relationship status: Not on file  . Intimate partner violence:    Fear of current or ex partner: Not on file    Emotionally abused: Not on file    Physically abused: Not on file    Forced sexual activity: Not on file  Other Topics Concern  . Not on file  Social History Narrative  . Not on file    Allergies  Allergen Reactions  . Oxycodone Itching    And hyper feeling/mind races  . Terbinafine And Related     Trouble sleeping, depression & fatigue  . Tramadol Itching  . Antihistamines, Chlorpheniramine-Type Other (See Comments)    Urinary frequency/urgency.     Constitutional: Positive headache. Denies fatigue, fever or abrupt weight changes.  HEENT:  Positive facial pain, nasal congestion and sore throat. Denies eye redness, ear pain, ringing in the ears, wax buildup, runny nose or bloody nose. Respiratory: Positive  cough. Denies difficulty breathing or shortness of breath.  Cardiovascular: Denies chest pain, chest tightness, palpitations or swelling in the hands or feet.   No other specific complaints in a complete review of systems (except as listed in HPI above).  Objective:   BP 120/80   Pulse 78   Temp 98.8 F (37.1 C) (Oral)   Wt 218 lb (98.9 kg)   SpO2 98%   BMI 28.76 kg/m   General: Appears his stated age, in NAD. HEENT: Head: normal shape and size, maxillary sinus tenderness noted;Ears: Tm's gray and intact, normal light reflex; Nose: mucosa boggy and moist, septum midline; Throat/Mouth: + PND. Teeth present, mucosa erythematous and moist, no exudate noted, no lesions or ulcerations noted.  Neck:  No adenopathy noted.  Cardiovascular: Normal rate and rhythm. S1,S2 noted.  No murmur, rubs or gallops noted.  Pulmonary/Chest: Normal effort and  positive vesicular breath sounds. No respiratory distress. No wheezes, rales or ronchi noted.       Assessment & Plan:   Acute Maxillary Sinusitis  Can use a Neti Pot which can be purchased from your local drug store. Flonase 2 sprays each nostril for 3 days and then as needed. eRx for Azithromycin x 5 days eRx for Tussionex for cough  RTC as needed or if symptoms persist. Webb Silversmith, NP

## 2017-10-15 ENCOUNTER — Other Ambulatory Visit: Payer: Self-pay | Admitting: Internal Medicine

## 2017-10-15 ENCOUNTER — Telehealth: Payer: Self-pay | Admitting: Family Medicine

## 2017-10-15 MED ORDER — GUAIFENESIN-CODEINE 100-10 MG/5ML PO SOLN: 5.0000 mL | Freq: Four times a day (QID) | ORAL | 0 refills | Status: DC | PRN

## 2017-10-15 NOTE — Addendum Note (Signed)
Addended by: Pilar Grammes on: 10/15/2017 02:59 PM   Modules accepted: Orders

## 2017-10-15 NOTE — Telephone Encounter (Signed)
Spoke to pt

## 2017-10-15 NOTE — Telephone Encounter (Signed)
Robitussin AC sent to pharmacy

## 2017-10-15 NOTE — Telephone Encounter (Signed)
Copied from [place] [id number]. Topic: Quick Communication - See Telephone Encounter >> Oct 15, 2017 12:46 PM Percell Belt A wrote: CRM for notification. See Telephone encounter for: 10/15/17. Pt called in and stated that he was give chlorpheniramine-HYDROcodone [facility]) 10-8 MG/5ML Latanya Presser [[id number]] .  Pt never picked this med up until yesterday.  Pt would like to make sure that he would like DR to know that he would doesn't ever want anything with Hydrocodone in it.  He said he does not do well with that and he would like a cough med with codeine only

## 2018-01-28 ENCOUNTER — Telehealth: Payer: Self-pay | Admitting: Urology

## 2018-01-28 MED ORDER — TAMSULOSIN HCL 0.4 MG PO CAPS: 0.4000 mg | ORAL_CAPSULE | Freq: Every day | ORAL | 0 refills | Status: DC

## 2018-01-28 NOTE — Addendum Note (Signed)
Addended by: Garnette Gunner on: 01/28/2018 03:09 PM   Modules accepted: Orders

## 2018-01-28 NOTE — Telephone Encounter (Signed)
Pt calls office stating [facility] has attempted to get refills on his Tamsulosin multiple times with no success, pt calls office asking if we can send this refill to [facility] in [place], he has his 1 yr follow up in October 2019. Please advise. Thanks

## 2018-02-23 ENCOUNTER — Other Ambulatory Visit: Payer: Self-pay | Admitting: Student

## 2018-02-23 DIAGNOSIS — S4991XD Unspecified injury of right shoulder and upper arm, subsequent encounter: Secondary | ICD-10-CM

## 2018-02-23 DIAGNOSIS — M7581 Other shoulder lesions, right shoulder: Secondary | ICD-10-CM

## 2018-02-23 DIAGNOSIS — Z9889 Other specified postprocedural states: Secondary | ICD-10-CM

## 2018-03-02 ENCOUNTER — Ambulatory Visit
Admission: RE | Admit: 2018-03-02 | Discharge: 2018-03-02 | Disposition: A | Discharge status: Home / Self Care | Payer: BC Managed Care – PPO | Source: Ambulatory Visit | Attending: Student | Admitting: Student

## 2018-03-02 DIAGNOSIS — X58XXXD Exposure to other specified factors, subsequent encounter: Secondary | ICD-10-CM | POA: Insufficient documentation

## 2018-03-02 DIAGNOSIS — M75121 Complete rotator cuff tear or rupture of right shoulder, not specified as traumatic: Secondary | ICD-10-CM | POA: Diagnosis not present

## 2018-03-02 DIAGNOSIS — S4991XD Unspecified injury of right shoulder and upper arm, subsequent encounter: Secondary | ICD-10-CM

## 2018-03-02 DIAGNOSIS — M7581 Other shoulder lesions, right shoulder: Secondary | ICD-10-CM

## 2018-03-02 DIAGNOSIS — Z9889 Other specified postprocedural states: Secondary | ICD-10-CM | POA: Diagnosis not present

## 2018-03-08 ENCOUNTER — Telehealth: Payer: Self-pay | Admitting: Urology

## 2018-03-08 MED ORDER — TAMSULOSIN HCL 0.4 MG PO CAPS: 0.4000 mg | ORAL_CAPSULE | Freq: Every day | ORAL | 1 refills | Status: DC

## 2018-03-08 NOTE — Telephone Encounter (Signed)
Pt has appt w/[facility] 11-7, but will run out of Tamsulosin before then.  Can we give him enough to last til appt.  He didn't get any refills when he saw [facility] before.  He uses [facility] in [place].  Pt is going out of town and he needs to get this today.  Please give pt a call [phone]

## 2018-03-08 NOTE — Telephone Encounter (Signed)
RX sent to pharmacy  

## 2018-03-10 ENCOUNTER — Ambulatory Visit: Payer: BC Managed Care – PPO

## 2018-03-10 ENCOUNTER — Ambulatory Visit: Payer: BC Managed Care – PPO | Admitting: Urology

## 2018-03-16 ENCOUNTER — Encounter
Admission: RE | Admit: 2018-03-16 | Discharge: 2018-03-16 | Disposition: A | Discharge status: Home / Self Care | Payer: BC Managed Care – PPO | Source: Ambulatory Visit | Attending: Surgery | Admitting: Surgery

## 2018-03-16 ENCOUNTER — Other Ambulatory Visit: Payer: Self-pay

## 2018-03-16 HISTORY — DX: Other complications of anesthesia, initial encounter: T88.59XA

## 2018-03-16 HISTORY — DX: Adverse effect of unspecified anesthetic, initial encounter: T41.45XA

## 2018-03-16 NOTE — Patient Instructions (Signed)
Your procedure is scheduled on: 03-22-18  Report to Same Day Surgery 2nd floor medical mall [facility] Entrance-take elevator on left to 2nd floor.  Check in with surgery information desk.) To find out your arrival time please call [phone] between 1PM - 3PM on 03-21-18  Remember: Instructions that are not followed completely may result in serious medical risk, up to and including death, or upon the discretion of your surgeon and anesthesiologist your surgery may need to be rescheduled.    _x___ 1. Do not eat food after midnight the night before your procedure. NO GUM OR CANDY AFTER MIDNIGHT.  You may drink clear liquids up to 2 hours before you are scheduled to arrive at the hospital for your procedure.  Do not drink clear liquids within 2 hours of your scheduled arrival to the hospital.  Clear liquids include  --Water or Apple juice without pulp  --Clear carbohydrate beverage such as ClearFast or Gatorade  --Black Coffee or Clear Tea (No milk, no creamers, do not add anything to the coffee or Tea   ____Ensure clear carbohydrate drink on the way to the hospital for bariatric patients  ____Ensure clear carbohydrate drink 3 hours before surgery for Dr Dwyane Luo patients if physician instructed.   No gum chewing or hard candies.     __x__ 2. No Alcohol for 24 hours before or after surgery.   __x__3. No Smoking or e-cigarettes for 24 prior to surgery.  Do not use any chewable tobacco products for at least 6 hour prior to surgery   ____  4. Bring all medications with you on the day of surgery if instructed.    __x__ 5. Notify your doctor if there is any change in your medical condition     (cold, fever, infections).    x___6. On the morning of surgery brush your teeth with toothpaste and water.  You may rinse your mouth with mouth wash if you wish.  Do not swallow any toothpaste or mouthwash.   Do not wear jewelry, make-up, hairpins, clips or nail polish.  Do not wear lotions,  powders, or perfumes. You may wear deodorant.  Do not shave 48 hours prior to surgery. Men may shave face and neck.  Do not bring valuables to the hospital.    [facility] is not responsible for any belongings or valuables.               Contacts, dentures or bridgework may not be worn into surgery.  Leave your suitcase in the car. After surgery it may be brought to your room.  For patients admitted to the hospital, discharge time is determined by your treatment team.  _  Patients discharged the day of surgery will not be allowed to drive home.  You will need someone to drive you home and stay with you the night of your procedure.    Please read over the following fact sheets that you were given:   [facility] Preparing for Surgery and   _x___ [place] WITH A SMALL SIP OF WATER. These include:  1. PEPCID  2. TAKE A PEPCID THE NIGHT BEFORE SURGERY  3.  4.  5.  6.  ____Fleets enema or Magnesium Citrate as directed.   ____ Use CHG Soap or sage wipes as directed on instruction sheet   ____ Use inhalers on the day of surgery and bring to hospital day of surgery  ____ Stop Metformin and Janumet 2 days prior  to surgery.    ____ Take 1/2 of usual insulin dose the night before surgery and none on the morning surgery.   _x___ Follow recommendations from Cardiologist, Pulmonologist or PCP regarding stopping Aspirin, Coumadin, Plavix ,Eliquis, Effient, or Pradaxa, and Pletal-STOP ASPIRIN NOW  X____Stop Anti-inflammatories such as Advil, Aleve, Ibuprofen, Motrin, Naproxen, Naprosyn, Goodies powders or aspirin products NOW-OK to take Tylenol    _x___ Stop supplements until after surgery-STOP KELP, TOTAL GREENS, PUMPKIN SEED OIL, FISH OIL, VIT C, SAW PALMETTO, RESERVATROL,  AND MELATONIN NOW   ____ Bring C-Pap to the hospital.

## 2018-03-17 ENCOUNTER — Ambulatory Visit (INDEPENDENT_AMBULATORY_CARE_PROVIDER_SITE_OTHER): Payer: BC Managed Care – PPO | Admitting: Urology

## 2018-03-17 ENCOUNTER — Ambulatory Visit: Payer: BC Managed Care – PPO | Admitting: Urology

## 2018-03-17 ENCOUNTER — Encounter: Payer: Self-pay | Admitting: Urology

## 2018-03-17 VITALS — BP 150/88 | HR 69 | Ht 73.0 in | Wt 220.0 lb

## 2018-03-17 DIAGNOSIS — N400 Enlarged prostate without lower urinary tract symptoms: Secondary | ICD-10-CM | POA: Diagnosis not present

## 2018-03-17 MED ORDER — TAMSULOSIN HCL 0.4 MG PO CAPS: 0.4000 mg | ORAL_CAPSULE | Freq: Every day | ORAL | 3 refills | Status: DC

## 2018-03-17 NOTE — Progress Notes (Signed)
   03/17/2018 4:05 PM   Joshua Lang [birth year] [id number]  Reason for visit: Follow up BPH, prostatitis   HPI: I saw Joshua Lang today in follow-up for history of prostatitis.  He is a 62 year old male who is previously followed by Dr. Bernardo Heater.  He has mild urinary symptoms that are well managed on Flomax.  He does have a history of prostate cancer in his father.  He has mildly bothersome nocturia 2-3 times per night.  Notably, he does have a history of 7-8 episodes of culture documented prostatitis over the last 10 to 15 years.  Last infection was February 2019 with Klebsiella.  He denies any recent episodes of dysuria.  Denies gross hematuria.  Most recent PSA was 0.9 in October 2018.  ROS: Please see flowsheet from today's date for complete review of systems.  Physical Exam: BP (!) 150/88 (BP Location: Left Arm, Patient Position: Sitting)   Pulse 69   Ht 6\' 1"  (1.854 m)   Wt 220 lb (99.8 kg)   BMI 29.03 kg/m    Constitutional:  Alert and oriented, No acute distress. Respiratory: Normal respiratory effort, no increased work of breathing. GI: Abdomen is soft, nontender, nondistended, no abdominal masses GU: No CVA tenderness Skin: No rashes, bruises or suspicious lesions. Neurologic: Grossly intact, no focal deficits, moving all 4 extremities. Psychiatric: Normal mood and affect  Laboratory Data: PSA 02/2017: 0.9  Pertinent Imaging: None to review  Assessment & Plan:   In summary, Joshua Lang is a 62 year old male with history of prostate cancer in his father and mild urinary symptoms well controlled on Flomax.  He also has a history of recurrent episodes of prostatitis, with 7-8 episodes over the last 15 or so years.  We discussed that typically in an otherwise healthy male we would recommend further work-up of UTI with CT urogram and cystoscopy.  He is undergoing a rotator cuff surgery in the coming weeks and he would like to defer further work-up at this time.  RTC in 6  months with PVR Consider cystoscopy and CTU if ongoing recurrent episodes of prostatitis  Continue yearly PSA screening Continue flomax  Billey Co, MD  [facility] [address] [place], [place] [address] [phone]

## 2018-03-21 MED ORDER — CEFAZOLIN SODIUM-DEXTROSE 2-4 GM/100ML-% IV SOLN
2.0000 g | Freq: Once | INTRAVENOUS | Status: AC
  Administered 2018-03-22: 2 g via INTRAVENOUS

## 2018-03-22 ENCOUNTER — Encounter
Admission: RE | Disposition: A | Discharge status: Home / Self Care | Payer: Self-pay | Source: Ambulatory Visit | Attending: Surgery

## 2018-03-22 ENCOUNTER — Ambulatory Visit: Payer: BC Managed Care – PPO | Admitting: Anesthesiology

## 2018-03-22 ENCOUNTER — Ambulatory Visit
Admission: RE | Admit: 2018-03-22 | Discharge: 2018-03-22 | Disposition: A | Discharge status: Home / Self Care | Payer: BC Managed Care – PPO | Source: Ambulatory Visit | Attending: Surgery | Admitting: Surgery

## 2018-03-22 ENCOUNTER — Encounter: Payer: Self-pay | Admitting: Anesthesiology

## 2018-03-22 ENCOUNTER — Other Ambulatory Visit: Payer: Self-pay

## 2018-03-22 DIAGNOSIS — Y9301 Activity, walking, marching and hiking: Secondary | ICD-10-CM | POA: Diagnosis not present

## 2018-03-22 DIAGNOSIS — E7800 Pure hypercholesterolemia, unspecified: Secondary | ICD-10-CM | POA: Insufficient documentation

## 2018-03-22 DIAGNOSIS — N400 Enlarged prostate without lower urinary tract symptoms: Secondary | ICD-10-CM | POA: Insufficient documentation

## 2018-03-22 DIAGNOSIS — Y9289 Other specified places as the place of occurrence of the external cause: Secondary | ICD-10-CM | POA: Insufficient documentation

## 2018-03-22 DIAGNOSIS — Z7982 Long term (current) use of aspirin: Secondary | ICD-10-CM | POA: Diagnosis not present

## 2018-03-22 DIAGNOSIS — S46011A Strain of muscle(s) and tendon(s) of the rotator cuff of right shoulder, initial encounter: Secondary | ICD-10-CM | POA: Insufficient documentation

## 2018-03-22 DIAGNOSIS — M7521 Bicipital tendinitis, right shoulder: Secondary | ICD-10-CM | POA: Diagnosis not present

## 2018-03-22 DIAGNOSIS — Z79899 Other long term (current) drug therapy: Secondary | ICD-10-CM | POA: Insufficient documentation

## 2018-03-22 DIAGNOSIS — W010XXA Fall on same level from slipping, tripping and stumbling without subsequent striking against object, initial encounter: Secondary | ICD-10-CM | POA: Insufficient documentation

## 2018-03-22 HISTORY — PX: SHOULDER ARTHROSCOPY WITH OPEN ROTATOR CUFF REPAIR: SHX6092

## 2018-03-22 SURGERY — ARTHROSCOPY, SHOULDER WITH REPAIR, ROTATOR CUFF, OPEN
Anesthesia: General | Site: Shoulder | Laterality: Right

## 2018-03-22 MED ORDER — PROMETHAZINE HCL 25 MG/ML IJ SOLN
6.2500 mg | Freq: Once | INTRAMUSCULAR | Status: AC
  Administered 2018-03-22: 6.25 mg via INTRAVENOUS

## 2018-03-22 MED ORDER — ROCURONIUM BROMIDE 50 MG/5ML IV SOLN
INTRAVENOUS | Status: AC
  Filled 2018-03-22: qty 2

## 2018-03-22 MED ORDER — PROMETHAZINE HCL 25 MG/ML IJ SOLN
INTRAMUSCULAR | Status: AC
  Administered 2018-03-22: 6.25 mg via INTRAVENOUS
  Filled 2018-03-22: qty 1

## 2018-03-22 MED ORDER — LIDOCAINE HCL (PF) 2 % IJ SOLN
INTRAMUSCULAR | Status: AC
  Filled 2018-03-22: qty 10

## 2018-03-22 MED ORDER — EPINEPHRINE PF 1 MG/ML IJ SOLN
INTRAMUSCULAR | Status: AC
  Filled 2018-03-22: qty 2

## 2018-03-22 MED ORDER — FENTANYL CITRATE (PF) 100 MCG/2ML IJ SOLN
INTRAMUSCULAR | Status: AC
  Administered 2018-03-22: 50 ug via INTRAVENOUS
  Filled 2018-03-22: qty 2

## 2018-03-22 MED ORDER — SODIUM CHLORIDE FLUSH 0.9 % IV SOLN
INTRAVENOUS | Status: AC
  Filled 2018-03-22: qty 10

## 2018-03-22 MED ORDER — BUPIVACAINE-EPINEPHRINE (PF) 0.5% -1:200000 IJ SOLN
INTRAMUSCULAR | Status: AC
  Filled 2018-03-22: qty 30

## 2018-03-22 MED ORDER — LIDOCAINE HCL (PF) 1 % IJ SOLN
INTRAMUSCULAR | Status: AC
  Filled 2018-03-22: qty 5

## 2018-03-22 MED ORDER — MIDAZOLAM HCL 2 MG/2ML IJ SOLN
1.0000 mg | Freq: Once | INTRAMUSCULAR | Status: AC
  Administered 2018-03-22: 1 mg via INTRAVENOUS

## 2018-03-22 MED ORDER — FENTANYL CITRATE (PF) 100 MCG/2ML IJ SOLN
50.0000 ug | Freq: Once | INTRAMUSCULAR | Status: AC
  Administered 2018-03-22: 50 ug via INTRAVENOUS

## 2018-03-22 MED ORDER — FENTANYL CITRATE (PF) 250 MCG/5ML IJ SOLN
INTRAMUSCULAR | Status: AC
  Filled 2018-03-22: qty 5

## 2018-03-22 MED ORDER — SUGAMMADEX SODIUM 200 MG/2ML IV SOLN
INTRAVENOUS | Status: AC
  Filled 2018-03-22: qty 2

## 2018-03-22 MED ORDER — SUGAMMADEX SODIUM 200 MG/2ML IV SOLN
INTRAVENOUS | Status: DC | PRN
  Administered 2018-03-22: 200 mg via INTRAVENOUS

## 2018-03-22 MED ORDER — PHENYLEPHRINE HCL 10 MG/ML IJ SOLN
INTRAVENOUS | Status: DC | PRN
  Administered 2018-03-22: 30 ug/min via INTRAVENOUS

## 2018-03-22 MED ORDER — CEFAZOLIN SODIUM-DEXTROSE 2-4 GM/100ML-% IV SOLN
INTRAVENOUS | Status: AC
  Filled 2018-03-22: qty 100

## 2018-03-22 MED ORDER — MIDAZOLAM HCL 2 MG/2ML IJ SOLN
INTRAMUSCULAR | Status: AC
  Administered 2018-03-22: 1 mg via INTRAVENOUS
  Filled 2018-03-22: qty 2

## 2018-03-22 MED ORDER — LACTATED RINGERS IV SOLN
INTRAVENOUS | Status: DC
  Administered 2018-03-22 (×2): via INTRAVENOUS

## 2018-03-22 MED ORDER — BUPIVACAINE HCL (PF) 0.5 % IJ SOLN
INTRAMUSCULAR | Status: AC
  Filled 2018-03-22: qty 10

## 2018-03-22 MED ORDER — PROPOFOL 10 MG/ML IV BOLUS
INTRAVENOUS | Status: DC | PRN
  Administered 2018-03-22: 170 mg via INTRAVENOUS
  Administered 2018-03-22: 30 mg via INTRAVENOUS

## 2018-03-22 MED ORDER — BUPIVACAINE LIPOSOME 1.3 % IJ SUSP
INTRAMUSCULAR | Status: AC
  Filled 2018-03-22: qty 20

## 2018-03-22 MED ORDER — ONDANSETRON HCL 4 MG/2ML IJ SOLN
INTRAMUSCULAR | Status: DC | PRN
  Administered 2018-03-22: 4 mg via INTRAVENOUS

## 2018-03-22 MED ORDER — BUPIVACAINE LIPOSOME 1.3 % IJ SUSP
INTRAMUSCULAR | Status: DC | PRN
  Administered 2018-03-22: 7 mL via PERINEURAL
  Administered 2018-03-22: 13 mL via PERINEURAL

## 2018-03-22 MED ORDER — LACTATED RINGERS IV SOLN
INTRAVENOUS | Status: DC | PRN
  Administered 2018-03-22: 1 mL

## 2018-03-22 MED ORDER — BUPIVACAINE-EPINEPHRINE 0.5% -1:200000 IJ SOLN
INTRAMUSCULAR | Status: DC | PRN
  Administered 2018-03-22: 30 mL

## 2018-03-22 MED ORDER — FENTANYL CITRATE (PF) 100 MCG/2ML IJ SOLN
INTRAMUSCULAR | Status: DC | PRN
  Administered 2018-03-22: 75 ug via INTRAVENOUS
  Administered 2018-03-22: 25 ug via INTRAVENOUS
  Administered 2018-03-22: 50 ug via INTRAVENOUS
  Administered 2018-03-22: 100 ug via INTRAVENOUS

## 2018-03-22 MED ORDER — DEXAMETHASONE SODIUM PHOSPHATE 4 MG/ML IJ SOLN
INTRAMUSCULAR | Status: DC | PRN
  Administered 2018-03-22: 5 mg via INTRAVENOUS

## 2018-03-22 MED ORDER — LIDOCAINE HCL (CARDIAC) PF 100 MG/5ML IV SOSY
PREFILLED_SYRINGE | INTRAVENOUS | Status: DC | PRN
  Administered 2018-03-22: 100 mg via INTRAVENOUS

## 2018-03-22 MED ORDER — ROCURONIUM BROMIDE 100 MG/10ML IV SOLN
INTRAVENOUS | Status: DC | PRN
  Administered 2018-03-22: 80 mg via INTRAVENOUS

## 2018-03-22 MED ORDER — BUPIVACAINE HCL (PF) 0.5 % IJ SOLN
INTRAMUSCULAR | Status: DC | PRN
  Administered 2018-03-22: 7 mL via PERINEURAL
  Administered 2018-03-22: 3 mL via PERINEURAL

## 2018-03-22 MED ORDER — FENTANYL CITRATE (PF) 100 MCG/2ML IJ SOLN: 25.0000 ug | INTRAMUSCULAR | Status: DC | PRN

## 2018-03-22 MED ORDER — DEXMEDETOMIDINE HCL 200 MCG/2ML IV SOLN
INTRAVENOUS | Status: DC | PRN
  Administered 2018-03-22: 8 ug via INTRAVENOUS

## 2018-03-22 SURGICAL SUPPLY — 47 items
ANCHOR JUGGERKNOT WTAP NDL 2.9 (Anchor) ×8 IMPLANT
ANCHOR SUT QUATTRO KNTLS 4.5 (Anchor) ×2 IMPLANT
BIT DRILL JUGRKNT W/NDL BIT2.9 (DRILL) IMPLANT
BLADE FULL RADIUS 3.5 (BLADE) ×1 IMPLANT
BUR ACROMIONIZER 4.0 (BURR) ×1 IMPLANT
BUR OVAL 4.0 (BURR) ×2 IMPLANT
CANNULA SHAVER 8MMX76MM (CANNULA) ×3 IMPLANT
CHLORAPREP W/TINT 26ML (MISCELLANEOUS) ×3 IMPLANT
COVER MAYO STAND STRL (DRAPES) ×3 IMPLANT
COVER WAND RF STERILE (DRAPES) ×1 IMPLANT
DRAPE IMP U-DRAPE 54X76 (DRAPES) ×6 IMPLANT
DRILL JUGGERKNOT W/NDL BIT 2.9 (DRILL) ×3
ELECT REM PT RETURN 9FT ADLT (ELECTROSURGICAL) ×3
ELECTRODE REM PT RTRN 9FT ADLT (ELECTROSURGICAL) ×1 IMPLANT
FRR STEALTH ×2 IMPLANT
GAUZE PETRO XEROFOAM 1X8 (MISCELLANEOUS) ×3 IMPLANT
GAUZE SPONGE 4X4 12PLY STRL (GAUZE/BANDAGES/DRESSINGS) ×3 IMPLANT
GLOVE BIO SURGEON STRL SZ7.5 (GLOVE) ×6 IMPLANT
GLOVE BIO SURGEON STRL SZ8 (GLOVE) ×6 IMPLANT
GLOVE BIOGEL PI IND STRL 8 (GLOVE) ×1 IMPLANT
GLOVE BIOGEL PI INDICATOR 8 (GLOVE) ×2
GLOVE INDICATOR 8.0 STRL GRN (GLOVE) ×3 IMPLANT
GOWN STRL REUS W/ TWL LRG LVL3 (GOWN DISPOSABLE) ×1 IMPLANT
GOWN STRL REUS W/ TWL XL LVL3 (GOWN DISPOSABLE) ×1 IMPLANT
GOWN STRL REUS W/TWL LRG LVL3 (GOWN DISPOSABLE) ×2
GOWN STRL REUS W/TWL XL LVL3 (GOWN DISPOSABLE) ×2
GRASPER SUT 15 45D LOW PRO (SUTURE) IMPLANT
IV LACTATED RINGER IRRG 3000ML (IV SOLUTION) ×4
IV LR IRRIG 3000ML ARTHROMATIC (IV SOLUTION) ×2 IMPLANT
MANIFOLD NEPTUNE II (INSTRUMENTS) ×3 IMPLANT
MASK FACE SPIDER DISP (MASK) ×3 IMPLANT
MAT ABSORB  FLUID 56X50 GRAY (MISCELLANEOUS) ×2
MAT ABSORB FLUID 56X50 GRAY (MISCELLANEOUS) ×1 IMPLANT
PACK ARTHROSCOPY SHOULDER (MISCELLANEOUS) ×3 IMPLANT
PROBE BIPOLAR ATHRO 135MM 90D (MISCELLANEOUS) ×2 IMPLANT
SLING ARM LRG DEEP (SOFTGOODS) ×1 IMPLANT
SLING ULTRA II LG (MISCELLANEOUS) ×3 IMPLANT
STAPLER SKIN PROX 35W (STAPLE) ×3 IMPLANT
STRAP SAFETY 5IN WIDE (MISCELLANEOUS) ×3 IMPLANT
SUT ETHIBOND 0 MO6 C/R (SUTURE) ×3 IMPLANT
SUT VIC AB 2-0 CT1 27 (SUTURE) ×4
SUT VIC AB 2-0 CT1 TAPERPNT 27 (SUTURE) ×2 IMPLANT
TAPE MICROFOAM 4IN (TAPE) ×3 IMPLANT
TUBING ARTHRO INFLOW-ONLY STRL (TUBING) ×3 IMPLANT
TUBING CONNECTING 10 (TUBING) ×2 IMPLANT
TUBING CONNECTING 10' (TUBING) ×1
WAND HAND CNTRL MULTIVAC 90 (MISCELLANEOUS) ×1 IMPLANT

## 2018-03-22 NOTE — Anesthesia Post-op Follow-up Note (Signed)
Anesthesia QCDR form completed.        

## 2018-03-22 NOTE — Discharge Instructions (Addendum)
Orthopedic discharge instructions: Keep dressing dry and intact.  May sponge bathe after dressing changed on post-op day #4 (Saturday).  Cover staples with Band-Aids after drying off. Apply ice frequently to shoulder. Take ibuprofen 800 mg TID with meals for 7-10 days, then as necessary. Take oxycodone as prescribed when needed.  May supplement with ES Tylenol if necessary. Keep shoulder immobilizer on at all times except may remove for bathing purposes. Follow-up in 10-14 days or as scheduled.  AMBULATORY SURGERY  DISCHARGE INSTRUCTIONS   1) The drugs that you were given will stay in your system until tomorrow so for the next 24 hours you should not:  A) Drive an automobile B) Make any legal decisions C) Drink any alcoholic beverage   2) You may resume regular meals tomorrow.  Today it is better to start with liquids and gradually work up to solid foods.  You may eat anything you prefer, but it is better to start with liquids, then soup and crackers, and gradually work up to solid foods.   3) Please notify your doctor immediately if you have any unusual bleeding, trouble breathing, redness and pain at the surgery site, drainage, fever, or pain not relieved by medication.    4) Additional Instructions:        Please contact your physician with any problems or Same Day Surgery at [phone], Monday through Friday 6 am to 4 pm, or [place] at [facility] number at [phone].

## 2018-03-22 NOTE — Transfer of Care (Signed)
Immediate Anesthesia Transfer of Care Note  Patient: Joshua Lang  Procedure(s) Performed: SHOULDER ARTHROSCOPY WITH OPEN ROTATOR CUFF REPAIR (Right Shoulder)  Patient Location: PACU  Anesthesia Type:General  Level of Consciousness: awake, alert , oriented and patient cooperative  Airway & Oxygen Therapy: Patient Spontanous Breathing and Patient connected to nasal cannula oxygen  Post-op Assessment: Report given to RN and Post -op Vital signs reviewed and stable  Post vital signs: Reviewed and stable  Last Vitals:  Vitals Value Taken Time  BP    Temp    Pulse    Resp    SpO2      Last Pain:  Vitals:   03/22/18 0926  TempSrc: Tympanic  PainSc: 0-No pain         Complications: No apparent anesthesia complications

## 2018-03-22 NOTE — Op Note (Signed)
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  Post-Op Diagnosis:   Same.  Procedure:   Mid arthroscopic debridement, arthroscopic subacromial decompression, mini-open repair of massive rotator cuff tear, and mini-open biceps tenodesis, right shoulder.  Anesthesia:   General endotracheal with interscalene block using Exparel placed preoperatively by the anesthesiologist.  Surgeon:   Pascal Lux, MD  Assistant:   Cameron Proud, PA-C; Phoebe Sharps, PA-S  Findings:   As above. The entire supraspinatus, infraspinatus, and superior portion of the teres minor all were torn and were retracted back beyond the glenoid rim. There was some focal articular sided partial-thickness fraying of the appear insertional fibers of the scapularis tendon involving less than 10% of the footprint. The biceps tendon demonstrated evidence of mild fraying but otherwise was intact. The labrum was intact, as were the articular surfaces of the glenoid and humerus.  Complications:   None  Fluids:   1200 cc  Estimated blood loss:   10 cc  Tourniquet time:   None  Drains:   None  Closure:   Staples      Brief clinical note:   The patient is a 62 year old male who is 4 to 5 years status post a rotator cuff repair by Dr. [name].  The patient had done well following the surgery until he apparently tripped and fell several months ago, reinjuring his shoulder.  Subsequent work-up included an MRI scan which demonstrated a massive rotator cuff tear with retraction of the tendon medial to the glenoid rim. The patient presents at this time for definitive management of these shoulder symptoms.  Procedure:   The patient underwent placement of an interscalene block by the anesthesiologist in the preoperative holding area using Exparel before being brought into the operating room and lain in the supine position. The  patient then underwent general endotracheal intubation and anesthesia before being repositioned in the beach chair position using the beach chair positioner. The right shoulder and upper extremity were prepped with ChloraPrep solution before being draped sterilely. Preoperative antibiotics were administered. A timeout was performed to confirm the proper surgical site before the expected portal sites and incision site were injected with 0.5% Sensorcaine with epinephrine. A posterior portal was created and the glenohumeral joint thoroughly inspected with the findings as described above. It was felt that nothing needed to be done intra-articularly through the anterior portal, so no anterior portal was created.   The camera was repositioned through the posterior portal into the subacromial space. A separate lateral portal was created using an outside-in technique. The 3.5 mm full-radius resector was introduced and used to perform a subtotal bursectomy. The ArthroCare wand was then inserted and used to remove the periosteal tissue off the undersurface of the anterior third of the acromion as well as to recess the coracoacromial ligament from its attachment along the anterior and lateral margins of the acromion. There was no anterior osteophyte as the patient had undergone a prior bony acromioplasty, so no additional acromioplasty was deemed necessary. The instruments were removed from the joint after suctioning the excess fluid.  Incorporating the patient's prior surgical incision, an approximately 4-5 cm incision was made over the anterolateral aspect of the shoulder beginning at the anterolateral corner of the acromion and extending distally in line with the bicipital groove. This incision was carried down through the subcutaneous tissues to expose the deltoid fascia. The raphae between  the anterior and middle thirds was identified and this plane developed to provide access into the subacromial space. Additional  bursal tissues were debrided sharply using Metzenbaum scissors. The rotator cuff tear was readily identified.  It appeared that the supraspinatus tendon had torn previously and was retracted chronically. However, the infraspinatus/teres minor tendon tears appeared to have occurred more recently and probably constituted the injury the patient experienced several months ago. The posterior portion of the rotator cuff was mobilized carefully. The tissue was appear poor quality but could be mobilized anteriorly and laterally sufficiently to effect a partial repair. This tissue was repaired back to the greater tuberosity using two Biomet 2.9 mm JuggerKnot anchors.   The bicipital groove was identified by palpation and opened for 1-1.5 cm. The biceps tendon was retrieved through this defect. The floor of the bicipital groove was roughened with a curet before another Biomet 2.9 mm JuggerKnot anchor was inserted. Both sets of sutures were passed through the biceps tendon and tied securely to effect the tenodesis.  The biceps tendon was then transected just proximal to this tenodesis site before the bicipital sheath was reapproximated using two #0 Ethibond interrupted sutures, incorporating the biceps tendon to further reinforce the tenodesis.  The biceps tendon stump was retrieved through the rotator cuff defect, leaving it attached to the superior portion of the glenoid.  Using a fourth Biomet 2.9 mm JuggerKnot anchor, the biceps was then secured to the greater tuberosity at the anterior insertion site of the supraspinatus tendon to effect a superior capsular reconstruction equivalent.  The anterior portion of the posterior rotator cuff tissue flap was secured to the posterior margin of the biceps tendon before several additional #0 Ethibond interrupted sutures were placed in a side-to-side fashion more medially to complete the repair.  This enabled a complete closure of the rotator cuff defect.  The more anterior of  the JuggerKnot sutures were then brought back laterally and secured using a single [place] QuatroLink anchors to create a two-layer closure. An apparent watertight closure was obtained.  The wound was copiously irrigated with sterile saline solution before the deltoid raphae was reapproximated using 2-0 Vicryl interrupted sutures. The subcutaneous tissues were closed in two layers using 2-0 Vicryl interrupted sutures before the skin was closed using staples. The portal sites also were closed using staples. A sterile bulky dressing was applied to the shoulder before the arm was placed into a shoulder immobilizer. The patient was then awakened, extubated, and returned to the recovery room in satisfactory condition after tolerating the procedure well.

## 2018-03-22 NOTE — H&P (Signed)
Paper H&P to be scanned into permanent record. H&P reviewed and patient re-examined. No changes. 

## 2018-03-22 NOTE — Anesthesia Preprocedure Evaluation (Addendum)
Anesthesia Evaluation  Patient identified by MRN, date of birth, ID band Patient awake    Reviewed: Allergy & Precautions, H&P , NPO status , Patient's Chart, lab work & pertinent test results  History of Anesthesia Complications (+) history of anesthetic complications  Airway Mallampati: III  TM Distance: <3 FB Neck ROM: full    Dental  (+) Chipped, Poor Dentition   Pulmonary sleep apnea ,           Cardiovascular Exercise Tolerance: Good (-) angina(-) Past MI and (-) DOE negative cardio ROS       Neuro/Psych negative neurological ROS  negative psych ROS   GI/Hepatic Neg liver ROS, GERD  Medicated and Controlled,  Endo/Other  negative endocrine ROS  Renal/GU Renal disease     Musculoskeletal  (+) Arthritis ,   Abdominal   Peds  Hematology negative hematology ROS (+)   Anesthesia Other Findings Past Medical History: No date: Allergic rhinitis No date: Arthritis     Comment:  severe R > L hip No date: Colonic polyp No date: Complication of anesthesia     Comment:  WITH 1ST HERNIA SURGERY PT STATES HE WAS CHOKING WITH               TUBE IN No date: ED (erectile dysfunction) No date: Enlarged prostate     Comment:  Moderately No date: GERD (gastroesophageal reflux disease) No date: History of kidney stones     Comment:  H/O No date: Hypercholesteremia No date: Prostate infection     Comment:  STARTED CIPRO PER UROLOGIST ON 01-28-16 AND IS TO               CONTINUE FOR 6 WEEKS  Past Surgical History: 2015: COLONOSCOPY 1990's: HERNIA REPAIR; Left     Comment:  X2 03/24/2016: INGUINAL HERNIA REPAIR; Right     Comment:  Procedure: HERNIA REPAIR INGUINAL ADULT;  Surgeon:               Robert Bellow, MD;  Location: [facility] ORS;  Service:               General;  Laterality: Right; No date: JOINT REPLACEMENT; Bilateral     Comment:  THR 2014: SHOULDER OPEN ROTATOR CUFF REPAIR     Comment:  Hooten,  partial RTC tear with probably SAD, possible DCE 2009: SHOULDER SURGERY     Comment:  open, 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, Clair Gulling Hooten) 09/2009: TOTAL HIP ARTHROPLASTY     Comment:  (Hooten) 05/20/2015: TOTAL HIP ARTHROPLASTY; Left     Comment:  Procedure: TOTAL HIP ARTHROPLASTY;  Surgeon: Dereck Leep, MD;  Location: [facility] ORS;  Service: Orthopedics;                Laterality: Left;  BMI    Body Mass Index:  29.03 kg/m      Reproductive/Obstetrics negative OB ROS                            Anesthesia Physical Anesthesia Plan  ASA: III  Anesthesia Plan: General ETT   Post-op Pain Management: GA combined w/ Regional for post-op pain   Induction: Intravenous  PONV Risk Score and Plan: Ondansetron, Dexamethasone, Midazolam and Treatment may vary due to age or medical condition  Airway Management Planned: Oral ETT  Additional Equipment:   Intra-op Plan:   Post-operative Plan:  Extubation in OR  Informed Consent: I have reviewed the patients History and Physical, chart, labs and discussed the procedure including the risks, benefits and alternatives for the proposed anesthesia with the patient or authorized representative who has indicated his/her understanding and acceptance.   Dental Advisory Given  Plan Discussed with: Anesthesiologist, CRNA and Surgeon  Anesthesia Plan Comments: (Patient consented for risks of anesthesia including but not limited to:  - adverse reactions to medications - damage to teeth, lips or other oral mucosa - sore throat or hoarseness - Damage to heart, brain, lungs or loss of life  Patient voiced understanding.)       Anesthesia Quick Evaluation

## 2018-03-22 NOTE — Anesthesia Procedure Notes (Addendum)
Anesthesia Regional Block: Interscalene brachial plexus block   Pre-Anesthetic Checklist: ,, timeout performed, Correct Patient, Correct Site, Correct Laterality, Correct Procedure, Correct Position, site marked, Risks and benefits discussed,  Surgical consent,  Pre-op evaluation,  At surgeon's request and post-op pain management  Laterality: Upper and Right  Prep: chloraprep       Needles:  Injection technique: Single-shot  Needle Type: Stimiplex     Needle Length: 5cm  Needle Gauge: 22     Additional Needles:   Procedures:,,,, ultrasound used (permanent image in chart),,,,  Narrative:  Start time: 03/22/2018 10:05 AM End time: 03/22/2018 10:09 AM Injection made incrementally with aspirations every 5 mL.  Performed by: Personally  Anesthesiologist: , [name], MD  Additional Notes: Functioning IV was confirmed and monitors were applied.  A 72mm 22ga Stimuplex needle was used. Sterile prep,hand hygiene and sterile gloves were used.  Minimal sedation used for procedure.  No paresthesia endorsed by patient during the procedure.  Negative aspiration and negative test dose prior to incremental administration of local anesthetic. The patient tolerated the procedure well with no immediate complications.

## 2018-03-22 NOTE — Anesthesia Procedure Notes (Signed)
Procedure Name: Intubation Date/Time: 03/22/2018 12:23 PM Performed by: Bernardo Heater, CRNA Pre-anesthesia Checklist: Patient identified, Emergency Drugs available, Suction available and Patient being monitored Patient Re-evaluated:Patient Re-evaluated prior to induction Oxygen Delivery Method: Circle system utilized Preoxygenation: Pre-oxygenation with 100% oxygen Induction Type: IV induction Ventilation: Mask ventilation without difficulty Laryngoscope Size: Mac and 3 Grade View: Grade III Tube size: 7.0 mm Number of attempts: 3 Airway Equipment and Method: Video-laryngoscopy and Bougie stylet Placement Confirmation: positive ETCO2 and breath sounds checked- equal and bilateral Secured at: 23 cm Tube secured with: Tape Dental Injury: Teeth and Oropharynx as per pre-operative assessment  Difficulty Due To: Difficulty was unanticipated and Difficult Airway- due to anterior larynx Future Recommendations: Recommend- induction with short-acting agent, and alternative techniques readily available

## 2018-03-23 ENCOUNTER — Encounter: Payer: Self-pay | Admitting: Surgery

## 2018-03-24 NOTE — Anesthesia Postprocedure Evaluation (Signed)
Anesthesia Post Note  Patient: Joshua Lang  Procedure(s) Performed: SHOULDER ARTHROSCOPY WITH OPEN ROTATOR CUFF REPAIR (Right Shoulder)  Patient location during evaluation: PACU Anesthesia Type: General Level of consciousness: awake and alert Pain management: pain level controlled Vital Signs Assessment: post-procedure vital signs reviewed and stable Respiratory status: spontaneous breathing, nonlabored ventilation, respiratory function stable and patient connected to nasal cannula oxygen Cardiovascular status: blood pressure returned to baseline and stable Postop Assessment: no apparent nausea or vomiting Anesthetic complications: no     Last Vitals:  Vitals:   03/22/18 1502 03/22/18 1550  BP: 136/84 138/76  Pulse: (!) 59 63  Resp: 16 16  Temp:    SpO2: 97% 100%    Last Pain:  Vitals:   03/23/18 0802  TempSrc:   PainSc: 0-No pain                 [name] [name]

## 2018-06-13 ENCOUNTER — Encounter: Payer: Self-pay | Admitting: Surgery

## 2018-07-15 ENCOUNTER — Other Ambulatory Visit: Payer: Self-pay | Admitting: *Deleted

## 2018-07-15 NOTE — Telephone Encounter (Signed)
Patient left a voicemail requesting a refill on fluocinonide cream.  Last office visit  09/27/17/acute Last refill 07/10/15  60 G/3 refills Upcoming appointment 07/20/2018

## 2018-07-16 MED ORDER — FLUOCINONIDE 0.05 % EX CREA: TOPICAL_CREAM | CUTANEOUS | 3 refills | Status: DC

## 2018-07-17 ENCOUNTER — Encounter: Payer: Self-pay | Admitting: Family Medicine

## 2018-07-17 NOTE — Progress Notes (Signed)
Dr. Frederico Hamman T. Artemis Koller, MD, [facility] [place], [address] Phone: [phone] Fax: [phone]  07/20/2018  Patient: Joshua Lang, MRN: [id number], DOB: 10/12/1955, 63 y.o.  Primary Physician:  Owens Loffler, MD   Chief Complaint  Patient presents with  . Annual Exam   Subjective:   GRAVIEL Lang is a 63 y.o. pleasant patient who presents with the following:  Preventative Health Maintenance Visit:  Health Maintenance Summary Reviewed and updated, unless pt declines services.  Tobacco History Reviewed. Alcohol: No concerns, no excessive use Exercise Habits: Some activity, rec at least 30 mins 5 times a week - still going hiking some STD concerns: no risk or activity to increase risk Drug Use: None Encouraged self-testicular check  [name] is here for his routine physical  Colon cancer screening  S/p R shoulder arthroscopy  [place] Maintenance  Topic Date Due  . COLONOSCOPY  12/09/2017  . TETANUS/TDAP  12/31/2025  . INFLUENZA VACCINE  Completed  . Hepatitis C Screening  Completed  . HIV Screening  Completed   Immunization History  Administered Date(s) Administered  . Influenza Whole 02/08/2009, 02/07/2012  . Influenza, Seasonal, Injecte, Preservative Fre 01/08/2014  . Influenza,inj,Quad PF,6+ Mos 01/09/2015, 01/01/2016, 01/14/2017, 02/03/2018  . Pneumococcal Conjugate-13 04/23/2015  . Pneumococcal Polysaccharide-23 03/28/2016  . Td 02/13/2009  . Tdap 01/01/2016  . Zoster 06/22/2013  . Zoster Recombinat (Shingrix) 01/14/2017, 03/23/2017   Patient Active Problem List   Diagnosis Date Noted  . Benign prostatic hyperplasia without lower urinary tract symptoms 03/10/2017  . Hernia of abdominal cavity 01/23/2016  . S/P total hip arthroplasty 05/20/2015  . ALLERGIC RHINITIS 02/17/2009  . 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 UNEQUAL LEG LENGTH 02/13/2009  . Sleep apnea  02/13/2009  . COLONIC POLYPS 10/30/2008  . HYPERCHOLESTEROLEMIA 10/30/2008  . NEPHROLITHIASIS 10/30/2008  . DEGENERATIVE JOINT DISEASE, HIPS 10/30/2008   Past Medical History:  Diagnosis Date  . Allergic rhinitis   . 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 Colonic polyp   . Complication of anesthesia    WITH 1ST HERNIA SURGERY PT STATES HE WAS CHOKING WITH TUBE IN  . ED (erectile dysfunction)   . Enlarged prostate    Moderately  . GERD (gastroesophageal reflux disease)   . History of kidney stones    H/O  . Hypercholesteremia    Past Surgical History:  Procedure Laterality Date  . COLONOSCOPY  2015  . HERNIA REPAIR Left 1990's   X2  . INGUINAL HERNIA REPAIR Right 03/24/2016   Procedure: HERNIA REPAIR INGUINAL ADULT;  Surgeon: Robert Bellow, MD;  Location: [facility] ORS;  Service: General;  Laterality: Right;  . JOINT REPLACEMENT Bilateral    THR  . SHOULDER ARTHROSCOPY WITH OPEN ROTATOR CUFF REPAIR Right 03/22/2018   Procedure: SHOULDER ARTHROSCOPY WITH OPEN ROTATOR CUFF REPAIR;  Surgeon: Corky Mull, MD;  Location: [facility] ORS;  Service: Orthopedics;  Laterality: Right;  . SHOULDER OPEN ROTATOR CUFF REPAIR  2014   [place]en, partial RTC tear with probably SAD, possible DCE  . SHOULDER SURGERY  2009   open, 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, (Jim Hooten)  . TOTAL HIP ARTHROPLASTY  09/2009   (Hooten)  . TOTAL HIP ARTHROPLASTY Left 05/20/2015   Procedure: TOTAL HIP ARTHROPLASTY;  Surgeon: Dereck Leep, MD;  Location: [facility] ORS;  Service: Orthopedics;  Laterality: Left;   Social History   Socioeconomic History  . Marital status:  Single    Spouse name: Not on file  . Number of children: Not on file  . Years of education: Not on file  . Highest education level: Not on file  Occupational History  . Occupation: [employer], Environmental manager, head    Comment: Doctorate  Social Needs  . Financial resource strain: Not on file  . Food insecurity:    Worry: Not on file    Inability: Not on file  .  Transportation needs:    Medical: Not on file    Non-medical: Not on file  Tobacco Use  . Smoking status: Never Smoker  . Smokeless tobacco: Never Used  Substance and Sexual Activity  . Alcohol use: Yes    Comment: occassional beer  . Drug use: No  . Sexual activity: Not on file  Lifestyle  . Physical activity:    Days per week: Not on file    Minutes per session: Not on file  . Stress: Not on file  Relationships  . Social connections:    Talks on phone: Not on file    Gets together: Not on file    Attends religious service: Not on file    Active member of club or organization: Not on file    Attends meetings of clubs or organizations: Not on file    Relationship status: Not on file  . Intimate partner violence:    Fear of current or ex partner: Not on file    Emotionally abused: Not on file    Physically abused: Not on file    Forced sexual activity: Not on file  Other Topics Concern  . Not on file  Social History Narrative  . Not on file   Family History  Problem Relation Age of Onset  . Prostate cancer Father   . Migraines Sister    Allergies  Allergen Reactions  . Oxycodone Itching    And hyper feeling/mind races  . Terbinafine And Related     Trouble sleeping, depression & fatigue  . Tramadol Itching  . Antihistamines, Chlorpheniramine-Type Other (See Comments)    Urinary frequency/urgency.    Medication list has been reviewed and updated.   General: Denies fever, chills, sweats. No significant weight loss. Eyes: Denies blurring,significant itching ENT: Denies earache, sore throat, and hoarseness. Cardiovascular: Denies chest pains, palpitations, dyspnea on exertion Respiratory: Denies cough, dyspnea at rest,wheeezing Breast: no concerns about lumps GI: Denies nausea, vomiting, diarrhea, constipation, change in bowel habits, abdominal pain, melena, hematochezia GU: Denies penile discharge, ED, urinary flow / outflow problems. No STD  concerns. Musculoskeletal: Denies back pain, joint pain Derm: Denies rash, itching Neuro: Denies  paresthesias, frequent falls, frequent headaches Psych: Denies depression, anxiety Endocrine: Denies cold intolerance, heat intolerance, polydipsia Heme: Denies enlarged lymph nodes Allergy: No hayfever  Objective:   BP 125/66   Pulse 67   Temp 97.8 F (36.6 C) (Oral)   Ht 6' 1"  (1.854 m)   Wt 218 lb 12 oz (99.2 kg)   BMI 28.86 kg/m  Ideal Body Weight: Weight in (lb) to have BMI = 25: 189.1  No exam data present  GEN: well developed, well nourished, no acute distress Eyes: conjunctiva and lids normal, PERRLA, EOMI ENT: TM clear, nares clear, oral exam WNL Neck: supple, no lymphadenopathy, no thyromegaly, no JVD Pulm: clear to auscultation and percussion, respiratory effort normal CV: regular rate and rhythm, S1-S2, no murmur, rub or gallop, no bruits, peripheral pulses normal and symmetric, no cyanosis, clubbing, edema or varicosities GI:  soft, non-tender; no hepatosplenomegaly, masses; active bowel sounds all quadrants GU: no hernia, testicular mass, penile discharge Lymph: no cervical, axillary or inguinal adenopathy MSK: gait normal, muscle tone and strength WNL, no joint swelling, effusions, discoloration, crepitus  SKIN: clear, good turgor, color WNL, no rashes, lesions, or ulcerations Neuro: normal mental status, normal strength, sensation, and motion Psych: alert; oriented to person, place and time, normally interactive and not anxious or depressed in appearance. All labs reviewed with patient.  Lipids: Lab Results  Component Value Date   CHOL 153 07/20/2018   Lab Results  Component Value Date   HDL 54.30 07/20/2018   Lab Results  Component Value Date   LDLCALC 87 07/20/2018   Lab Results  Component Value Date   TRIG 58.0 07/20/2018   Lab Results  Component Value Date   CHOLHDL 3 07/20/2018   CBC: CBC Latest Ref Rng & Units 07/20/2018 07/14/2017 07/13/2016   WBC 4.0 - 10.5 K/uL 4.3 4.9 4.6  Hemoglobin 13.0 - 17.0 g/dL 14.8 15.5 15.8  Hematocrit 39.0 - 52.0 % 43.5 45.3 46.9  Platelets 150.0 - 400.0 K/uL 187.0 244.0 128.7    Basic Metabolic Panel:    Component Value Date/Time   NA 140 07/20/2018 0953   K 4.2 07/20/2018 0953   CL 104 07/20/2018 0953   CO2 30 07/20/2018 0953   BUN 14 07/20/2018 0953   CREATININE 0.89 07/20/2018 0953   GLUCOSE 87 07/20/2018 0953   CALCIUM 9.2 07/20/2018 0953   Hepatic Function Latest Ref Rng & Units 07/20/2018 07/14/2017 07/13/2016  Total Protein 6.0 - 8.3 g/dL 6.9 7.9 7.3  Albumin 3.5 - 5.2 g/dL 4.4 4.4 4.5  AST 0 - 37 U/L 18 19 20   ALT 0 - 53 U/L 19 19 22   Alk Phosphatase 39 - 117 U/L 58 63 60  Total Bilirubin 0.2 - 1.2 mg/dL 0.3 0.4 0.5  Bilirubin, Direct 0.0 - 0.3 mg/dL 0.0 0.2 0.1    Lab Results  Component Value Date   HGBA1C 5.7 07/20/2018   Lab Results  Component Value Date   TSH 1.23 03/12/2010   Lab Results  Component Value Date   PSA 0.84 07/13/2016   PSA 1.48 07/10/2015   PSA 0.79 07/04/2014    Assessment and Plan:   Healthcare maintenance - Plan: CANCELED: Basic metabolic panel, CANCELED: CBC with Differential/Platelet, CANCELED: Hepatic function panel, CANCELED: Hemoglobin A1c, CANCELED: Lipid panel, CANCELED: PSA, total and free  Screening for diabetes mellitus - Plan: Hemoglobin A1c, CANCELED: Hemoglobin A1c  Routine general medical examination at a health care facility - Plan: PSA, total and free, Hemoglobin A1c, Hepatic function panel, CBC with Differential/Platelet, Basic metabolic panel, Lipid panel  HYPERCHOLESTEROLEMIA - Plan: Lipid panel  Patient Instructions  Dr. Gustavo Lah at [facility] did your last colonoscopy - call his office to see if you need a repeat    Health Maintenance Exam: The patient's preventative maintenance and recommended screening tests for an annual wellness exam were reviewed in full today. Brought up to date unless services  declined.  Counselled on the importance of diet, exercise, and its role in overall health and mortality. The patient's FH and SH was reviewed, including their home life, tobacco status, and drug and alcohol status.  Follow-up in 1 year for physical exam or additional follow-up below.  Follow-up: No follow-ups on file. Or follow-up in 1 year if not noted.  Signed,  Maud Deed. Kennidi Yoshida, MD   Allergies as of 07/20/2018  Reactions   Oxycodone Itching   And hyper feeling/mind races   Terbinafine And Related    Trouble sleeping, depression & fatigue   Tramadol Itching   Antihistamines, Chlorpheniramine-type Other (See Comments)   Urinary frequency/urgency.      Medication List       Accurate as of July 20, 2018  1:24 PM. Always use your most recent med list.        aspirin 81 MG chewable tablet Chew 81 mg by mouth at bedtime.   CALCIUM 600+D3 PO Take 1 tablet by mouth 2 (two) times daily.   cholecalciferol 1000 units tablet Commonly known as:  VITAMIN D Take 1,000 Units by mouth 2 (two) times daily.   famotidine 20 MG tablet Commonly known as:  PEPCID Take 20 mg by mouth daily as needed for heartburn or indigestion.   Fish Oil 1200 MG Caps Take 1,200 mg by mouth 2 (two) times daily.   fluocinonide cream 0.05 % Commonly known as:  LIDEX Apply bid as directed   fluticasone 50 MCG/ACT nasal spray Commonly known as:  FLONASE Place 2 sprays into both nostrils daily as needed for allergies.   KELP PO Take 1 tablet by mouth 2 (two) times daily.   Melatonin 10 MG Tabs Take 1 tablet by mouth at bedtime as needed.   multivitamin with minerals Tabs tablet Take 1 tablet by mouth 2 (two) times daily.   OVER THE COUNTER MEDICATION Take 4 tablets by mouth daily. TOTAL GREENS   polycarbophil 625 MG tablet Commonly known as:  FIBERCON Take 1,875 mg by mouth daily as needed (for digestive regularity.).   PROBIOTIC PO Take 1 capsule by mouth at bedtime.    Pumpkin Seed Oil Caps Take 1 capsule by mouth 2 (two) times daily.   RESVERATROL PO Take 500 mg by mouth 2 (two) times daily.   Saw Palmetto 450 MG Caps Take 450 mg by mouth 2 (two) times daily.   sildenafil 20 MG tablet Commonly known as:  REVATIO Take 2 to 5 tablets by mouth 30 minutes prior to intercourse.   simvastatin 40 MG tablet Commonly known as:  ZOCOR Take 1 tablet (40 mg total) by mouth at bedtime.   tamsulosin 0.4 MG Caps capsule Commonly known as:  FLOMAX Take 1 capsule (0.4 mg total) by mouth daily.   vitamin C 500 MG tablet Commonly known as:  ASCORBIC ACID Take 500 mg by mouth 2 (two) times daily.

## 2018-07-18 ENCOUNTER — Encounter: Payer: Self-pay | Admitting: *Deleted

## 2018-07-18 ENCOUNTER — Other Ambulatory Visit: Payer: Self-pay | Admitting: Family Medicine

## 2018-07-18 DIAGNOSIS — Z0000 Encounter for general adult medical examination without abnormal findings: Secondary | ICD-10-CM

## 2018-07-18 DIAGNOSIS — Z131 Encounter for screening for diabetes mellitus: Secondary | ICD-10-CM

## 2018-07-18 DIAGNOSIS — E7800 Pure hypercholesterolemia, unspecified: Secondary | ICD-10-CM

## 2018-07-20 ENCOUNTER — Ambulatory Visit (INDEPENDENT_AMBULATORY_CARE_PROVIDER_SITE_OTHER): Payer: BC Managed Care – PPO | Admitting: Family Medicine

## 2018-07-20 ENCOUNTER — Encounter: Payer: Self-pay | Admitting: Family Medicine

## 2018-07-20 ENCOUNTER — Other Ambulatory Visit: Payer: Self-pay

## 2018-07-20 VITALS — BP 125/66 | HR 67 | Temp 97.8°F | Ht 73.0 in | Wt 218.8 lb

## 2018-07-20 DIAGNOSIS — E7800 Pure hypercholesterolemia, unspecified: Secondary | ICD-10-CM

## 2018-07-20 DIAGNOSIS — Z0000 Encounter for general adult medical examination without abnormal findings: Secondary | ICD-10-CM

## 2018-07-20 DIAGNOSIS — Z131 Encounter for screening for diabetes mellitus: Secondary | ICD-10-CM | POA: Diagnosis not present

## 2018-07-20 LAB — CBC WITH DIFFERENTIAL/PLATELET
BASOS ABS: 0 10*3/uL (ref 0.0–0.1)
Basophils Relative: 0.4 % (ref 0.0–3.0)
Eosinophils Absolute: 0.1 10*3/uL (ref 0.0–0.7)
Eosinophils Relative: 2.4 % (ref 0.0–5.0)
HEMATOCRIT: 43.5 % (ref 39.0–52.0)
Hemoglobin: 14.8 g/dL (ref 13.0–17.0)
Lymphocytes Relative: 29.9 % (ref 12.0–46.0)
Lymphs Abs: 1.3 10*3/uL (ref 0.7–4.0)
MCHC: 34.1 g/dL (ref 30.0–36.0)
MCV: 90.3 fl (ref 78.0–100.0)
Monocytes Absolute: 0.5 10*3/uL (ref 0.1–1.0)
Monocytes Relative: 11.6 % (ref 3.0–12.0)
NEUTROS ABS: 2.4 10*3/uL (ref 1.4–7.7)
Neutrophils Relative %: 55.7 % (ref 43.0–77.0)
PLATELETS: 187 10*3/uL (ref 150.0–400.0)
RBC: 4.82 Mil/uL (ref 4.22–5.81)
RDW: 15 % (ref 11.5–15.5)
WBC: 4.3 10*3/uL (ref 4.0–10.5)

## 2018-07-20 LAB — LIPID PANEL
CHOLESTEROL: 153 mg/dL (ref 0–200)
HDL: 54.3 mg/dL (ref >39.00)
LDL CALC: 87 mg/dL (ref 0–99)
NonHDL: 99.07
TRIGLYCERIDES: 58 mg/dL (ref 0.0–149.0)
Total CHOL/HDL Ratio: 3
VLDL: 11.6 mg/dL (ref 0.0–40.0)

## 2018-07-20 LAB — HEPATIC FUNCTION PANEL
ALBUMIN: 4.4 g/dL (ref 3.5–5.2)
ALK PHOS: 58 U/L (ref 39–117)
ALT: 19 U/L (ref 0–53)
AST: 18 U/L (ref 0–37)
BILIRUBIN DIRECT: 0 mg/dL (ref 0.0–0.3)
TOTAL PROTEIN: 6.9 g/dL (ref 6.0–8.3)
Total Bilirubin: 0.3 mg/dL (ref 0.2–1.2)

## 2018-07-20 LAB — BASIC METABOLIC PANEL
BUN: 14 mg/dL (ref 6–23)
CHLORIDE: 104 meq/L (ref 96–112)
CO2: 30 meq/L (ref 19–32)
Calcium: 9.2 mg/dL (ref 8.4–10.5)
Creatinine, Ser: 0.89 mg/dL (ref 0.40–1.50)
GFR: 86.68 mL/min (ref >60.00)
Glucose, Bld: 87 mg/dL (ref 70–99)
POTASSIUM: 4.2 meq/L (ref 3.5–5.1)
Sodium: 140 mEq/L (ref 135–145)

## 2018-07-20 LAB — HEMOGLOBIN A1C: Hgb A1c MFr Bld: 5.7 % (ref 4.6–6.5)

## 2018-07-20 MED ORDER — FLUTICASONE PROPIONATE 50 MCG/ACT NA SUSP: 2.0000 | Freq: Every day | NASAL | 11 refills | Status: DC | PRN

## 2018-07-20 MED ORDER — TAMSULOSIN HCL 0.4 MG PO CAPS: 0.4000 mg | ORAL_CAPSULE | Freq: Every day | ORAL | 3 refills | Status: DC

## 2018-07-20 MED ORDER — SILDENAFIL CITRATE 20 MG PO TABS: ORAL_TABLET | ORAL | 11 refills | Status: DC

## 2018-07-20 NOTE — Patient Instructions (Signed)
Dr. Gustavo Lah at [facility] did your last colonoscopy - call his office to see if you need a repeat

## 2018-07-21 ENCOUNTER — Other Ambulatory Visit: Payer: Self-pay

## 2018-07-21 LAB — PSA, TOTAL AND FREE
PSA, % Free: 33 % (calc) (ref >25)
PSA, Free: 0.2 ng/mL
PSA, TOTAL: 0.6 ng/mL (ref <=4.0)

## 2018-07-21 MED ORDER — SIMVASTATIN 40 MG PO TABS: 40.0000 mg | ORAL_TABLET | Freq: Every day | ORAL | 3 refills | Status: DC

## 2018-07-21 MED ORDER — FLUOCINONIDE 0.05 % EX CREA: TOPICAL_CREAM | CUTANEOUS | 3 refills | Status: DC

## 2018-07-21 NOTE — Telephone Encounter (Signed)
Patient called states that [facility] pharmacy does not have RX for Fluocinonide cream that was sent to the pharmacy on 07/16/2018. I re sent this and Simvastatin was sent to the pharmacy also today.

## 2018-07-25 ENCOUNTER — Telehealth: Payer: Self-pay | Admitting: Family Medicine

## 2018-07-25 NOTE — Telephone Encounter (Signed)
Pt want a copy of his recent labs mailed to him.

## 2018-07-25 NOTE — Telephone Encounter (Signed)
Anastasiya mailed letter with lab results out to patient on Friday 07/22/2018.

## 2018-09-13 ENCOUNTER — Telehealth: Payer: Self-pay | Admitting: Urology

## 2018-09-13 ENCOUNTER — Ambulatory Visit: Payer: BC Managed Care – PPO | Admitting: Urology

## 2018-09-13 DIAGNOSIS — N410 Acute prostatitis: Secondary | ICD-10-CM

## 2018-09-13 NOTE — Telephone Encounter (Signed)
Patient thinks he has a prostate infection Up a lot at night to urinate The urge to urinate is strong He wants to know what he needs to do? Please advise if he can just have an ABX or if he needs to be seen?   Sharyn Lull

## 2018-09-14 ENCOUNTER — Other Ambulatory Visit: Payer: BC Managed Care – PPO

## 2018-09-14 ENCOUNTER — Other Ambulatory Visit: Payer: Self-pay

## 2018-09-14 DIAGNOSIS — N410 Acute prostatitis: Secondary | ICD-10-CM

## 2018-09-14 LAB — URINALYSIS, COMPLETE
Bilirubin, UA: NEGATIVE
Glucose, UA: NEGATIVE
Ketones, UA: NEGATIVE
Leukocytes,UA: NEGATIVE
Nitrite, UA: NEGATIVE
Protein,UA: NEGATIVE
RBC, UA: NEGATIVE
Specific Gravity, UA: 1.02 (ref 1.005–1.030)
Urobilinogen, Ur: 0.2 mg/dL (ref 0.2–1.0)
pH, UA: 6 (ref 5.0–7.5)

## 2018-09-14 LAB — MICROSCOPIC EXAMINATION
Bacteria, UA: NONE SEEN
Epithelial Cells (non renal): NONE SEEN /hpf (ref 0–10)

## 2018-09-14 MED ORDER — SULFAMETHOXAZOLE-TRIMETHOPRIM 800-160 MG PO TABS: 1.0000 | ORAL_TABLET | Freq: Two times a day (BID) | ORAL | 0 refills | Status: DC

## 2018-09-14 NOTE — Telephone Encounter (Signed)
S/W pt, he will come in to leave urine sample this afternoon.

## 2018-09-14 NOTE — Telephone Encounter (Signed)
Have him bring in a u/a. Start Bactrim DS BID x 4 weeks if any concerns on urine sample.  Keep scheduled follow up with me in 01/2019, will need CTU and cysto as previously discussed.  Nickolas Madrid, MD 09/14/2018

## 2018-09-14 NOTE — Telephone Encounter (Signed)
Left pt message to call, orders placed for UA drop off and script sent to pharmacy

## 2018-09-15 ENCOUNTER — Telehealth: Payer: Self-pay | Admitting: Urology

## 2018-09-15 NOTE — Telephone Encounter (Signed)
Per Dr. Diamantina Providence patient does not need to take abx that were sent in urine was clear . Patient should keep CYsto and CTscan follow up  Called patient to notify no answer detailed vmail left to notify perDPR

## 2018-09-15 NOTE — Telephone Encounter (Signed)
Instructed patient per Dr. Doristine Counter recommendations-patient verbalized understanding-is feeling much better.

## 2018-09-15 NOTE — Telephone Encounter (Signed)
Pt. Returning missed call. I told him urine was clear no need for Antibiotic and to keep his Cysto appointment and CT appointment . Pt. States he is feeling much better today after only taking 3 of the pills prescribed. He would like for Clinical to call him at [phone] to discuss his urine culture in more detail and answer questions.

## 2018-09-16 LAB — CULTURE, URINE COMPREHENSIVE

## 2018-09-21 ENCOUNTER — Telehealth: Payer: Self-pay | Admitting: Family Medicine

## 2018-09-21 NOTE — Telephone Encounter (Signed)
Best number [phone]  Would like a call a today  Tomorrow driving to [place]   Pt used to see Dr Bernardo Heater now he sees Dr Nickolas Madrid.  Pt stated he has had prostate infections in the past.  Dr Diamantina Providence wants pt to have CT Scan and Cysto  He would like to talk to Dr Lorelei Pont about this to get more information.   Offered virtual appointment pt declined.

## 2018-09-22 NOTE — Telephone Encounter (Signed)
I spoke to [facility] for about 15 minutes.  I tried to answer his questions, and he understands that I am not a urologist.  Generally, it is my understanding that a CT urogram and cystoscopy be done in my experience when there is concern for possible renal or bladder malignancy, but in this case I am not entirely clear.  I recommended that he talk to either Dr. Bernardo Heater or Dr. Diamantina Providence verbally over the telephone, or he likely can discuss this face-to-face.  I do not think that this is an urgent essential manner, and he does have an upcoming appointment with neurology within the next few months.

## 2018-09-27 ENCOUNTER — Telehealth: Payer: Self-pay | Admitting: Urology

## 2018-09-27 NOTE — Telephone Encounter (Signed)
Please set up a telephone visit anytime in the next 2 weeks, ok to overbook or add on a non-clinic day.  Nickolas Madrid, MD 09/27/2018

## 2018-09-27 NOTE — Telephone Encounter (Signed)
Pt. States he is not sure he wants to have CT or Cysto. Pt. Does not want to set up a virtual visit but would like a specific time that Dr. Diamantina Providence could call him and discuss this. I can schedule him for a Virtual/ Telephone visit on Dr. Doristine Counter Virtual day. Virst available Virtual is not until June due to upcoming holiday and provider vacation. Please Advise.

## 2018-10-04 ENCOUNTER — Telehealth (INDEPENDENT_AMBULATORY_CARE_PROVIDER_SITE_OTHER): Payer: BC Managed Care – PPO | Admitting: Urology

## 2018-10-04 ENCOUNTER — Other Ambulatory Visit: Payer: Self-pay

## 2018-10-04 ENCOUNTER — Telehealth: Payer: Self-pay | Admitting: Urology

## 2018-10-04 DIAGNOSIS — N411 Chronic prostatitis: Secondary | ICD-10-CM | POA: Diagnosis not present

## 2018-10-04 NOTE — Telephone Encounter (Signed)
-----   Message from Billey Co, MD sent at 10/04/2018  9:12 AM EDT ----- Regarding: change follow up Please cancel his cysto in July, keep September follow up for PVR and clinic visits.  Thanks Nickolas Madrid, MD 10/04/2018

## 2018-10-04 NOTE — Telephone Encounter (Signed)
done

## 2018-10-04 NOTE — Progress Notes (Signed)
Virtual Visit via Telephone Note  I connected with Joshua Lang on 10/04/18 at  9:00 AM EDT by telephone and verified that I am speaking with the correct person using two identifiers.   I discussed the limitations, risks, security and privacy concerns of performing an evaluation and management service by telephone and the availability of in person appointments. We discussed the impact of the COVID-19 on the healthcare system, and the importance of social distancing and reducing patient and provider exposure. I also discussed with the patient that there may be a patient responsible charge related to this service. The patient expressed understanding and agreed to proceed.  Reason for visit: Recurrent prostatitis  History of Present Illness: I had phone follow-up with Joshua Lang today regarding his history of recurrent prostatitis.  He has a history of 7-8 episodes of culture documented prostatitis over the last 10 to 15 years.  His last documented infection was February 2019 with Klebsiella.  There are no prior PVRs to review.  He has mild urinary symptoms well managed on Flomax.  We had previously recommended CT urogram and cystoscopy for further work-up of his recurrent infections, however he wanted to further discuss before proceeding with any further work-up.  He denies any urinary symptoms at this time.  He does have a family history of prostate cancer in his father.  He feels his prostatitis infections were always stress related, and he denies any recent issues since he has minimized his life stressors.  Assessment and Plan: 63 year old male with family history of prostate cancer and recurrent episodes of culture documented prostatitis.  Last culture proven UTI was February 2019.  We discussed further evaluation at length, including consideration of cystoscopy and CT urogram to rule out stone disease, hydronephrosis, or other bladder pathology, as well as evaluate prostate volume.  He would like to  defer at this time, and he does not think he could tolerate an awake cystoscopy.  We discussed the risks of deferring further evaluation at this time, and he understands these risks.  Follow Up: RTC September 2020 for PVR and PSA Consider outlet procedure in the future if elevated PVRs Any Flomax   I discussed the assessment and treatment plan with the patient. The patient was provided an opportunity to ask questions and all were answered. The patient agreed with the plan and demonstrated an understanding of the instructions.   The patient was advised to call back or seek an in-person evaluation if the symptoms worsen or if the condition fails to improve as anticipated.  I provided 15 minutes of non-face-to-face time during this encounter.   Billey Co, MD

## 2018-10-05 ENCOUNTER — Telehealth: Payer: Self-pay | Admitting: Urology

## 2018-10-05 NOTE — Telephone Encounter (Signed)
Called pt informed him of the information below. Pt gave verbal understanding and states that he began taking cranberry 2 two weeks ago and will continue with that.

## 2018-10-05 NOTE — Telephone Encounter (Signed)
Please advise. Thanks.  

## 2018-10-05 NOTE — Telephone Encounter (Signed)
Pt called and would like to know if Dr Diamantina Providence would advise him if it is ok to take Colliidal Silver and Elderberry Syrup (for prevention prostate infections) He states that if he doesn't answer to please leave him a detailed message.

## 2018-10-05 NOTE — Telephone Encounter (Signed)
None of those supplements are regulated by the FDA, so it is unknown what is actually in the bottles. There is no good data supporting the use of those supplements for prostate infections and I would not recommend their use. Cranberry tablets BID have been studied though and can reduce risk of infections about ~30%.  Nickolas Madrid, MD 10/05/2018

## 2018-10-10 ENCOUNTER — Telehealth: Payer: Self-pay

## 2018-10-10 NOTE — Telephone Encounter (Signed)
White lump on testicle for 1-3 years. About the size of a BB. White in color. Has not changed in size that he can tell. Forgot to ask last year during his CPE and was afraid to wait for March 2021. We tested his phone and his camera and microphone are blocked. Should he schedule an OV?

## 2018-10-10 NOTE — Telephone Encounter (Signed)
Yes, needs face to face evaluation for testicle check

## 2018-10-11 NOTE — Telephone Encounter (Signed)
Appointment 6/10

## 2018-10-19 ENCOUNTER — Ambulatory Visit: Payer: BC Managed Care – PPO | Admitting: Family Medicine

## 2018-10-19 ENCOUNTER — Encounter: Payer: Self-pay | Admitting: *Deleted

## 2018-10-19 ENCOUNTER — Encounter: Payer: Self-pay | Admitting: Family Medicine

## 2018-10-19 VITALS — BP 110/80 | HR 67 | Temp 98.3°F | Ht 73.0 in | Wt 217.2 lb

## 2018-10-19 DIAGNOSIS — M7741 Metatarsalgia, right foot: Secondary | ICD-10-CM | POA: Diagnosis not present

## 2018-10-19 DIAGNOSIS — N442 Benign cyst of testis: Secondary | ICD-10-CM

## 2018-10-19 NOTE — Progress Notes (Signed)
Joshua Lang T. Joshua Bertucci, MD Primary Care and [place] at [facility], [address] Phone: [phone]  FAX: [phone]  Joshua Lang - 63 y.o. male  MRN [id number]  Date of Birth: Jul 19, 1955  Visit Date: 10/19/2018  PCP: Owens Loffler, MD  Referred by: Owens Loffler, MD  Chief Complaint  Patient presents with  . Cyst on Testicle   Subjective:   Joshua Lang is a 63 y.o. very pleasant male patient who presents with the following:  Testicular cyst:  L, 2-3 years: Joshua Lang is a question regarding a testicular cyst that is been present for 2 to 3 years.  Is not tender, is not sure if it is growing.  It is outside of the testicle itself and in the scan.  He also has some metatarsal pain on the right, and previously he saw 1 of the local podiatrist who gave him what sounds to be a metatarsal bar, and this did not really help all that much.  Cyst  r dropped mt heads  Past Medical History, Surgical History, Social History, Family History, Problem List, Medications, and Allergies have been reviewed and updated if relevant.  Patient Active Problem List   Diagnosis Date Noted  . Benign prostatic hyperplasia without lower urinary tract symptoms 03/10/2017  . Hernia of abdominal cavity 01/23/2016  . S/P total hip arthroplasty 05/20/2015  . ALLERGIC RHINITIS 02/17/2009  . 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 UNEQUAL LEG LENGTH 02/13/2009  . Sleep apnea 02/13/2009  . COLONIC POLYPS 10/30/2008  . HYPERCHOLESTEROLEMIA 10/30/2008  . NEPHROLITHIASIS 10/30/2008  . DEGENERATIVE JOINT DISEASE, HIPS 10/30/2008    Past Medical History:  Diagnosis Date  . Allergic rhinitis   . 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 Colonic polyp   . Complication of anesthesia    WITH 1ST HERNIA SURGERY PT STATES HE WAS CHOKING WITH TUBE IN  . ED (erectile dysfunction)   . Enlarged prostate    Moderately  . GERD (gastroesophageal  reflux disease)   . History of kidney stones    H/O  . Hypercholesteremia     Past Surgical History:  Procedure Laterality Date  . COLONOSCOPY  2015  . HERNIA REPAIR Left 1990's   X2  . INGUINAL HERNIA REPAIR Right 03/24/2016   Procedure: HERNIA REPAIR INGUINAL ADULT;  Surgeon: Robert Bellow, MD;  Location: [facility] ORS;  Service: General;  Laterality: Right;  . JOINT REPLACEMENT Bilateral    THR  . SHOULDER ARTHROSCOPY WITH OPEN ROTATOR CUFF REPAIR Right 03/22/2018   Procedure: SHOULDER ARTHROSCOPY WITH OPEN ROTATOR CUFF REPAIR;  Surgeon: Corky Mull, MD;  Location: [facility] ORS;  Service: Orthopedics;  Laterality: Right;  . SHOULDER OPEN ROTATOR CUFF REPAIR  2014   [place], partial RTC tear with probably SAD, possible DCE  . SHOULDER SURGERY  2009   open, 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, ([facility])  . TOTAL HIP ARTHROPLASTY  09/2009   ([place])  . TOTAL HIP ARTHROPLASTY Left 05/20/2015   Procedure: TOTAL HIP ARTHROPLASTY;  Surgeon: Dereck Leep, MD;  Location: [facility] ORS;  Service: Orthopedics;  Laterality: Left;    Social History   Socioeconomic History  . Marital status: Single    Spouse name: Not on file  . Number of children: Not on file  . Years of education: Not on file  . Highest education level: Not on file  Occupational History  . Occupation: [employer], Environmental manager, head  Comment: Doctorate  Social Needs  . Financial resource strain: Not on file  . Food insecurity:    Worry: Not on file    Inability: Not on file  . Transportation needs:    Medical: Not on file    Non-medical: Not on file  Tobacco Use  . Smoking status: Never Smoker  . Smokeless tobacco: Never Used  Substance and Sexual Activity  . Alcohol use: Yes    Comment: occassional beer  . Drug use: No  . Sexual activity: Not on file  Lifestyle  . Physical activity:    Days per week: Not on file    Minutes per session: Not on file  . Stress: Not on file  Relationships  . Social connections:    Talks on  phone: Not on file    Gets together: Not on file    Attends religious service: Not on file    Active member of club or organization: Not on file    Attends meetings of clubs or organizations: Not on file    Relationship status: Not on file  . Intimate partner violence:    Fear of current or ex partner: Not on file    Emotionally abused: Not on file    Physically abused: Not on file    Forced sexual activity: Not on file  Other Topics Concern  . Not on file  Social History Narrative  . Not on file    Family History  Problem Relation Age of Onset  . Prostate cancer Father   . Migraines Sister     Allergies  Allergen Reactions  . Oxycodone Itching    And hyper feeling/mind races  . Terbinafine And Related     Trouble sleeping, depression & fatigue  . Tramadol Itching  . Antihistamines, Chlorpheniramine-Type Other (See Comments)    Urinary frequency/urgency.    Medication list reviewed and updated in full in [place].   GEN: No acute illnesses, no fevers, chills. GI: No n/v/d, eating normally Pulm: No SOB Interactive and getting along well at home.  Otherwise, ROS is as per the HPI.  Objective:   BP 110/80   Pulse 67   Temp 98.3 F (36.8 C) (Oral)   Ht 6\' 1"  (1.854 m)   Wt 217 lb 4 oz (98.5 kg)   BMI 28.66 kg/m   GEN: WDWN, NAD, Non-toxic, A & O x 3 HEENT: Atraumatic, Normocephalic. Neck supple. No masses, No LAD. Ears and Nose: No external deformity. EXTR: No c/c/e NEURO Normal gait.  PSYCH: Normally interactive. Conversant. Not depressed or anxious appearing.  Calm demeanor.   GU:, Hard testicular cyst in the skin, nowhere near the testicle or the epididymis.  This is approximately 3 mm across.  Right foot examination, the patient does have dropped metatarsal heads with bunionette formation and toe splaying.   Laboratory and Imaging Data:  Assessment and Plan:   Testicular cyst  Metatarsalgia of right foot  Reassurance on testicular  cyst, completely outside of the testicle.  Insole with metatarsal support  Patient Instructions  Insoles with metatarsal support  Off and Running / Fleet feet - across the street from [employer] on [address] on [address]    Follow-up: No follow-ups on file.  No orders of the defined types were placed in this encounter.  No orders of the defined types were placed in this encounter.   Signed,  Maud Deed. Gene Colee, MD   Outpatient Encounter Medications as of 10/19/2018  Medication Sig  . aspirin 81 MG chewable tablet Chew 81 mg by mouth at bedtime.   . Calcium Carb-Cholecalciferol (CALCIUM 600+D3 PO) Take 1 tablet by mouth 2 (two) times daily.  . cholecalciferol (VITAMIN D) 1000 UNITS tablet Take 1,000 Units by mouth 2 (two) times daily.   Marland Kitchen CRANBERRY PO Take 1 tablet by mouth 2 (two) times a day.  . famotidine (PEPCID) 20 MG tablet Take 20 mg by mouth daily as needed for heartburn or indigestion.  . fluocinonide cream (LIDEX) 0.05 % Apply bid as directed  . fluticasone (FLONASE) 50 MCG/ACT nasal spray Place 2 sprays into both nostrils daily as needed for allergies.  . Iodine, Kelp, (KELP PO) Take 1 tablet by mouth 2 (two) times daily.  . Melatonin 10 MG TABS Take 1 tablet by mouth at bedtime as needed.  . Misc Natural Products (PUMPKIN SEED OIL) CAPS Take 1 capsule by mouth 2 (two) times daily.  . Multiple Vitamin (MULTIVITAMIN WITH MINERALS) TABS tablet Take 1 tablet by mouth 2 (two) times daily.  . Omega-3 Fatty Acids (FISH OIL) 1200 MG CAPS Take 1,200 mg by mouth 2 (two) times daily.  Marland Kitchen OVER THE COUNTER MEDICATION Take 4 tablets by mouth daily. TOTAL GREENS  . polycarbophil (FIBERCON) 625 MG tablet Take 1,875 mg by mouth daily as needed (for digestive regularity.).   Marland Kitchen Probiotic Product (PROBIOTIC PO) Take 1 capsule by mouth at bedtime.  Marland Kitchen RESVERATROL PO Take 500 mg by mouth 2 (two) times daily.   . Saw Palmetto 450 MG CAPS Take 450 mg by mouth 2 (two) times  daily.   . sildenafil (REVATIO) 20 MG tablet Take 2 to 5 tablets by mouth 30 minutes prior to intercourse.  . simvastatin (ZOCOR) 40 MG tablet Take 1 tablet (40 mg total) by mouth at bedtime.  . tamsulosin (FLOMAX) 0.4 MG CAPS capsule Take 1 capsule (0.4 mg total) by mouth daily.  . vitamin C (ASCORBIC ACID) 500 MG tablet Take 500 mg by mouth 2 (two) times daily.  . [DISCONTINUED] sulfamethoxazole-trimethoprim (BACTRIM DS) 800-160 MG tablet Take 1 tablet by mouth every 12 (twelve) hours.   No facility-administered encounter medications on file as of 10/19/2018.

## 2018-10-19 NOTE — Patient Instructions (Signed)
Insoles with metatarsal support  Off and Running / Fleet feet - across the street from [employer] on [address] on [address]

## 2018-11-21 ENCOUNTER — Other Ambulatory Visit: Payer: BC Managed Care – PPO | Admitting: Urology

## 2019-01-12 ENCOUNTER — Ambulatory Visit (INDEPENDENT_AMBULATORY_CARE_PROVIDER_SITE_OTHER): Payer: BC Managed Care – PPO | Admitting: Urology

## 2019-01-12 ENCOUNTER — Encounter: Payer: Self-pay | Admitting: Urology

## 2019-01-12 ENCOUNTER — Other Ambulatory Visit: Payer: Self-pay

## 2019-01-12 VITALS — BP 128/84 | HR 76 | Ht 73.0 in | Wt 216.0 lb

## 2019-01-12 DIAGNOSIS — Z125 Encounter for screening for malignant neoplasm of prostate: Secondary | ICD-10-CM | POA: Diagnosis not present

## 2019-01-12 DIAGNOSIS — N390 Urinary tract infection, site not specified: Secondary | ICD-10-CM

## 2019-01-12 DIAGNOSIS — N400 Enlarged prostate without lower urinary tract symptoms: Secondary | ICD-10-CM | POA: Diagnosis not present

## 2019-01-12 LAB — BLADDER SCAN AMB NON-IMAGING: Scan Result: 0

## 2019-01-12 NOTE — Patient Instructions (Signed)

## 2019-01-12 NOTE — Progress Notes (Signed)
   01/12/2019 1:47 PM   Joshua Lang [birth year] ED:[id number]  Reason for visit: Follow up recurrent prostatitis, PSA screening  HPI: I saw Joshua Lang back in urology clinic for follow-up of recurrent prostatitis, PSA screening, and BPH.  He is a healthy 63 year old male with a family history of prostate cancer in his father who is had 7-8 episodes of culture documented prostatitis over the last 10 to 15 years.  His last documented infection was February 2019 with Klebsiella.  He has mild urinary symptoms well managed on Flomax.  We previously had offered cystoscopy and CT urogram for further work-up of his infections, however he deferred.  He feels strongly that his infections are stress related, now that his stressors have decreased his infections have improved.  He has been doing very well since we saw him last.  He denies any urinary symptoms or recurrent infections.  He reports he is voiding with a strong stream and continues to take Flomax.  Per our recommendations, he has started cranberry tablets twice daily and has not had any infection since starting this.  He denies any gross hematuria or flank pain.  PVR in clinic 0 mL today.   ROS: Please see flowsheet from today's date for complete review of systems.  Physical Exam: Ht 6\' 1"  (1.854 m)   BMI 28.66 kg/m    Constitutional:  Alert and oriented, No acute distress. Respiratory: Normal respiratory effort, no increased work of breathing. GI: Abdomen is soft, nontender, nondistended, no abdominal masses GU: No CVA tenderness Skin: No rashes, bruises or suspicious lesions. Neurologic: Grossly intact, no focal deficits, moving all 4 extremities. Psychiatric: Normal mood and affect  Laboratory Data:  PSA history 02/2017: 0.9 07/2016: 0.84 07/2015: 1.48 06/2014: 0.79 06/2013: 0.83 03/2012: 0.74   Assessment & Plan:   In summary, the patient is a healthy 63 year old male with multiple prior episodes of culture proven prostatitis,  last in February 2019, family history of prostate cancer in his father, and mild BPH symptoms well controlled on Flomax.  We had previously offered him CTU and cystoscopy for further evaluation of his recurrent infections, however he deferred.  He elected for conservative approach with cranberry prophylaxis twice daily, and he has had no urinary symptoms or infections since starting this medication.  PSA today, call with results RTC 1 year for BPH, PVR, routine PSA screening  A total of 25 minutes were spent face-to-face with the patient, greater than 50% was spent in patient education, counseling, and coordination of care regarding prostatitis, BPH, and PSA screening.   Billey Co, [facility] [address] [place], [place] [address] [phone]

## 2019-01-13 ENCOUNTER — Telehealth: Payer: Self-pay | Admitting: *Deleted

## 2019-01-13 LAB — PSA TOTAL (REFLEX TO FREE): Prostate Specific Ag, Serum: 0.7 ng/mL (ref 0.0–4.0)

## 2019-01-13 NOTE — Telephone Encounter (Addendum)
Left message with results-asked to call back with any questions.  ----- Message from Billey Co, MD sent at 01/13/2019  4:05 PM EDT ----- PSA stable and low at 0.7 from 0.9. Good news, keep yearly follow up as scheduled  Nickolas Madrid, MD 01/13/2019

## 2019-07-24 ENCOUNTER — Other Ambulatory Visit: Payer: Self-pay | Admitting: *Deleted

## 2019-07-24 MED ORDER — TAMSULOSIN HCL 0.4 MG PO CAPS: 0.4000 mg | ORAL_CAPSULE | Freq: Every day | ORAL | 0 refills | Status: DC

## 2019-07-24 MED ORDER — SIMVASTATIN 40 MG PO TABS: 40.0000 mg | ORAL_TABLET | Freq: Every day | ORAL | 0 refills | Status: DC

## 2019-07-24 NOTE — Addendum Note (Signed)
Addended by: Carter Kitten on: 07/24/2019 02:16 PM   Modules accepted: Orders

## 2019-07-26 ENCOUNTER — Ambulatory Visit (INDEPENDENT_AMBULATORY_CARE_PROVIDER_SITE_OTHER): Payer: BC Managed Care – PPO | Admitting: Family Medicine

## 2019-07-26 ENCOUNTER — Encounter: Payer: Self-pay | Admitting: Family Medicine

## 2019-07-26 ENCOUNTER — Other Ambulatory Visit: Payer: Self-pay

## 2019-07-26 VITALS — BP 100/80 | HR 68 | Temp 98.4°F | Ht 73.0 in | Wt 221.8 lb

## 2019-07-26 DIAGNOSIS — Z0000 Encounter for general adult medical examination without abnormal findings: Secondary | ICD-10-CM | POA: Diagnosis not present

## 2019-07-26 DIAGNOSIS — Z13220 Encounter for screening for lipoid disorders: Secondary | ICD-10-CM | POA: Diagnosis not present

## 2019-07-26 DIAGNOSIS — Z1211 Encounter for screening for malignant neoplasm of colon: Secondary | ICD-10-CM

## 2019-07-26 DIAGNOSIS — Z125 Encounter for screening for malignant neoplasm of prostate: Secondary | ICD-10-CM

## 2019-07-26 DIAGNOSIS — Z79899 Other long term (current) drug therapy: Secondary | ICD-10-CM

## 2019-07-26 LAB — HEPATIC FUNCTION PANEL
ALT: 23 U/L (ref 0–53)
AST: 22 U/L (ref 0–37)
Albumin: 4.1 g/dL (ref 3.5–5.2)
Alkaline Phosphatase: 57 U/L (ref 39–117)
Bilirubin, Direct: 0.1 mg/dL (ref 0.0–0.3)
Total Bilirubin: 0.5 mg/dL (ref 0.2–1.2)
Total Protein: 7.1 g/dL (ref 6.0–8.3)

## 2019-07-26 LAB — CBC WITH DIFFERENTIAL/PLATELET
Basophils Absolute: 0 10*3/uL (ref 0.0–0.1)
Basophils Relative: 0.5 % (ref 0.0–3.0)
Eosinophils Absolute: 0.1 10*3/uL (ref 0.0–0.7)
Eosinophils Relative: 1.7 % (ref 0.0–5.0)
HCT: 44.5 % (ref 39.0–52.0)
Hemoglobin: 15.1 g/dL (ref 13.0–17.0)
Lymphocytes Relative: 30.2 % (ref 12.0–46.0)
Lymphs Abs: 1.4 10*3/uL (ref 0.7–4.0)
MCHC: 34 g/dL (ref 30.0–36.0)
MCV: 94.5 fl (ref 78.0–100.0)
Monocytes Absolute: 0.5 10*3/uL (ref 0.1–1.0)
Monocytes Relative: 11.4 % (ref 3.0–12.0)
Neutro Abs: 2.6 10*3/uL (ref 1.4–7.7)
Neutrophils Relative %: 56.2 % (ref 43.0–77.0)
Platelets: 179 10*3/uL (ref 150.0–400.0)
RBC: 4.71 Mil/uL (ref 4.22–5.81)
RDW: 14 % (ref 11.5–15.5)
WBC: 4.6 10*3/uL (ref 4.0–10.5)

## 2019-07-26 LAB — BASIC METABOLIC PANEL
BUN: 12 mg/dL (ref 6–23)
CO2: 30 mEq/L (ref 19–32)
Calcium: 9.3 mg/dL (ref 8.4–10.5)
Chloride: 105 mEq/L (ref 96–112)
Creatinine, Ser: 0.99 mg/dL (ref 0.40–1.50)
GFR: 76.4 mL/min (ref >60.00)
Glucose, Bld: 101 mg/dL — ABNORMAL HIGH (ref 70–99)
Potassium: 3.9 mEq/L (ref 3.5–5.1)
Sodium: 139 mEq/L (ref 135–145)

## 2019-07-26 LAB — LIPID PANEL
Cholesterol: 152 mg/dL (ref 0–200)
HDL: 50.4 mg/dL (ref >39.00)
LDL Cholesterol: 88 mg/dL (ref 0–99)
NonHDL: 101.46
Total CHOL/HDL Ratio: 3
Triglycerides: 69 mg/dL (ref 0.0–149.0)
VLDL: 13.8 mg/dL (ref 0.0–40.0)

## 2019-07-26 MED ORDER — FLUOCINONIDE 0.05 % EX CREA: TOPICAL_CREAM | CUTANEOUS | 3 refills | Status: DC

## 2019-07-26 MED ORDER — SILDENAFIL CITRATE 20 MG PO TABS: ORAL_TABLET | ORAL | 11 refills | Status: DC

## 2019-07-26 NOTE — Progress Notes (Signed)
Joshua Bake [name], MD Primary Care and [place] at [facility] [place], [address] Phone: [phone]  FAX: [phone]  Joshua Lang - 64 y.o. male  MRN [id number]  Date of Birth: August 03, 1955  Visit Date: 07/26/2019  PCP: Joshua Loffler, MD  Referred by: Joshua Loffler, MD  Chief Complaint  Patient presents with  . Annual Exam    This visit occurred during the SARS-CoV-2 public health emergency.  Safety protocols were in place, including screening questions prior to the visit, additional usage of staff PPE, and extensive cleaning of exam room while observing appropriate contact time as indicated for disinfecting solutions.   Patient Care Team: Joshua Loffler, MD as PCP - General Joshua Loffler, MD as Consulting Physician (Family Medicine) Joshua Lang, Joshua Gleason, MD (General Surgery) Subjective:   Joshua Lang is a 64 y.o. pleasant patient who presents with the following:  Preventative Health Maintenance Visit:  Health Maintenance Summary Reviewed and updated, unless pt declines services.  Tobacco History Reviewed. Alcohol: No concerns, no excessive use Exercise Habits: Some activity, rec at least 30 mins 5 times a week STD concerns: no risk or activity to increase risk Drug Use: None  Colon? COVID-19 Joshua Lang is globally doing well.  He understands that he needs to get COVID-19 vaccine.Joshua Lang on testes has gone away  Wants to do some exercise.  Going way back will get some L sided abd pain.  Around christmas. > 1 year ago. Had a rtc repair.  He has been slow to recover in terms of exercise and weight lifting. Leg raises - pain in L lower abd. Able to work-out and do some push-ups.   On the exercises will do the squats.  If doing reps for high #  Health Maintenance  Topic Date Due  . COLONOSCOPY  12/09/2017  . TETANUS/TDAP  12/31/2025  . INFLUENZA VACCINE  Completed  . Hepatitis C Screening   Completed  . HIV Screening  Completed   Immunization History  Administered Date(s) Administered  . Influenza Whole 02/08/2009, 02/07/2012  . Influenza, Seasonal, Injecte, Preservative Fre 01/08/2014  . Influenza,inj,Quad PF,6+ Mos 01/09/2015, 01/01/2016, 01/14/2017, 02/03/2018, 01/10/2019  . Pneumococcal Conjugate-13 04/23/2015  . Pneumococcal Polysaccharide-23 03/28/2016  . Td 02/13/2009  . Tdap 01/01/2016  . Zoster 06/22/2013  . Zoster Recombinat (Shingrix) 01/14/2017, 03/23/2017   Patient Active Problem List   Diagnosis Date Noted  . Benign prostatic hyperplasia without lower urinary tract symptoms 03/10/2017  . Hernia of abdominal cavity 01/23/2016  . S/P total hip arthroplasty 05/20/2015  . ALLERGIC RHINITIS 02/17/2009  . 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 UNEQUAL LEG LENGTH 02/13/2009  . Sleep apnea 02/13/2009  . COLONIC POLYPS 10/30/2008  . HYPERCHOLESTEROLEMIA 10/30/2008  . NEPHROLITHIASIS 10/30/2008  . DEGENERATIVE JOINT DISEASE, HIPS 10/30/2008    Past Medical History:  Diagnosis Date  . Allergic rhinitis   . 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 Colonic polyp   . Complication of anesthesia    WITH 1ST HERNIA SURGERY PT STATES HE WAS CHOKING WITH TUBE IN  . ED (erectile dysfunction)   . Enlarged prostate    Moderately  . GERD (gastroesophageal reflux disease)   . History of kidney stones    H/O  . Hypercholesteremia     Past Surgical History:  Procedure Laterality Date  . COLONOSCOPY  2015  . HERNIA REPAIR Left 1990's   X2  . INGUINAL HERNIA REPAIR  Right 03/24/2016   Procedure: HERNIA REPAIR INGUINAL ADULT;  Surgeon: Joshua Bellow, MD;  Location: [facility] ORS;  Service: General;  Laterality: Right;  . JOINT REPLACEMENT Bilateral    THR  . SHOULDER ARTHROSCOPY WITH OPEN ROTATOR CUFF REPAIR Right 03/22/2018   Procedure: SHOULDER ARTHROSCOPY WITH OPEN ROTATOR CUFF REPAIR;  Surgeon: Joshua Mull, MD;  Location: [facility] ORS;  Service: Orthopedics;   Laterality: Right;  . SHOULDER OPEN ROTATOR CUFF REPAIR  2014   Lang, partial RTC tear with probably SAD, possible DCE  . SHOULDER SURGERY  2009   open, 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, (Joshua Lang)  . TOTAL HIP ARTHROPLASTY  09/2009   (Lang)  . TOTAL HIP ARTHROPLASTY Left 05/20/2015   Procedure: TOTAL HIP ARTHROPLASTY;  Surgeon: Joshua Leep, MD;  Location: [facility] ORS;  Service: Orthopedics;  Laterality: Left;    Family History  Problem Relation Age of Onset  . Prostate cancer Father   . Migraines Sister     Past Medical History, Surgical History, Social History, Family History, Problem List, Medications, and Allergies have been reviewed and updated if relevant.  Review of Systems: Pertinent positives are listed above.  Otherwise, a full 14 point review of systems has been done in full and it is negative except where it is noted positive.  Objective:   BP 100/80   Pulse 68   Temp 98.4 F (36.9 C) (Temporal)   Ht 6\' 1"  (1.854 m)   Wt 221 lb 12 oz (100.6 kg)   SpO2 96%   BMI 29.26 kg/m  Ideal Body Weight: Weight in (lb) to have BMI = 25: 189.1  Ideal Body Weight: Weight in (lb) to have BMI = 25: 189.1 No exam data present Depression screen [facility] 2/9 07/26/2019 07/20/2018 07/14/2017 07/13/2016  Decreased Interest 0 0 0 0  Down, Depressed, Hopeless 0 0 0 0  PHQ - 2 Score 0 0 0 0   GEN: well developed, well nourished, no acute distress Eyes: conjunctiva and lids normal, PERRLA, EOMI ENT: TM clear, nares clear, oral exam WNL Neck: supple, no lymphadenopathy, no thyromegaly, no JVD Pulm: clear to auscultation and percussion, respiratory effort normal CV: regular rate and rhythm, S1-S2, no murmur, rub or gallop, no bruits, peripheral pulses normal and symmetric, no cyanosis, clubbing, edema or varicosities GI: soft, non-tender; no hepatosplenomegaly, masses; active bowel sounds all quadrants GU: no hernia, testicular mass, penile discharge  There is also no femoral hernia that is present.   Lymph: no cervical, axillary or inguinal adenopathy MSK: gait normal, muscle tone and strength WNL, no joint swelling, effusions, discoloration, crepitus  SKIN: clear, good turgor, color WNL, no rashes, lesions, or ulcerations Neuro: normal mental status, normal strength, sensation, and motion Psych: alert; oriented to person, place and time, normally interactive and not anxious or depressed in appearance.  All labs reviewed with patient. Results for orders placed or performed in visit on 0000000  Basic metabolic panel  Result Value Ref Range   Sodium 139 135 - 145 mEq/L   Potassium 3.9 3.5 - 5.1 mEq/L   Chloride 105 96 - 112 mEq/L   CO2 30 19 - 32 mEq/L   Glucose, Bld 101 (H) 70 - 99 mg/dL   BUN 12 6 - 23 mg/dL   Creatinine, Ser 0.99 0.40 - 1.50 mg/dL   GFR 76.40 >60.00 mL/min   Calcium 9.3 8.4 - 10.5 mg/dL  CBC with Differential/Platelet  Result Value Ref Range   WBC 4.6 4.0 - 10.5 K/uL  RBC 4.71 4.22 - 5.81 Mil/uL   Hemoglobin 15.1 13.0 - 17.0 g/dL   HCT 44.5 39.0 - 52.0 %   MCV 94.5 78.0 - 100.0 fl   MCHC 34.0 30.0 - 36.0 g/dL   RDW 14.0 11.5 - 15.5 %   Platelets 179.0 150.0 - 400.0 K/uL   Neutrophils Relative % 56.2 43.0 - 77.0 %   Lymphocytes Relative 30.2 12.0 - 46.0 %   Monocytes Relative 11.4 3.0 - 12.0 %   Eosinophils Relative 1.7 0.0 - 5.0 %   Basophils Relative 0.5 0.0 - 3.0 %   Neutro Abs 2.6 1.4 - 7.7 K/uL   Lymphs Abs 1.4 0.7 - 4.0 K/uL   Monocytes Absolute 0.5 0.1 - 1.0 K/uL   Eosinophils Absolute 0.1 0.0 - 0.7 K/uL   Basophils Absolute 0.0 0.0 - 0.1 K/uL  Hepatic function panel  Result Value Ref Range   Total Bilirubin 0.5 0.2 - 1.2 mg/dL   Bilirubin, Direct 0.1 0.0 - 0.3 mg/dL   Alkaline Phosphatase 57 39 - 117 U/L   AST 22 0 - 37 U/L   ALT 23 0 - 53 U/L   Total Protein 7.1 6.0 - 8.3 g/dL   Albumin 4.1 3.5 - 5.2 g/dL  Lipid panel  Result Value Ref Range   Cholesterol 152 0 - 200 mg/dL   Triglycerides 69.0 0.0 - 149.0 mg/dL   HDL 50.40 >39.00  mg/dL   VLDL 13.8 0.0 - 40.0 mg/dL   LDL Cholesterol 88 0 - 99 mg/dL   Total CHOL/HDL Ratio 3    NonHDL 101.46     Assessment and Plan:     ICD-10-CM   1. Healthcare maintenance  123456 Basic metabolic panel    CBC with Differential/Platelet    Hepatic function panel    PSA, Total with Reflex to PSA, Free    Lipid panel  2. Colon cancer screening  Z12.11 Ambulatory referral to Gastroenterology  3. Screening PSA (prostate specific antigen)  Z12.5 PSA, Total with Reflex to PSA, Free  4. Encounter for long-term current use of medication  123456 Basic metabolic panel    CBC with Differential/Platelet    Hepatic function panel  5. Screening, lipid  Z13.220 Lipid panel   It is time for his follow-up colonoscopy.  Lab work as appropriate for 64 year old male.  Health Maintenance Exam: The patient's preventative maintenance and recommended screening tests for an annual wellness exam were reviewed in full today. Brought up to date unless services declined.  Counselled on the importance of diet, exercise, and its role in overall health and mortality. The patient's FH and SH was reviewed, including their home life, tobacco status, and drug and alcohol status.  Follow-up in 1 year for physical exam or additional follow-up below.  Follow-up: No follow-ups on file. Or follow-up in 1 year if not noted.  Meds ordered this encounter  Medications  . fluocinonide cream (LIDEX) 0.05 %    Sig: Apply bid as directed    Dispense:  60 g    Refill:  3  . sildenafil (REVATIO) 20 MG tablet    Sig: Take 2 to 5 tablets by mouth 30 minutes prior to intercourse.    Dispense:  50 tablet    Refill:  11   Medications Discontinued During This Encounter  Medication Reason  . sildenafil (REVATIO) 20 MG tablet Reorder  . fluocinonide cream (LIDEX) 0.05 % Reorder   Orders Placed This Encounter  Procedures  . Basic metabolic panel  .  CBC with Differential/Platelet  . Hepatic function panel  . PSA,  Total with Reflex to PSA, Free  . Lipid panel  . Ambulatory referral to Gastroenterology    Signed,  Frederico Hamman T. Hannan Tetzlaff, MD   Allergies as of 07/26/2019      Reactions   Oxycodone Itching   And hyper feeling/mind races   Terbinafine And Related    Trouble sleeping, depression & fatigue   Tramadol Itching   Antihistamines, Chlorpheniramine-type Other (See Comments)   Urinary frequency/urgency.      Medication List       Accurate as of July 26, 2019 11:59 PM. If you have any questions, ask your nurse or doctor.        aspirin 81 MG chewable tablet Chew 81 mg by mouth at bedtime.   Black Elderberry 50 MG/5ML Syrp Take 5 mLs by mouth in the morning.   CALCIUM 600+D3 PO Take 1 tablet by mouth 2 (two) times daily.   cholecalciferol 1000 units tablet Commonly known as: VITAMIN D Take 1,000 Units by mouth 2 (two) times daily.   CRANBERRY PO Take 1 tablet by mouth 2 (two) times a day.   famotidine 20 MG tablet Commonly known as: PEPCID Take 20 mg by mouth daily as needed for heartburn or indigestion.   Fish Oil 1200 MG Caps Take 1,200 mg by mouth 2 (two) times daily.   fluocinonide cream 0.05 % Commonly known as: LIDEX Apply bid as directed   fluticasone 50 MCG/ACT nasal spray Commonly known as: FLONASE Place 2 sprays into both nostrils daily as needed for allergies.   KELP PO Take 1 tablet by mouth 2 (two) times daily.   Melatonin 10 MG Tabs Take 1 tablet by mouth at bedtime as needed.   multivitamin with minerals Tabs tablet Take 1 tablet by mouth 2 (two) times daily.   OVER THE COUNTER MEDICATION Take 4 tablets by mouth daily. TOTAL GREENS   polycarbophil 625 MG tablet Commonly known as: FIBERCON Take 1,875 mg by mouth daily as needed (for digestive regularity.).   PROBIOTIC PO Take 1 capsule by mouth at bedtime.   Pumpkin Seed Oil Caps Take 1 capsule by mouth 2 (two) times daily.   RESVERATROL PO Take 500 mg by mouth 2 (two) times daily.    Saw Palmetto 450 MG Caps Take 450 mg by mouth 2 (two) times daily.   sildenafil 20 MG tablet Commonly known as: REVATIO Take 2 to 5 tablets by mouth 30 minutes prior to intercourse.   simvastatin 40 MG tablet Commonly known as: ZOCOR Take 1 tablet (40 mg total) by mouth at bedtime.   tamsulosin 0.4 MG Caps capsule Commonly known as: FLOMAX Take 1 capsule (0.4 mg total) by mouth daily.   vitamin C 500 MG tablet Commonly known as: ASCORBIC ACID Take 500 mg by mouth 2 (two) times daily.   ZINC PO Take 1 tablet by mouth in the morning.

## 2019-07-27 LAB — PSA, TOTAL WITH REFLEX TO PSA, FREE: PSA, Total: 0.6 ng/mL (ref <=4.0)

## 2019-11-02 ENCOUNTER — Other Ambulatory Visit: Payer: Self-pay | Admitting: Family Medicine

## 2019-12-22 LAB — HM COLONOSCOPY

## 2020-01-17 ENCOUNTER — Ambulatory Visit: Payer: BC Managed Care – PPO | Admitting: Urology

## 2020-01-17 ENCOUNTER — Other Ambulatory Visit: Payer: Self-pay

## 2020-01-17 ENCOUNTER — Encounter: Payer: Self-pay | Admitting: Urology

## 2020-01-17 VITALS — BP 137/79 | HR 69 | Ht 73.0 in | Wt 220.0 lb

## 2020-01-17 DIAGNOSIS — Z125 Encounter for screening for malignant neoplasm of prostate: Secondary | ICD-10-CM | POA: Diagnosis not present

## 2020-01-17 DIAGNOSIS — N390 Urinary tract infection, site not specified: Secondary | ICD-10-CM

## 2020-01-17 DIAGNOSIS — N400 Enlarged prostate without lower urinary tract symptoms: Secondary | ICD-10-CM

## 2020-01-17 LAB — BLADDER SCAN AMB NON-IMAGING: Scan Result: 0

## 2020-01-17 NOTE — Progress Notes (Signed)
   01/17/2020 2:41 PM   Joshua Lang 1955/07/06 [id number]  Reason for visit: Follow up history of recurrent prostatitis, PSA screening  HPI: I saw Joshua Lang back in urology clinic for follow-up of the above issues.  Briefly he is a healthy 64 year old male with a family history of prostate cancer in his father who has had 7-8 episodes of culture documented prostatitis over the last 10 to 15 years.  Many of these were associated with stress.  We previously offered further evaluation with cystoscopy and CT urogram, but he deferred.  He was amenable to starting cranberry tablet prophylaxis, and has not had any infections since that time and is very pleased with his urinary symptoms at this time.  He really denies any urinary complaints and is doing very well.  He continues on Flomax daily.  This was apparently started by urologist many years ago when he was having prostatitis symptoms.  He wonders if he can stop this medication safely.  PVR today 0 mL.  PSA was checked in March 2021 with PCP and was stable at 0.6 from 0.6 last year.  We a long conversation about the pros and cons of Flomax, and I think it is very safe to discontinue this medication.  We discussed things to watch out for including weakening stream or feeling of incomplete emptying.  I recommended continuing cranberry tablets for UTI prevention.  Return precautions were discussed at length  RTC 1 year for PVR and PSA   I spent 30 total minutes on the day of the encounter including pre-visit review of the medical record, face-to-face time with the patient, and post visit ordering of labs/imaging/tests.  Billey Co, [facility] [address], [place], [place] [address] [phone]

## 2020-04-22 ENCOUNTER — Telehealth: Payer: Self-pay | Admitting: *Deleted

## 2020-04-22 NOTE — Telephone Encounter (Signed)
Yes

## 2020-04-22 NOTE — Telephone Encounter (Signed)
Patient left a voicemail stating that he has had both of his pfizer coivd vaccines. Patient stated that he is really wondering if he should get the booster vaccine. Patient stated that he is not 66 yet and is in pretty good health.  Patient stated that he would really like to get Dr. Lillie Fragmin opinion on this.

## 2020-04-23 ENCOUNTER — Other Ambulatory Visit: Payer: Self-pay

## 2020-04-23 ENCOUNTER — Ambulatory Visit: Payer: BC Managed Care – PPO | Admitting: Internal Medicine

## 2020-04-23 ENCOUNTER — Encounter: Payer: Self-pay | Admitting: Internal Medicine

## 2020-04-23 VITALS — BP 120/80 | HR 65 | Temp 98.1°F | Wt 221.0 lb

## 2020-04-23 DIAGNOSIS — R350 Frequency of micturition: Secondary | ICD-10-CM

## 2020-04-23 DIAGNOSIS — N401 Enlarged prostate with lower urinary tract symptoms: Secondary | ICD-10-CM | POA: Diagnosis not present

## 2020-04-23 DIAGNOSIS — Z23 Encounter for immunization: Secondary | ICD-10-CM

## 2020-04-23 DIAGNOSIS — R351 Nocturia: Secondary | ICD-10-CM | POA: Diagnosis not present

## 2020-04-23 LAB — POC URINALSYSI DIPSTICK (AUTOMATED)
Bilirubin, UA: NEGATIVE
Blood, UA: NEGATIVE
Glucose, UA: NEGATIVE
Ketones, UA: NEGATIVE
Leukocytes, UA: NEGATIVE
Nitrite, UA: NEGATIVE
Protein, UA: NEGATIVE
Spec Grav, UA: 1.015 (ref 1.010–1.025)
Urobilinogen, UA: 0.2 E.U./dL
pH, UA: 5.5 (ref 5.0–8.0)

## 2020-04-23 NOTE — Patient Instructions (Signed)

## 2020-04-23 NOTE — Addendum Note (Signed)
Addended by: Lurlean Nanny on: 04/23/2020 04:38 PM   Modules accepted: Orders

## 2020-04-23 NOTE — Telephone Encounter (Signed)
LMTCB

## 2020-04-23 NOTE — Progress Notes (Signed)
HPI  Pt presents to the clinic today with c/o urinary frequency. This has been an ongoing issue, worse at night. He denies urgency, dysuria or blood in his urine. He denies fever, chills, nausea or low back pain. He does have a history of BPH. He takes [employer] and Sildenafil as needed. He does follow with urology.   Review of Systems  Past Medical History:  Diagnosis Date  . Allergic rhinitis   . 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 Colonic polyp   . Complication of anesthesia    WITH 1ST HERNIA SURGERY PT STATES HE WAS CHOKING WITH TUBE IN  . ED (erectile dysfunction)   . Enlarged prostate    Moderately  . GERD (gastroesophageal reflux disease)   . History of kidney stones    H/O  . Hypercholesteremia     Family History  Problem Relation Age of Onset  . Prostate cancer Father   . Migraines Sister     Social History   Socioeconomic History  . Marital status: Single    Spouse name: Not on file  . Number of children: Not on file  . Years of education: Not on file  . Highest education level: Not on file  Occupational History  . Occupation: [employer], Environmental manager, head    Comment: Doctorate  Tobacco Use  . Smoking status: Never Smoker  . Smokeless tobacco: Never Used  Vaping Use  . Vaping Use: Never used  Substance and Sexual Activity  . Alcohol use: Yes    Comment: occassional beer  . Drug use: No  . Sexual activity: Not on file  Other Topics Concern  . Not on file  Social History Narrative  . Not on file   Social Determinants of Health   Financial Resource Strain: Not on file  Food Insecurity: Not on file  Transportation Needs: Not on file  Physical Activity: Not on file  Stress: Not on file  Social Connections: Not on file  Intimate Partner Violence: Not on file    Allergies  Allergen Reactions  . Oxycodone Itching    And hyper feeling/mind races  . Terbinafine And Related     Trouble sleeping, depression & fatigue  . Tramadol Itching  .  Antihistamines, Chlorpheniramine-Type Other (See Comments)    Urinary frequency/urgency.     Constitutional: Denies fever, malaise, fatigue, headache or abrupt weight changes.   GU: Pt reports frequency, nocturia. Denies frequency, dysuria, burning sensation, blood in urine, odor or discharge. Skin: Denies redness, rashes, lesions or ulcercations.   No other specific complaints in a complete review of systems (except as listed in HPI above).    Objective:   Physical Exam  BP 120/80   Pulse 65   Temp 98.1 F (36.7 C) (Temporal)   Wt 221 lb (100.2 kg)   SpO2 97%   BMI 29.16 kg/m   Wt Readings from Last 3 Encounters:  01/17/20 220 lb (99.8 kg)  07/26/19 221 lb 12 oz (100.6 kg)  01/12/19 216 lb (98 kg)    General: Appears his stated age, obese, in NAD. Cardiovascular: Normal rate. Pulmonary/Chest: Normal effort. Rectal: Deferred.       Assessment & Plan:   Urinary Frequency, Nocturia, BPH:  Urinalysis: negative Drink plenty of fluids He will restart Flomax 0.4 mg- advised him to also discuss this with urology Continue Saw Palmetto Follow up with urology as previously planned  RTC as needed or if symptoms persist. Webb Silversmith, NP This  visit occurred during the SARS-CoV-2 public health emergency.  Safety protocols were in place, including screening questions prior to the visit, additional usage of staff PPE, and extensive cleaning of exam room while observing appropriate contact time as indicated for disinfecting solutions.

## 2020-04-26 ENCOUNTER — Ambulatory Visit: Payer: BC Managed Care – PPO | Attending: Internal Medicine

## 2020-04-26 DIAGNOSIS — Z23 Encounter for immunization: Secondary | ICD-10-CM

## 2020-04-26 NOTE — Progress Notes (Signed)
   Covid-19 Vaccination Clinic  Name:  Joshua Lang    MRN: [id number] DOB: Jun 24, 1955  04/26/2020  [name] was observed post Covid-19 immunization for 15 minutes without incident. He was provided with Vaccine Information Sheet and instruction to access the V-Safe system.   [name] was instructed to call 911 with any severe reactions post vaccine: Marland Kitchen Difficulty breathing  . Swelling of face and throat  . A fast heartbeat  . A bad rash all over body  . Dizziness and weakness   Immunizations Administered    Name Date Dose VIS Date Route   Pfizer COVID-19 Vaccine 04/26/2020  3:24 PM 0.3 mL 02/28/2020 Intramuscular   Manufacturer: [place]   Lot: [id number]   [place]: [id number]

## 2020-04-29 NOTE — Telephone Encounter (Signed)
Left message for Mr. Joshua Lang that Dr. Lorelei Pont does recommend that he get the Covid Booster.

## 2020-05-14 ENCOUNTER — Encounter: Payer: Self-pay | Admitting: Internal Medicine

## 2020-05-15 ENCOUNTER — Other Ambulatory Visit: Payer: Self-pay

## 2020-05-15 DIAGNOSIS — Z20822 Contact with and (suspected) exposure to covid-19: Secondary | ICD-10-CM

## 2020-05-17 ENCOUNTER — Telehealth: Payer: Self-pay | Admitting: Family Medicine

## 2020-05-17 NOTE — Telephone Encounter (Signed)
Joshua Lang notified by telephone that his Covid results are still pending.

## 2020-05-17 NOTE — Telephone Encounter (Signed)
Pt called in wanted his covid results

## 2020-05-18 LAB — NOVEL CORONAVIRUS, NAA: SARS-CoV-2, NAA: NOT DETECTED

## 2020-07-27 NOTE — Progress Notes (Signed)
Patrick Salemi T. Cristi Gwynn, MD, [place] at [facility] [place], [address]  Phone: [phone]  FAX: [phone]  Joshua Lang - 65 y.o. male  MRN [id number]  Date of Birth: 1955-06-20  Date: 07/29/2020  PCP: Owens Loffler, MD  Referral: Owens Loffler, MD  Chief Complaint  Patient presents with  . Annual Exam    This visit occurred during the SARS-CoV-2 public health emergency.  Safety protocols were in place, including screening questions prior to the visit, additional usage of staff PPE, and extensive cleaning of exam room while observing appropriate contact time as indicated for disinfecting solutions.   Patient Care Team: Owens Loffler, MD as PCP - General Owens Loffler, MD as Consulting Physician (Family Medicine) Bary Castilla, Forest Gleason, MD (General Surgery) Subjective:   Joshua Lang is a 65 y.o. pleasant patient who presents with the following:  Preventative Health Maintenance Visit:  Health Maintenance Summary Reviewed and updated, unless pt declines services.  Tobacco History Reviewed. Alcohol: No concerns, no excessive use Exercise Habits: Some activity, rec at least 30 mins 5 times a week STD concerns: no risk or activity to increase risk Drug Use: None  Question, colon cancer screening?  It looks like he last had a colonoscopy in 2009 - I see a colonoscopy conversation in epic with Dr. Alice Reichert, but I cannot find the report or the pathology.  He describes lower abdominal pain that has been present greater than 1 year.  It is more in the left lower quadrant, but also in the right lower quadrant as well.  History is significant for bilateral inguinal hernia repair on the left in the 1990s's as well as on the right in 2017.  He had a large ultra pro mesh done in 2017.  Other surgical history is noncontributory without additional intra-abdominal surgery.  This will vary  at times, and it worsens when he is exercising and when he increases intra-abdominal pressure.   Health Maintenance  Topic Date Due  . COLONOSCOPY (Pts 45-73yrs Insurance coverage will need to be confirmed)  12/09/2017  . TETANUS/TDAP  12/31/2025  . INFLUENZA VACCINE  Completed  . COVID-19 Vaccine  Completed  . Hepatitis C Screening  Completed  . HIV Screening  Completed  . HPV VACCINES  Aged Out   Immunization History  Administered Date(s) Administered  . Influenza Whole 02/08/2009, 02/07/2012  . Influenza, Seasonal, Injecte, Preservative Fre 01/08/2014  . Influenza,inj,Quad PF,6+ Mos 01/09/2015, 01/01/2016, 01/14/2017, 02/03/2018, 01/10/2019, 04/23/2020  . PFIZER(Purple Top)SARS-COV-2 Vaccination 07/27/2019, 08/17/2019, 04/26/2020  . Pneumococcal Conjugate-13 04/23/2015  . Pneumococcal Polysaccharide-23 03/28/2016  . Td 02/13/2009  . Tdap 01/01/2016  . Zoster 06/22/2013  . Zoster Recombinat (Shingrix) 01/14/2017, 03/23/2017   Patient Active Problem List   Diagnosis Date Noted  . Benign prostatic hyperplasia without lower urinary tract symptoms 03/10/2017  . Hernia of abdominal cavity 01/23/2016  . S/P total hip arthroplasty 05/20/2015  . ALLERGIC RHINITIS 02/17/2009  . 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 UNEQUAL LEG LENGTH 02/13/2009  . Sleep apnea 02/13/2009  . COLONIC POLYPS 10/30/2008  . HYPERCHOLESTEROLEMIA 10/30/2008  . NEPHROLITHIASIS 10/30/2008  . DEGENERATIVE JOINT DISEASE, HIPS 10/30/2008    Past Medical History:  Diagnosis Date  . Allergic rhinitis   . 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 Colonic polyp   . Complication of anesthesia    WITH 1ST HERNIA SURGERY PT STATES HE WAS CHOKING  WITH TUBE IN  . ED (erectile dysfunction)   . Enlarged prostate    Moderately  . GERD (gastroesophageal reflux disease)   . History of kidney stones    H/O  . Hypercholesteremia     Past Surgical History:  Procedure Laterality Date  . COLONOSCOPY  2015  .  HERNIA REPAIR Left 1990's   X2  . INGUINAL HERNIA REPAIR Right 03/24/2016   Procedure: HERNIA REPAIR INGUINAL ADULT;  Surgeon: Robert Bellow, MD;  Location: [facility] ORS;  Service: General;  Laterality: Right;  . JOINT REPLACEMENT Bilateral    THR  . SHOULDER ARTHROSCOPY WITH OPEN ROTATOR CUFF REPAIR Right 03/22/2018   Procedure: SHOULDER ARTHROSCOPY WITH OPEN ROTATOR CUFF REPAIR;  Surgeon: Corky Mull, MD;  Location: [facility] ORS;  Service: Orthopedics;  Laterality: Right;  . SHOULDER OPEN ROTATOR CUFF REPAIR  2014   [place], partial RTC tear with probably SAD, possible DCE  . SHOULDER SURGERY  2009   open, 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, (Jim [place])  . TOTAL HIP ARTHROPLASTY  09/2009   ([place])  . TOTAL HIP ARTHROPLASTY Left 05/20/2015   Procedure: TOTAL HIP ARTHROPLASTY;  Surgeon: Dereck Leep, MD;  Location: [facility] ORS;  Service: Orthopedics;  Laterality: Left;    Family History  Problem Relation Age of Onset  . Prostate cancer Father   . Migraines Sister     Past Medical History, Surgical History, Social History, Family History, Problem List, Medications, and Allergies have been reviewed and updated if relevant.  Review of Systems: Pertinent positives are listed above.  Otherwise, a full 14 point review of systems has been done in full and it is negative except where it is noted positive.  Objective:   BP 120/74   Pulse 65   Temp 98.2 F (36.8 C) (Temporal)   Ht 6\' 1"  (1.854 m)   Wt 226 lb 8 oz (102.7 kg)   SpO2 95%   BMI 29.88 kg/m  Ideal Body Weight: Weight in (lb) to have BMI = 25: 189.1  Ideal Body Weight: Weight in (lb) to have BMI = 25: 189.1 No exam data present Depression screen [facility] 2/9 07/29/2020 07/26/2019 07/20/2018 07/14/2017 07/13/2016  Decreased Interest 0 0 0 0 0  Down, Depressed, Hopeless 0 0 0 0 0  PHQ - 2 Score 0 0 0 0 0     GEN: well developed, well nourished, no acute distress Eyes: conjunctiva and lids normal, PERRLA, EOMI ENT: TM clear, nares clear, oral exam  WNL Neck: supple, no lymphadenopathy, no thyromegaly, no JVD Pulm: clear to auscultation and percussion, respiratory effort normal CV: regular rate and rhythm, S1-S2, no murmur, rub or gallop, no bruits, peripheral pulses normal and symmetric, no cyanosis, clubbing, edema or varicosities GI: soft, non-tender; no hepatosplenomegaly, masses; active bowel sounds all quadrants GU: Normal male.  No definite hernias felt. Lymph: no cervical, axillary or inguinal adenopathy MSK: gait normal, muscle tone and strength WNL, no joint swelling, effusions, discoloration, crepitus  SKIN: clear, good turgor, color WNL, no rashes, lesions, or ulcerations Neuro: normal mental status, normal strength, sensation, and motion Psych: alert; oriented to person, place and time, normally interactive and not anxious or depressed in appearance.  All labs reviewed with patient. Results for orders placed or performed in visit on 05/15/20  Novel Coronavirus, NAA ([facility])   Specimen: Nasopharyngeal(NP) swabs in vial transport medium   Nasopharynge  Result Value Ref Range   SARS-CoV-2, NAA Not Detected Not Detected    Assessment and Plan:  ICD-10-CM   1. Healthcare maintenance  Z00.00   2. Chronic bilateral lower abdominal pain  R10.31 CT Abdomen Pelvis W Contrast   G89.29    R10.32   3. HYPERCHOLESTEROLEMIA  E78.00 Lipid panel  4. Elevated PSA  R97.20 PSA, Total with Reflex to PSA, Free  5. Benign prostatic hyperplasia without lower urinary tract symptoms  N40.0 PSA, Total with Reflex to PSA, Free  6. Screening for diabetes mellitus  Z61.0 Basic metabolic panel    Hemoglobin A1c  7. Encounter for long-term current use of medication  R60.454 Basic metabolic panel    CBC with Differential/Platelet    Hepatic function panel  8. Chronic left lower quadrant pain  R10.32    G89.29   9. Hernia of abdominal cavity  K46.9    Obtain basic laboratories.  Refill medications.  PSA continues to be elevated, and he  does have good urological follow-up.  Obtain a CT of the abdomen pelvis with contrast: Patient also does have pain in the hypogastric and left lower quadrant region.  This is worsened with Valsalva as well as with exercise.  Evaluate for potential hernia, and evaluate pre-existing mesh and possible complication from prior bilateral hernia repair.  Assess for possible fistula.  Other intra-abdominal pathology including neoplasm cannot be excluded.  Health Maintenance Exam: The patient's preventative maintenance and recommended screening tests for an annual wellness exam were reviewed in full today. Brought up to date unless services declined.  Counselled on the importance of diet, exercise, and its role in overall health and mortality. The patient's FH and SH was reviewed, including their home life, tobacco status, and drug and alcohol status.  Follow-up in 1 year for physical exam or additional follow-up below.  Follow-up: No follow-ups on file. Or follow-up in 1 year if not noted.  Meds ordered this encounter  Medications  . simvastatin (ZOCOR) 40 MG tablet    Sig: TAKE 1 TABLET(40 MG) BY MOUTH AT BEDTIME    Dispense:  90 tablet    Refill:  3  . tamsulosin (FLOMAX) 0.4 MG CAPS capsule    Sig: Take 1 capsule (0.4 mg total) by mouth daily.    Dispense:  90 capsule    Refill:  3  . fluocinonide cream (LIDEX) 0.05 %    Sig: Apply bid as directed    Dispense:  60 g    Refill:  3  . sildenafil (REVATIO) 20 MG tablet    Sig: Take 2 to 5 tablets by mouth 30 minutes prior to intercourse.    Dispense:  50 tablet    Refill:  11   Medications Discontinued During This Encounter  Medication Reason  . Black Elderberry 50 MG/5ML SYRP Patient Preference  . cholecalciferol (VITAMIN D) 1000 UNITS tablet Change in therapy  . CRANBERRY PO Duplicate  . famotidine (PEPCID) 20 MG tablet Completed Course  . fluticasone (FLONASE) 50 MCG/ACT nasal spray Completed Course  . Misc Natural Products (RED  WINE COMPLEX) CAPS Patient Preference  . Omega-3 Fatty Acids (FISH OIL) 1200 MG CAPS Change in therapy  . vitamin C (ASCORBIC ACID) 500 MG tablet Change in therapy  . Probiotic Product (PROBIOTIC PO) Duplicate  . polycarbophil (FIBERCON) 098 MG tablet Duplicate  . fluocinonide cream (LIDEX) 0.05 % Reorder  . sildenafil (REVATIO) 20 MG tablet Reorder  . simvastatin (ZOCOR) 40 MG tablet Reorder  . tamsulosin (FLOMAX) 0.4 MG CAPS capsule Reorder   Orders Placed This Encounter  Procedures  . CT Abdomen Pelvis  W Contrast  . Basic metabolic panel  . CBC with Differential/Platelet  . Hepatic function panel  . Lipid panel  . PSA, Total with Reflex to PSA, Free  . Hemoglobin A1c    Signed,  Korrin Waterfield T. Maclean Foister, MD   Allergies as of 07/29/2020      Reactions   Oxycodone Itching   And hyper feeling/mind races   Terbinafine And Related    Trouble sleeping, depression & fatigue   Tramadol Itching   Antihistamines, Chlorpheniramine-type Other (See Comments)   Urinary frequency/urgency.      Medication List       Accurate as of July 29, 2020  1:46 PM. If you have any questions, ask your nurse or doctor.        STOP taking these medications   Black Elderberry 50 MG/5ML Syrp Stopped by: Owens Loffler, MD   famotidine 20 MG tablet Commonly known as: PEPCID Stopped by: Owens Loffler, MD   fluticasone 50 MCG/ACT nasal spray Commonly known as: FLONASE Stopped by: Owens Loffler, MD   Red Wine Complex Caps Stopped by: Owens Loffler, MD     TAKE these medications   aspirin 81 MG chewable tablet Chew 81 mg by mouth at bedtime.   B-complex with vitamin C tablet Take 1 tablet by mouth daily.   Cranberry 500 MG Tabs Take 1 tablet by mouth in the morning and at bedtime.   FIBER THERAPY PO Take by mouth as needed.   fluocinonide cream 0.05 % Commonly known as: LIDEX Apply bid as directed   Melatonin 10 MG Tabs Take 1 tablet by mouth at bedtime as needed.    multivitamin with minerals Tabs tablet Take 1 tablet by mouth 2 (two) times daily.   Omega-3 1000 MG Caps Take 1 capsule by mouth daily. What changed: Another medication with the same name was removed. Continue taking this medication, and follow the directions you see here. Changed by: Owens Loffler, MD   OVER THE COUNTER MEDICATION Take 4 tablets by mouth daily. TOTAL GREENS   RESVERATROL PO Take 500 mg by mouth 2 (two) times daily.   Saw Palmetto 450 MG Caps Take 450 mg by mouth 2 (two) times daily.   sildenafil 20 MG tablet Commonly known as: REVATIO Take 2 to 5 tablets by mouth 30 minutes prior to intercourse.   simvastatin 40 MG tablet Commonly known as: ZOCOR TAKE 1 TABLET(40 MG) BY MOUTH AT BEDTIME   SLEEP AID PO Take 1 tablet by mouth at bedtime as needed.   SUPER PROBIOTIC PO Take 1 tablet by mouth daily.   tamsulosin 0.4 MG Caps capsule Commonly known as: FLOMAX Take 1 capsule (0.4 mg total) by mouth daily. What changed: when to take this Changed by: Owens Loffler, MD   vitamin C with rose hips 500 MG tablet Take 500 mg by mouth daily. What changed: Another medication with the same name was removed. Continue taking this medication, and follow the directions you see here. Changed by: Owens Loffler, MD   Vitamin D3 50 MCG (2000 UT) Tabs Take 1 tablet by mouth daily. What changed: Another medication with the same name was removed. Continue taking this medication, and follow the directions you see here. Changed by: Owens Loffler, MD   Zinc 50 MG Tabs Take 1 tablet by mouth daily.

## 2020-07-29 ENCOUNTER — Other Ambulatory Visit: Payer: Self-pay

## 2020-07-29 ENCOUNTER — Ambulatory Visit (INDEPENDENT_AMBULATORY_CARE_PROVIDER_SITE_OTHER): Payer: BC Managed Care – PPO | Admitting: Family Medicine

## 2020-07-29 ENCOUNTER — Telehealth: Payer: Self-pay | Admitting: Family Medicine

## 2020-07-29 ENCOUNTER — Encounter: Payer: Self-pay | Admitting: Family Medicine

## 2020-07-29 VITALS — BP 120/74 | HR 65 | Temp 98.2°F | Ht 73.0 in | Wt 226.5 lb

## 2020-07-29 DIAGNOSIS — R972 Elevated prostate specific antigen [PSA]: Secondary | ICD-10-CM | POA: Diagnosis not present

## 2020-07-29 DIAGNOSIS — E7800 Pure hypercholesterolemia, unspecified: Secondary | ICD-10-CM

## 2020-07-29 DIAGNOSIS — Z79899 Other long term (current) drug therapy: Secondary | ICD-10-CM

## 2020-07-29 DIAGNOSIS — R1031 Right lower quadrant pain: Secondary | ICD-10-CM

## 2020-07-29 DIAGNOSIS — N400 Enlarged prostate without lower urinary tract symptoms: Secondary | ICD-10-CM

## 2020-07-29 DIAGNOSIS — R1032 Left lower quadrant pain: Secondary | ICD-10-CM

## 2020-07-29 DIAGNOSIS — Z0000 Encounter for general adult medical examination without abnormal findings: Secondary | ICD-10-CM

## 2020-07-29 DIAGNOSIS — G8929 Other chronic pain: Secondary | ICD-10-CM

## 2020-07-29 DIAGNOSIS — Z131 Encounter for screening for diabetes mellitus: Secondary | ICD-10-CM

## 2020-07-29 DIAGNOSIS — K469 Unspecified abdominal hernia without obstruction or gangrene: Secondary | ICD-10-CM

## 2020-07-29 LAB — CBC WITH DIFFERENTIAL/PLATELET
Basophils Absolute: 0 10*3/uL (ref 0.0–0.1)
Basophils Relative: 0.5 % (ref 0.0–3.0)
Eosinophils Absolute: 0.1 10*3/uL (ref 0.0–0.7)
Eosinophils Relative: 2.4 % (ref 0.0–5.0)
HCT: 41 % (ref 39.0–52.0)
Hemoglobin: 13.7 g/dL (ref 13.0–17.0)
Lymphocytes Relative: 35.5 % (ref 12.0–46.0)
Lymphs Abs: 1.3 10*3/uL (ref 0.7–4.0)
MCHC: 33.5 g/dL (ref 30.0–36.0)
MCV: 93.9 fl (ref 78.0–100.0)
Monocytes Absolute: 0.5 10*3/uL (ref 0.1–1.0)
Monocytes Relative: 12 % (ref 3.0–12.0)
Neutro Abs: 1.9 10*3/uL (ref 1.4–7.7)
Neutrophils Relative %: 49.6 % (ref 43.0–77.0)
Platelets: 163 10*3/uL (ref 150.0–400.0)
RBC: 4.37 Mil/uL (ref 4.22–5.81)
RDW: 13.5 % (ref 11.5–15.5)
WBC: 3.8 10*3/uL — ABNORMAL LOW (ref 4.0–10.5)

## 2020-07-29 LAB — LIPID PANEL
Cholesterol: 154 mg/dL (ref 0–200)
HDL: 52.7 mg/dL (ref >39.00)
LDL Cholesterol: 89 mg/dL (ref 0–99)
NonHDL: 101.16
Total CHOL/HDL Ratio: 3
Triglycerides: 59 mg/dL (ref 0.0–149.0)
VLDL: 11.8 mg/dL (ref 0.0–40.0)

## 2020-07-29 LAB — BASIC METABOLIC PANEL
BUN: 16 mg/dL (ref 6–23)
CO2: 28 mEq/L (ref 19–32)
Calcium: 9 mg/dL (ref 8.4–10.5)
Chloride: 106 mEq/L (ref 96–112)
Creatinine, Ser: 0.89 mg/dL (ref 0.40–1.50)
GFR: 91.01 mL/min (ref >60.00)
Glucose, Bld: 101 mg/dL — ABNORMAL HIGH (ref 70–99)
Potassium: 4.4 mEq/L (ref 3.5–5.1)
Sodium: 140 mEq/L (ref 135–145)

## 2020-07-29 LAB — HEPATIC FUNCTION PANEL
ALT: 16 U/L (ref 0–53)
AST: 18 U/L (ref 0–37)
Albumin: 4.5 g/dL (ref 3.5–5.2)
Alkaline Phosphatase: 47 U/L (ref 39–117)
Bilirubin, Direct: 0.1 mg/dL (ref 0.0–0.3)
Total Bilirubin: 0.4 mg/dL (ref 0.2–1.2)
Total Protein: 7 g/dL (ref 6.0–8.3)

## 2020-07-29 LAB — HEMOGLOBIN A1C: Hgb A1c MFr Bld: 5.6 % (ref 4.6–6.5)

## 2020-07-29 MED ORDER — FLUOCINONIDE 0.05 % EX CREA: TOPICAL_CREAM | CUTANEOUS | 3 refills | Status: DC

## 2020-07-29 MED ORDER — TAMSULOSIN HCL 0.4 MG PO CAPS: 0.4000 mg | ORAL_CAPSULE | Freq: Every day | ORAL | 3 refills | Status: DC

## 2020-07-29 MED ORDER — SILDENAFIL CITRATE 20 MG PO TABS: ORAL_TABLET | ORAL | 11 refills | Status: DC

## 2020-07-29 MED ORDER — SIMVASTATIN 40 MG PO TABS: ORAL_TABLET | ORAL | 3 refills | Status: DC

## 2020-07-29 NOTE — Telephone Encounter (Signed)
Please let me know when his lab results are back so I can mail patient a copy.

## 2020-07-29 NOTE — Telephone Encounter (Signed)
PT CALLED IN WANTED TO KNOW ABOUT GETTING A PAPER COPY OF HIS RESULTS IN THE MAIL

## 2020-07-29 NOTE — Telephone Encounter (Signed)
HIS LABS RESULTS

## 2020-07-30 LAB — PSA, TOTAL WITH REFLEX TO PSA, FREE: PSA, Total: 0.7 ng/mL (ref <=4.0)

## 2020-07-30 NOTE — Telephone Encounter (Signed)
Copy of labs mailed to patient as requested.

## 2020-08-01 ENCOUNTER — Encounter: Payer: Self-pay | Admitting: Family Medicine

## 2020-09-04 ENCOUNTER — Ambulatory Visit
Admission: RE | Admit: 2020-09-04 | Discharge: 2020-09-04 | Disposition: A | Discharge status: Home / Self Care | Payer: BC Managed Care – PPO | Source: Ambulatory Visit | Attending: Family Medicine | Admitting: Family Medicine

## 2020-09-04 ENCOUNTER — Other Ambulatory Visit: Payer: Self-pay

## 2020-09-04 DIAGNOSIS — R1031 Right lower quadrant pain: Secondary | ICD-10-CM | POA: Diagnosis not present

## 2020-09-04 DIAGNOSIS — G8929 Other chronic pain: Secondary | ICD-10-CM | POA: Diagnosis present

## 2020-09-04 DIAGNOSIS — R1032 Left lower quadrant pain: Secondary | ICD-10-CM | POA: Insufficient documentation

## 2020-09-04 LAB — POCT I-STAT CREATININE: Creatinine, Ser: 0.9 mg/dL (ref 0.61–1.24)

## 2020-09-04 MED ORDER — IOHEXOL 300 MG/ML  SOLN
100.0000 mL | Freq: Once | INTRAMUSCULAR | Status: AC | PRN
  Administered 2020-09-04: 100 mL via INTRAVENOUS

## 2020-09-06 ENCOUNTER — Encounter: Payer: Self-pay | Admitting: *Deleted

## 2020-12-05 ENCOUNTER — Telehealth: Payer: Self-pay

## 2020-12-05 NOTE — Telephone Encounter (Signed)
  As long as he has been quarantined for a full five days, he can leave the home as long as he wears a mask 100% of the time.  Without a mask, he is contagious.  No need to repeat a test in terms of quarantine.

## 2020-12-05 NOTE — Telephone Encounter (Signed)
Vm from pt stating he recently traveled to [place] and tested pos for [place].  Started on Paxlovid.  Pt is concerned that he took a home test and it's still positive.  Says he feels better just still fatigued.  Pt requests a call back at [phone] concerning pos home COVID test.

## 2020-12-05 NOTE — Telephone Encounter (Signed)
Joshua Lang notified as instructed by telephone.  Patient states understanding.

## 2021-01-15 ENCOUNTER — Ambulatory Visit: Payer: Self-pay | Admitting: Urology

## 2021-01-17 ENCOUNTER — Telehealth: Payer: Self-pay | Admitting: Family Medicine

## 2021-01-17 MED ORDER — TAMSULOSIN HCL 0.4 MG PO CAPS
0.4000 mg | ORAL_CAPSULE | Freq: Every day | ORAL | 1 refills | Status: DC
Start: 2021-01-17 — End: 2021-07-30

## 2021-01-21 ENCOUNTER — Telehealth: Payer: Self-pay | Admitting: Family Medicine

## 2021-01-21 NOTE — Telephone Encounter (Signed)
Joshua Lang has refills available at [facility] on his Simvastatin 40 mg.  I called and spoke with pharmacy tech.  They will get refill ready for patient.

## 2021-01-21 NOTE — Telephone Encounter (Signed)
Pt called stating that he needs a refill for Simvastatin and would like it sent to [facility] in [place]

## 2021-02-03 ENCOUNTER — Other Ambulatory Visit: Payer: Self-pay

## 2021-02-03 DIAGNOSIS — N400 Enlarged prostate without lower urinary tract symptoms: Secondary | ICD-10-CM

## 2021-02-04 ENCOUNTER — Other Ambulatory Visit: Payer: BC Managed Care – PPO

## 2021-02-04 ENCOUNTER — Other Ambulatory Visit: Payer: Self-pay

## 2021-02-04 DIAGNOSIS — N400 Enlarged prostate without lower urinary tract symptoms: Secondary | ICD-10-CM

## 2021-02-05 ENCOUNTER — Encounter: Payer: Self-pay | Admitting: Urology

## 2021-02-05 ENCOUNTER — Ambulatory Visit (INDEPENDENT_AMBULATORY_CARE_PROVIDER_SITE_OTHER): Payer: BC Managed Care – PPO | Admitting: Urology

## 2021-02-05 VITALS — BP 130/82 | HR 71 | Ht 73.0 in | Wt 218.0 lb

## 2021-02-05 DIAGNOSIS — N390 Urinary tract infection, site not specified: Secondary | ICD-10-CM

## 2021-02-05 DIAGNOSIS — N400 Enlarged prostate without lower urinary tract symptoms: Secondary | ICD-10-CM

## 2021-02-05 DIAGNOSIS — Z125 Encounter for screening for malignant neoplasm of prostate: Secondary | ICD-10-CM

## 2021-02-05 LAB — PSA: Prostate Specific Ag, Serum: 0.9 ng/mL (ref 0.0–4.0)

## 2021-02-05 LAB — BLADDER SCAN AMB NON-IMAGING

## 2021-02-05 NOTE — Progress Notes (Signed)
   02/05/2021 3:50 PM   Joshua Lang [birth year] [id number]  Reason for visit: Follow up PSA screening, recurrent prostatitis, BPH  HPI: Healthy 65 year old male with a family history of prostate cancer in his father who has had 7-8 episodes of culture documented prostatitis over the last 10 to 15 years.  Many of these were associated with stress.  We previously offered further evaluation with cystoscopy and CT urogram, but he deferred.  He was amenable to starting cranberry tablet prophylaxis, and has not had any infections since that time and is very pleased with his urinary symptoms.  We stopped Flomax at our last visit as he was having minimal urinary symptoms, but this was resumed by PCP recently for unclear indications.  He would like to continue on the Flomax, but we discussed he could trial off this in the future again to see if his urinary symptoms change.  He is unsure why it was restarted.  PSA has been stable, and remains low at 0.9 from 0.7 2 years ago, and 0.93  years ago.  We reviewed the AUA guidelines that recommend yearly screening up to age 40.  He also had a CT performed in April for lower abdominal pain and I personally viewed and interpreted these images.  No hydronephrosis or kidney stones, prostate obscured by bilateral hip prosthesis, bladder decompressed.  Recommended continuing cranberry tablet for prostatitis/UTI prevention RTC 1 year with PSA prior, PVR  Billey Co, MD  [facility] [address] [place], [place] [address] [phone]

## 2021-03-04 ENCOUNTER — Other Ambulatory Visit: Payer: Self-pay | Admitting: Family Medicine

## 2021-03-04 ENCOUNTER — Telehealth (INDEPENDENT_AMBULATORY_CARE_PROVIDER_SITE_OTHER): Payer: BC Managed Care – PPO | Admitting: Family Medicine

## 2021-03-04 ENCOUNTER — Encounter: Payer: Self-pay | Admitting: Family Medicine

## 2021-03-04 VITALS — Temp 98.1°F | Wt 218.0 lb

## 2021-03-04 DIAGNOSIS — J069 Acute upper respiratory infection, unspecified: Secondary | ICD-10-CM

## 2021-03-04 DIAGNOSIS — R52 Pain, unspecified: Secondary | ICD-10-CM

## 2021-03-04 LAB — POCT INFLUENZA A/B
Influenza A, POC: NEGATIVE
Influenza B, POC: NEGATIVE

## 2021-03-04 MED ORDER — HYDROCOD POLST-CPM POLST ER 10-8 MG/5ML PO SUER: 5.0000 mL | Freq: Every evening | ORAL | 0 refills | Status: DC | PRN

## 2021-03-04 NOTE — Addendum Note (Signed)
Addended by: Pilar Grammes on: 03/04/2021 03:40 PM   Modules accepted: Orders

## 2021-03-04 NOTE — Addendum Note (Signed)
Addended by: Waunita Schooner R on: 03/04/2021 12:48 PM   Modules accepted: Orders

## 2021-03-04 NOTE — Progress Notes (Signed)
I connected with [name] on 03/04/21 at 12:20 PM EDT by video and verified that I am speaking with the correct person using two identifiers.   I discussed the limitations, risks, security and privacy concerns of performing an evaluation and management service by video and the availability of in person appointments. I also discussed with the patient that there may be a patient responsible charge related to this service. The patient expressed understanding and agreed to proceed.  Patient location: Home Provider Location: [place] Participants: Lesleigh Noe and [name]   Subjective:     Joshua Lang is a 65 y.o. male presenting for Nasal Congestion (X 2 days with Yellow mucous ), Sore Throat, Generalized Body Aches, and Cough ("[place]"  tested negative for covid last night )     Sore Throat  This is a new problem. Episode onset: 03/02/2021. The problem has been gradually worsening. There has been no fever. Associated symptoms include congestion, coughing and headaches. Pertinent negatives include no diarrhea, shortness of breath or vomiting.  Cough Associated symptoms include headaches, myalgias, rhinorrhea and a sore throat. Pertinent negatives include no chest pain, chills, fever or shortness of breath.   Took a covid test last night which was negative  Did get the flu shot at [facility]  Saline spray all night Using this through the night   Review of Systems  Constitutional:  Positive for fatigue. Negative for chills and fever.  HENT:  Positive for congestion, rhinorrhea and sore throat.   Respiratory:  Positive for cough. Negative for shortness of breath.   Cardiovascular:  Negative for chest pain.  Gastrointestinal:  Negative for diarrhea, nausea and vomiting.  Musculoskeletal:  Positive for arthralgias and myalgias.  Neurological:  Positive for headaches.    Social History   Tobacco Use  Smoking Status Never  Smokeless Tobacco Never         Objective:   BP Readings from Last 3 Encounters:  02/05/21 130/82  07/29/20 120/74  04/23/20 120/80   Wt Readings from Last 3 Encounters:  03/04/21 218 lb (98.9 kg)  02/05/21 218 lb (98.9 kg)  07/29/20 226 lb 8 oz (102.7 kg)   Temp 98.1 F (36.7 C) (Oral)   Wt 218 lb (98.9 kg)   BMI 28.76 kg/m   Physical Exam Constitutional:      Appearance: Normal appearance. He is not ill-appearing.  HENT:     Head: Normocephalic and atraumatic.     Right Ear: External ear normal.     Left Ear: External ear normal.  Eyes:     Conjunctiva/sclera: Conjunctivae normal.  Pulmonary:     Effort: Pulmonary effort is normal. No respiratory distress.  Neurological:     Mental Status: He is alert. Mental status is at baseline.  Psychiatric:        Mood and Affect: Mood normal.        Behavior: Behavior normal.        Thought Content: Thought content normal.        Judgment: Judgment normal.            Assessment & Plan:   Problem List Items Addressed This Visit   None Visit Diagnoses     Viral URI with cough    -  Primary      Long discussion with patient regarding time course for viral illness vs bacterial sinusitis. He requested antibiotics - discussed that lack of fever or severe congestion/sinus pressure  and only 2 days of symptoms made this less likely and that most sinus symptoms resolve within 5-7 days.   Recommended f/u if not improving and consider abx  Call back instructions for worsening symptoms also provided  Discussed he is still within the timeline for flu treatment if positive - will test today and treat with Tamiflu if flu positive  Advised retesting for covid at home if loss taste/smell or new symptoms, declined send off testing today  Return if symptoms worsen or fail to improve.  Lesleigh Noe, MD

## 2021-03-06 ENCOUNTER — Telehealth: Payer: Self-pay

## 2021-03-06 MED ORDER — DOXYCYCLINE HYCLATE 100 MG PO TABS: 100.0000 mg | ORAL_TABLET | Freq: Two times a day (BID) | ORAL | 0 refills | Status: AC

## 2021-03-06 NOTE — Telephone Encounter (Signed)
Cindy on phones asked me to speak with pt; pt had video visit on 03/04/21; pt said he has taken the Tussend and mucinex and that has helped "a little bit" but pt said he needs an abx that targets infection in lungs. Pt said now he is tight in chest, pt can hear rattling in chest, pt gets SOB upon exertion such as walking up steps and pt said he hurts in chest when he coughs now. Pt said he guesses he hurts on both sides of his chest.pt said he does not have a fever; pt had flu test that was neg and has taken two home covid test that have been neg. Pt does not have hx of pneumonia. Pt said he is not in distress with his breathing but he knows his body and he needs an abx sent to [facility] [place]. Per VV on 03/04/21 Dr Einar Pheasant recommended for f/u if not improve and would consider abx. Pt request cb when reviewed by Dr Einar Pheasant and pt ask to send note to Dr Lorelei Pont who knows pt very well but is out of office. Pt request cb after this note reviewed. Pt said no one will be in office on Fri and if he has to will go to an UC but prefers if abx is sent to pharmacy.Please advise. Sending note to Dr Cody,Dr Lorelei Pont and [place] CMA.will also teams Christy. [facility]

## 2021-03-06 NOTE — Telephone Encounter (Signed)
Pt notified as instructed and pt voiced understanding that doxycycline sent to [facility] in [place]; pt very appreciative and pt will cb if symptoms do not clear.

## 2021-03-06 NOTE — Telephone Encounter (Signed)
Doxycycline sent to pharmacy in [place] for possible sinus vs pneumonia

## 2021-03-14 ENCOUNTER — Encounter: Payer: Self-pay | Admitting: Emergency Medicine

## 2021-03-14 ENCOUNTER — Other Ambulatory Visit: Payer: Self-pay

## 2021-03-14 ENCOUNTER — Ambulatory Visit (INDEPENDENT_AMBULATORY_CARE_PROVIDER_SITE_OTHER): Payer: BC Managed Care – PPO

## 2021-03-14 ENCOUNTER — Ambulatory Visit
Admission: EM | Admit: 2021-03-14 | Discharge: 2021-03-14 | Disposition: A | Discharge status: Home / Self Care | Payer: BC Managed Care – PPO | Attending: Emergency Medicine | Admitting: Emergency Medicine

## 2021-03-14 DIAGNOSIS — J0100 Acute maxillary sinusitis, unspecified: Secondary | ICD-10-CM

## 2021-03-14 DIAGNOSIS — R093 Abnormal sputum: Secondary | ICD-10-CM | POA: Diagnosis not present

## 2021-03-14 DIAGNOSIS — R058 Other specified cough: Secondary | ICD-10-CM | POA: Diagnosis not present

## 2021-03-14 DIAGNOSIS — R918 Other nonspecific abnormal finding of lung field: Secondary | ICD-10-CM | POA: Diagnosis not present

## 2021-03-14 DIAGNOSIS — R059 Cough, unspecified: Secondary | ICD-10-CM

## 2021-03-14 MED ORDER — AMOXICILLIN-POT CLAVULANATE 875-125 MG PO TABS: 1.0000 | ORAL_TABLET | Freq: Two times a day (BID) | ORAL | 0 refills | Status: DC

## 2021-03-14 NOTE — ED Triage Notes (Signed)
Pt c/o cough, yellow mucus, and chest congestion x 1 week. He had a tele-visit 1 week ago and a few days from that visit he was prescribed doxycycline. Pt states he is not better and has some "rattling" in his chest.

## 2021-03-14 NOTE — Discharge Instructions (Addendum)
Your chest xray is does not show any pneumonia.  But it does show 2 small nodules in the right upper lobe.  Please follow-up with your primary care provider next week to schedule a chest CT for further evaluation.  Take the Augmentin as directed.    Go to the emergency department if you have shortness of breath or other concerning symptoms.

## 2021-03-14 NOTE — ED Provider Notes (Signed)
Joshua Lang    CSN: [id number] Arrival date & time: 03/14/21  1848      History   Chief Complaint Chief Complaint  Patient presents with   Cough    HPI Joshua Lang is a 65 y.o. male.  Patient presents with >1 week history of cough productive of yellow sputum, chest congestion, "rattling" in his chest.  He denies fever, chills, shortness of breath, or other symptoms.  He had an E-visit on 03/04/2021; was diagnosed with viral URI with cough; treated symptomatically.  He contacted his PCP on 03/06/2021 because he was not improving and was prescribed doxycycline, which he completed.  He has also taken Mucinex.  His medical history includes allergic rhinitis, arthritis, kidney stones, enlarged prostate, GERD, hypercholesterolemia.  The history is provided by the patient and medical records.   Past Medical History:  Diagnosis Date   Allergic rhinitis    Arthritis    severe R > L hip   Colonic polyp    Complication of anesthesia    WITH 1ST HERNIA SURGERY PT STATES HE WAS CHOKING WITH TUBE IN   ED (erectile dysfunction)    Enlarged prostate    Moderately   GERD (gastroesophageal reflux disease)    History of kidney stones    H/O   Hypercholesteremia     Patient Active Problem List   Diagnosis Date Noted   Benign prostatic hyperplasia without lower urinary tract symptoms 03/10/2017   Hernia of abdominal cavity 01/23/2016   S/P total hip arthroplasty 05/20/2015   ALLERGIC RHINITIS 02/17/2009   ERECTILE DYSFUNCTION, 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   UNEQUAL LEG LENGTH 02/13/2009   Sleep apnea 02/13/2009   COLONIC POLYPS 10/30/2008   HYPERCHOLESTEROLEMIA 10/30/2008   NEPHROLITHIASIS 10/30/2008   DEGENERATIVE JOINT DISEASE, HIPS 10/30/2008    Past Surgical History:  Procedure Laterality Date   COLONOSCOPY  2015   HERNIA REPAIR Left 1990's   X2   INGUINAL HERNIA REPAIR Right 03/24/2016   Procedure: HERNIA REPAIR INGUINAL ADULT;  Surgeon: Robert Bellow, MD;  Location:  [facility];  Service: General;  Laterality: Right;   JOINT REPLACEMENT Bilateral    THR   SHOULDER ARTHROSCOPY WITH OPEN ROTATOR CUFF REPAIR Right 03/22/2018   Procedure: SHOULDER ARTHROSCOPY WITH OPEN ROTATOR CUFF REPAIR;  Surgeon: Corky Mull, MD;  Location: [facility];  Service: Orthopedics;  Laterality: Right;   SHOULDER OPEN ROTATOR CUFF REPAIR  2014   [place], partial RTC tear with probably SAD, possible DCE   SHOULDER SURGERY  2009   open, 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, (Jim [place]ooten)   TOTAL HIP ARTHROPLASTY  09/2009   ([place])   TOTAL HIP ARTHROPLASTY Left 05/20/2015   Procedure: TOTAL HIP ARTHROPLASTY;  Surgeon: Dereck Leep, MD;  Location: [facility];  Service: Orthopedics;  Laterality: Left;       Home Medications    Prior to Admission medications   Medication Sig Start Date End Date Taking? Authorizing Provider  amoxicillin-clavulanate (AUGMENTIN) 875-125 MG tablet Take 1 tablet by mouth every 12 (twelve) hours. 03/14/21  Yes Sharion Balloon, NP  Ascorbic Acid (VITAMIN C WITH ROSE HIPS) 500 MG tablet Take 500 mg by mouth daily.   Yes [provider]  aspirin 81 MG chewable tablet Chew 81 mg by mouth at bedtime.    Yes [provider]  B Complex-C (B-COMPLEX WITH VITAMIN C) tablet Take 1 tablet by mouth daily.   Yes [provider]  Cholecalciferol (VITAMIN D3) 50 MCG (2000 UT) TABS Take  1 tablet by mouth daily.   Yes [provider]  Cranberry 500 MG TABS Take 1 tablet by mouth in the morning and at bedtime.   Yes [provider]  Doxylamine Succinate, Sleep, (SLEEP AID PO) Take 1 tablet by mouth at bedtime as needed.   Yes [provider]  fluocinonide cream (LIDEX) 0.05 % Apply bid as directed 07/29/20  Yes Copland, Frederico Hamman, MD  Melatonin 10 MG TABS Take 1 tablet by mouth at bedtime as needed.   Yes [provider]  Methylcellulose, Laxative, (FIBER THERAPY PO) Take by mouth as needed.   Yes [provider]  Multiple  Vitamin (MULTIVITAMIN WITH MINERALS) TABS tablet Take 1 tablet by mouth 2 (two) times daily.   Yes [provider]  Omega-3 1000 MG CAPS Take 1 capsule by mouth daily.   Yes [provider]  OVER THE COUNTER MEDICATION Take 4 tablets by mouth daily. TOTAL GREENS   Yes [provider]  Probiotic Product (SUPER PROBIOTIC PO) Take 1 tablet by mouth daily.   Yes [provider]  RESVERATROL PO Take 500 mg by mouth 2 (two) times daily.    Yes [provider]  Saw Palmetto 450 MG CAPS Take 450 mg by mouth 2 (two) times daily.    Yes [provider]  sildenafil (REVATIO) 20 MG tablet Take 2 to 5 tablets by mouth 30 minutes prior to intercourse. 07/29/20  Yes Copland, Frederico Hamman, MD  simvastatin (ZOCOR) 40 MG tablet TAKE 1 TABLET(40 MG) BY MOUTH AT BEDTIME 07/29/20  Yes Copland, Frederico Hamman, MD  tamsulosin (FLOMAX) 0.4 MG CAPS capsule Take 1 capsule (0.4 mg total) by mouth daily. 01/17/21  Yes Copland, Frederico Hamman, MD  Zinc 50 MG TABS Take 1 tablet by mouth daily.   Yes [provider]    Family History Family History  Problem Relation Age of Onset   Prostate cancer Father    Migraines Sister     Social History Social History   Tobacco Use   Smoking status: Never   Smokeless tobacco: Never  Vaping Use   Vaping Use: Never used  Substance Use Topics   Alcohol use: Yes    Comment: occassional beer   Drug use: No     Allergies   Terbinafine and related; Tramadol; Antihistamines, chlorpheniramine-type; and Oxycodone   Review of Systems Review of Systems  Constitutional:  Negative for chills and fever.  HENT:  Positive for congestion. Negative for ear pain and sore throat.   Respiratory:  Positive for cough. Negative for shortness of breath.   Cardiovascular:  Negative for chest pain and palpitations.  Gastrointestinal:  Negative for diarrhea and vomiting.  Skin:  Negative for color change and rash.  All other systems reviewed and are  negative.   Physical Exam Triage Vital Signs ED Triage Vitals  Enc Vitals Group     BP      Pulse      Resp      Temp      Temp src      SpO2      Weight      Height      Head Circumference      Peak Flow      Pain Score      Pain Loc      Pain Edu?      Excl. in [place]?    No data found.  Updated Vital Signs BP 132/85 (BP Location: Left Arm)   Pulse  66   Temp 98.1 F (36.7 C) (Oral)   Resp 18   SpO2 96%   Visual Acuity Right Eye Distance:   Left Eye Distance:   Bilateral Distance:    Right Eye Near:   Left Eye Near:    Bilateral Near:     Physical Exam Vitals and nursing note reviewed.  Constitutional:      General: He is not in acute distress.    Appearance: He is well-developed.  HENT:     Head: Normocephalic and atraumatic.     Right Ear: Tympanic membrane normal.     Left Ear: Tympanic membrane normal.     Nose: Nose normal.     Mouth/Throat:     Mouth: Mucous membranes are moist.     Pharynx: Oropharynx is clear.  Eyes:     Conjunctiva/sclera: Conjunctivae normal.  Cardiovascular:     Rate and Rhythm: Normal rate and regular rhythm.     Heart sounds: Normal heart sounds.  Pulmonary:     Effort: Pulmonary effort is normal. No respiratory distress.     Breath sounds: Normal breath sounds.  Abdominal:     Palpations: Abdomen is soft.     Tenderness: There is no abdominal tenderness.  Musculoskeletal:     Cervical back: Neck supple.  Skin:    General: Skin is warm and dry.  Neurological:     Mental Status: He is alert.  Psychiatric:        Mood and Affect: Mood normal.        Behavior: Behavior normal.     UC Treatments / Results  Labs (all labs ordered are listed, but only abnormal results are displayed) Labs Reviewed - No data to display  EKG   Radiology DG Chest 2 View  Result Date: 03/14/2021 CLINICAL DATA:  cough, yellow mucus, and chest congestion x 1 week. He had a tele-visit 1 week ago and a few days from that visit he was  prescribed doxycycline. Pt states he is not better and has some "rattling" in his chest. EXAM: CHEST - 2 VIEW COMPARISON:  Overlapping portions CT abdomen 09/04/2020 FINDINGS: The lateral costophrenic angles are inadvertently omitted. No blunting of the posterior costophrenic angles. Moderate-sized hiatal hernia. Dense 5 mm and separate 3 mm nodules project over the right upper lobe, probably calcified granulomas but technically nonspecific. Heart size within normal limits. No airspace opacity identified. No significant interstitial accentuation noted. IMPRESSION: 1. No findings of pneumonia. 2. 2 small nodules are present in the right upper lobe, the larger 5 mm in diameter. These may well be calcified granulomas better technically nonspecific. Consider follow up CT chest for further characterization. 3. Moderate-sized hiatal hernia. Electronically Signed   By: Van Clines M.D.   On: 03/14/2021 19:53    Procedures Procedures (including critical care time)  Medications Ordered in UC Medications - No data to display  Initial Impression / Assessment and Plan / UC Course  I have reviewed the triage vital signs and the nursing notes.  Pertinent labs & imaging results that were available during my care of the patient were reviewed by me and considered in my medical decision making (see chart for details).   Productive cough, acute sinusitis, 2 lung nodules in right upper lobe.   Chest xray negative for pneumonia but does show 2 small nodules in the right upper lobe; recommended chest CT for further evaluation..  Afebrile, vital signs stable.  Patient has been symptomatic for >1 week.  He  completed a course of doxycycline and has also taken OTC medication without relief.  Treating today with Augmentin.  Instructed patient to follow up with his PCP next week to schedule a chest CT for follow-up of the lung nodules.  ED precautions discussed.  He agrees to plan of care.    Final Clinical  Impressions(s) / UC Diagnoses   Final diagnoses:  Productive cough  Acute non-recurrent maxillary sinusitis  Lung nodules     Discharge Instructions      Your chest xray is does not show any pneumonia.  But it does show 2 small nodules in the right upper lobe.  Please follow-up with your primary care provider next week to schedule a chest CT for further evaluation.  Take the Augmentin as directed.    Go to the emergency department if you have shortness of breath or other concerning symptoms.         ED Prescriptions     Medication Sig Dispense Auth. Provider   amoxicillin-clavulanate (AUGMENTIN) 875-125 MG tablet Take 1 tablet by mouth every 12 (twelve) hours. 14 tablet Sharion Balloon, NP      PDMP not reviewed this encounter.   Sharion Balloon, NP 03/14/21 2002

## 2021-03-31 ENCOUNTER — Other Ambulatory Visit: Payer: Self-pay | Admitting: *Deleted

## 2021-03-31 MED ORDER — SILDENAFIL CITRATE 20 MG PO TABS: ORAL_TABLET | ORAL | 2 refills | Status: DC

## 2021-04-09 ENCOUNTER — Encounter: Payer: Self-pay | Admitting: Family Medicine

## 2021-04-09 ENCOUNTER — Ambulatory Visit: Payer: BC Managed Care – PPO | Admitting: Family Medicine

## 2021-04-09 ENCOUNTER — Other Ambulatory Visit: Payer: Self-pay

## 2021-04-09 VITALS — BP 112/80 | HR 59 | Temp 98.2°F | Ht 73.0 in | Wt 220.1 lb

## 2021-04-09 DIAGNOSIS — R918 Other nonspecific abnormal finding of lung field: Secondary | ICD-10-CM

## 2021-04-09 DIAGNOSIS — R9389 Abnormal findings on diagnostic imaging of other specified body structures: Secondary | ICD-10-CM

## 2021-04-09 LAB — BASIC METABOLIC PANEL
BUN: 11 mg/dL (ref 6–23)
CO2: 27 mEq/L (ref 19–32)
Calcium: 9.7 mg/dL (ref 8.4–10.5)
Chloride: 103 mEq/L (ref 96–112)
Creatinine, Ser: 0.85 mg/dL (ref 0.40–1.50)
GFR: 91.83 mL/min (ref >60.00)
Glucose, Bld: 90 mg/dL (ref 70–99)
Potassium: 4.5 mEq/L (ref 3.5–5.1)
Sodium: 136 mEq/L (ref 135–145)

## 2021-04-09 NOTE — Progress Notes (Signed)
Joshua Marte T. Kamariyah Timberlake, MD, [place] at [facility] [name], [address]  Phone: [phone]  FAX: [phone]  Joshua Lang - 65 y.o. male  MRN [id number]  Date of Birth: [birth year]  Date: 04/09/2021  PCP: Owens Loffler, MD  Referral: Owens Loffler, MD  Chief Complaint  Patient presents with   Follow-up    Urgent Care Visit on 03/14/2021 [id number] X-Ray-Needs CT scan for lung nodule    This visit occurred during the SARS-CoV-2 public health emergency.  Safety protocols were in place, including screening questions prior to the visit, additional usage of staff PPE, and extensive cleaning of exam room while observing appropriate contact time as indicated for disinfecting solutions.   Subjective:   Joshua Lang is a 65 y.o. very pleasant male patient with Body mass index is 29.04 kg/m. who presents with the following:  [name] is here for follow-up from urgent care.  They found to pulmonary nodules on the right side with recommendation for CT by radiology.  Feeling better some.  Two rounds of antibx, then went to uc.  Found two nodules I his lung.  At this point, his respiratory infection seems to have improved.  He has been physically down some over the last 2 months and has been less active and feels like he is gotten out of shape some.  At this point, his breathing is returning to normal.   Urgent care visit, March 14, 2021. Joshua Lang is a 65 y.o. male.  Patient presents with >1 week history of cough productive of yellow sputum, chest congestion, "rattling" in his chest.  He denies fever, chills, shortness of breath, or other symptoms.  He had an E-visit on 03/04/2021; was diagnosed with viral URI with cough; treated symptomatically.  He contacted his PCP on 03/06/2021 because he was not improving and was prescribed doxycycline, which he completed.  He has also taken Mucinex.  His medical history includes allergic  rhinitis, arthritis, kidney stones, enlarged prostate, GERD, hypercholesterolemia.  Review of Systems is noted in the HPI, as appropriate  Objective:   BP 112/80   Pulse (!) 59   Temp 98.2 F (36.8 C) (Temporal)   Ht 6\' 1"  (1.854 m)   Wt 220 lb 2 oz (99.8 kg)   SpO2 96%   BMI 29.04 kg/m   GEN: No acute distress; alert,appropriate. PULM: Breathing comfortably in no respiratory distress PSYCH: Normally interactive.  PULM: Normal respiratory rate, no accessory muscle use. No wheezes, crackles or rhonchi   Laboratory and Imaging Data: DG Chest 2 View  Result Date: 03/14/2021 CLINICAL DATA:  cough, yellow mucus, and chest congestion x 1 week. He had a tele-visit 1 week ago and a few days from that visit he was prescribed doxycycline. Pt states he is not better and has some "rattling" in his chest. EXAM: CHEST - 2 VIEW COMPARISON:  Overlapping portions CT abdomen 09/04/2020 FINDINGS: The lateral costophrenic angles are inadvertently omitted. No blunting of the posterior costophrenic angles. Moderate-sized hiatal hernia. Dense 5 mm and separate 3 mm nodules project over the right upper lobe, probably calcified granulomas but technically nonspecific. Heart size within normal limits. No airspace opacity identified. No significant interstitial accentuation noted. IMPRESSION: 1. No findings of pneumonia. 2. 2 small nodules are present in the right upper lobe, the larger 5 mm in diameter. These may well be calcified granulomas better technically nonspecific. Consider follow up CT chest for further characterization. 3. Moderate-sized  hiatal hernia. Electronically Signed   By: Joshua Lang M.D.   On: 03/14/2021 19:53     Assessment and Plan:     ICD-10-CM   1. Pulmonary nodules/lesions, multiple  S49.6 Basic metabolic panel    2. Abnormal chest x-ray  P59.16 Basic metabolic panel    3. Abnormal findings on diagnostic imaging of lung  R91.8 CT Chest W Contrast     Clinically improving, 2  nodules are visualized on the chest x-ray by radiology, and they recommended follow-up chest CT.  Also reviewed these with the patient myself.  Obtain a CT of the chest with contrast to better evaluate pulmonary nodules and to assess for potential neoplasm.   Orders Placed This Encounter  Procedures   CT Chest W Contrast   Basic metabolic panel    Follow-up: No follow-ups on file.  Dr[employer] One speech-to-text software was used for transcription in this dictation.  Possible transcriptional errors can occur using Editor, commissioning.   Signed,  Joshua Lang. Joshua Wedin, MD   Outpatient Encounter Medications as of 04/09/2021  Medication Sig   Ascorbic Acid (VITAMIN C WITH ROSE HIPS) 500 MG tablet Take 500 mg by mouth daily.   aspirin 81 MG chewable tablet Chew 81 mg by mouth at bedtime.    B Complex-C (B-COMPLEX WITH VITAMIN C) tablet Take 1 tablet by mouth daily.   Cholecalciferol (VITAMIN D3) 50 MCG (2000 UT) TABS Take 1 tablet by mouth daily.   Cranberry 500 MG TABS Take 1 tablet by mouth in the morning and at bedtime.   Doxylamine Succinate, Sleep, (SLEEP AID PO) Take 1 tablet by mouth at bedtime as needed.   fluocinonide cream (LIDEX) 0.05 % Apply bid as directed   Methylcellulose, Laxative, (FIBER THERAPY PO) Take by mouth as needed.   Multiple Vitamin (MULTIVITAMIN WITH MINERALS) TABS tablet Take 1 tablet by mouth 2 (two) times daily.   Omega-3 1000 MG CAPS Take 1 capsule by mouth daily.   OVER THE COUNTER MEDICATION Take 4 tablets by mouth daily. TOTAL GREENS   Probiotic Product (SUPER PROBIOTIC PO) Take 1 tablet by mouth daily.   RESVERATROL PO Take 500 mg by mouth 2 (two) times daily.    Saw Palmetto 450 MG CAPS Take 450 mg by mouth 2 (two) times daily.    sildenafil (REVATIO) 20 MG tablet Take 2 to 5 tablets by mouth 30 minutes prior to intercourse.   simvastatin (ZOCOR) 40 MG tablet TAKE 1 TABLET(40 MG) BY MOUTH AT BEDTIME   tamsulosin (FLOMAX) 0.4 MG CAPS capsule Take 1  capsule (0.4 mg total) by mouth daily.   Zinc 50 MG TABS Take 1 tablet by mouth daily.   [DISCONTINUED] amoxicillin-clavulanate (AUGMENTIN) 875-125 MG tablet Take 1 tablet by mouth every 12 (twelve) hours.   [DISCONTINUED] Melatonin 10 MG TABS Take 1 tablet by mouth at bedtime as needed.   No facility-administered encounter medications on file as of 04/09/2021.

## 2021-04-22 NOTE — Addendum Note (Signed)
Addended by: Owens Loffler on: 04/22/2021 02:54 PM   Modules accepted: Orders

## 2021-05-07 ENCOUNTER — Ambulatory Visit
Admission: RE | Admit: 2021-05-07 | Discharge: 2021-05-07 | Disposition: A | Discharge status: Home / Self Care | Payer: BC Managed Care – PPO | Source: Ambulatory Visit | Attending: Family Medicine | Admitting: Family Medicine

## 2021-05-07 ENCOUNTER — Other Ambulatory Visit: Payer: Self-pay

## 2021-05-07 DIAGNOSIS — R918 Other nonspecific abnormal finding of lung field: Secondary | ICD-10-CM

## 2021-05-07 DIAGNOSIS — R9389 Abnormal findings on diagnostic imaging of other specified body structures: Secondary | ICD-10-CM

## 2021-05-13 ENCOUNTER — Telehealth: Payer: Self-pay | Admitting: Family Medicine

## 2021-05-13 DIAGNOSIS — R9389 Abnormal findings on diagnostic imaging of other specified body structures: Secondary | ICD-10-CM

## 2021-05-13 DIAGNOSIS — R918 Other nonspecific abnormal finding of lung field: Secondary | ICD-10-CM

## 2021-05-13 DIAGNOSIS — I2510 Atherosclerotic heart disease of native coronary artery without angina pectoris: Secondary | ICD-10-CM

## 2021-05-13 NOTE — Telephone Encounter (Signed)
Mr. Joshua Lang called in and wanted to know if he can get a call for his CT scan he had done on 12.28.22. wanted to let Butch Penny know she can leave a message as well if not able to pick up

## 2021-05-13 NOTE — Telephone Encounter (Signed)
Spoke with Mr. [name] and relay result note message from Dr. Lorelei Pont on his Chest CT results:   Joshua Lang, basically you have a lung nodule that we are going to need to follow to make sure that it is stable.  There is also some calcification in the heart that they saw.  None of this is emergent.   We will need to set up a follow-up CT of your lungs.  I think that we should probably get you to see Cardiology non-urgently for evaluation, too.  For now, I would keep taking your aspirin and cholesterol medication.  Patient is agreeable with follow up CT (3 to 6 months) and Cardiology referral.  FYI to Dr. Lorelei Pont.

## 2021-05-18 DIAGNOSIS — I2510 Atherosclerotic heart disease of native coronary artery without angina pectoris: Secondary | ICD-10-CM

## 2021-05-18 HISTORY — DX: Atherosclerotic heart disease of native coronary artery without angina pectoris: I25.10

## 2021-05-18 NOTE — Addendum Note (Signed)
Addended by: Owens Loffler on: 05/18/2021 09:22 AM   Modules accepted: Orders

## 2021-06-06 ENCOUNTER — Other Ambulatory Visit: Payer: Self-pay

## 2021-06-06 ENCOUNTER — Ambulatory Visit: Payer: BC Managed Care – PPO | Admitting: Cardiology

## 2021-06-06 ENCOUNTER — Encounter: Payer: Self-pay | Admitting: Cardiology

## 2021-06-06 VITALS — BP 120/62 | HR 61 | Ht 73.0 in | Wt 219.0 lb

## 2021-06-06 DIAGNOSIS — E7800 Pure hypercholesterolemia, unspecified: Secondary | ICD-10-CM | POA: Diagnosis not present

## 2021-06-06 DIAGNOSIS — I2510 Atherosclerotic heart disease of native coronary artery without angina pectoris: Secondary | ICD-10-CM

## 2021-06-06 MED ORDER — ATORVASTATIN CALCIUM 40 MG PO TABS: 40.0000 mg | ORAL_TABLET | Freq: Every day | ORAL | 5 refills | Status: DC

## 2021-06-06 NOTE — Patient Instructions (Signed)
Medication Instructions:    Your physician has recommended you make the following change in your medication:    STOP taking Simvastatin.  2.    START taking Atorvastatin 40 MG once a day.  *If you need a refill on your cardiac medications before your next appointment, please call your pharmacy*   Lab Work: None ordered If you have labs (blood work) drawn today and your tests are completely normal, you will receive your results only by: [place] (if you have MyChart) OR A paper copy in the mail If you have any lab test that is abnormal or we need to change your treatment, we will call you to review the results.   Testing/Procedures:  Your physician has requested that you have an echocardiogram. Echocardiography is a painless test that uses sound waves to create images of your heart. It provides your doctor with information about the size and shape of your heart and how well your hearts chambers and valves are working. This procedure takes approximately one hour. There are no restrictions for this procedure.    Follow-Up: At [facility], you and your health needs are our priority.  As part of our continuing mission to provide you with exceptional heart care, we have created designated Provider Care Teams.  These Care Teams include your primary Cardiologist (physician) and Advanced Practice Providers (APPs -  Physician Assistants and Nurse Practitioners) who all work together to provide you with the care you need, when you need it.  We recommend signing up for the patient portal called "MyChart".  Sign up information is provided on this After Visit Summary.  MyChart is used to connect with patients for Virtual Visits (Telemedicine).  Patients are able to view lab/test results, encounter notes, upcoming appointments, etc.  Non-urgent messages can be sent to your provider as well.   To learn more about what you can do with MyChart, go to [email or web address].    Your next  appointment:   6 week(s)  The format for your next appointment:   In Person  Provider:   You may see Kate Sable, MD or one of the following Advanced Practice Providers on your designated Care Team:   Murray Hodgkins, NP Christell Faith, PA-C Cadence Kathlen Mody, [place]    Other Instructions

## 2021-06-06 NOTE — Progress Notes (Signed)
Cardiology Office Note:    Date:  06/06/2021   ID:  Joshua Lang, DOB 09/17/1955, MRN [id number] PCP:  Owens Loffler, MD   [facility] Providers Cardiologist:  None     Referring MD: Owens Loffler, MD   Chief Complaint  Patient presents with   New Patient (Initial Visit)    Referred by [facility] for Coronary artery disease involving native coronary artery of native heart without angina pectoris. Meds reviewed verbally with patient.     History of Present Illness:    Joshua Lang is a 66 y.o. male with a hx of hyperlipidemia who presents due to CAD.  Patient diagnosed with a pulmonary nodule, chest CT was recommended.  Coronary artery calcifications were noted on noncontrast chest CT dated 04/2021.  He denies chest pain or shortness of breath.  Denies any history of heart disease or family history of MI.  He feels well, exercises frequently without any symptoms of chest pain or shortness of breath.  Past Medical History:  Diagnosis Date   Allergic rhinitis    Arthritis    severe R > L hip   Colonic polyp    Complication of anesthesia    WITH 1ST HERNIA SURGERY PT STATES HE WAS CHOKING WITH TUBE IN   ED (erectile dysfunction)    Enlarged prostate    Moderately   GERD (gastroesophageal reflux disease)    History of kidney stones    H/O   Hypercholesteremia     Past Surgical History:  Procedure Laterality Date   COLONOSCOPY  2015   HERNIA REPAIR Left 1990's   X2   INGUINAL HERNIA REPAIR Right 03/24/2016   Procedure: HERNIA REPAIR INGUINAL ADULT;  Surgeon: Robert Bellow, MD;  Location: [facility] ORS;  Service: General;  Laterality: Right;   JOINT REPLACEMENT Bilateral    THR   SHOULDER ARTHROSCOPY WITH OPEN ROTATOR CUFF REPAIR Right 03/22/2018   Procedure: SHOULDER ARTHROSCOPY WITH OPEN ROTATOR CUFF REPAIR;  Surgeon: Corky Mull, MD;  Location: [facility] OR[facility];  Service: Orthopedics;  Laterality: Right;   SHOULDER OPEN ROTATOR CUFF REPAIR  2014   Hooten, partial RTC tear  with probably SAD, possible DCE   SHOULDER SURGERY  2009   open, 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, (Jim Hooten)   TOTAL HIP ARTHROPLASTY  09/2009   (Hooten)   TOTAL HIP ARTHROPLASTY Left 05/20/2015   Procedure: TOTAL HIP ARTHROPLASTY;  Surgeon: Dereck Leep, MD;  Location: [facility] ORS;  Service: Orthopedics;  Laterality: Left;    Current Medications: Current Meds  Medication Sig   Ascorbic Acid (VITAMIN C WITH ROSE HIPS) 500 MG tablet Take 500 mg by mouth daily.   aspirin 81 MG chewable tablet Chew 81 mg by mouth at bedtime.    atorvastatin (LIPITOR) 40 MG tablet Take 1 tablet (40 mg total) by mouth daily.   B Complex-C (B-COMPLEX WITH VITAMIN C) tablet Take 1 tablet by mouth daily.   Cholecalciferol (VITAMIN D3) 50 MCG (2000 UT) TABS Take 1 tablet by mouth daily.   Cranberry 500 MG TABS Take 1 tablet by mouth in the morning and at bedtime.   Doxylamine Succinate, Sleep, (SLEEP AID PO) Take 1 tablet by mouth at bedtime as needed.   fluocinonide cream (LIDEX) 0.05 % Apply bid as directed   Methylcellulose, Laxative, (FIBER THERAPY PO) Take by mouth as needed.   Multiple Vitamin (MULTIVITAMIN WITH MINERALS) TABS tablet Take 1 tablet by mouth 2 (two) times daily.   Omega-3 1000 MG CAPS Take 1  capsule by mouth daily.   OVER THE COUNTER MEDICATION Take 4 tablets by mouth daily. TOTAL GREENS   Probiotic Product (SUPER PROBIOTIC PO) Take 1 tablet by mouth daily.   RESVERATROL PO Take 500 mg by mouth 2 (two) times daily.    Saw Palmetto 450 MG CAPS Take 450 mg by mouth 2 (two) times daily.    sildenafil (REVATIO) 20 MG tablet Take 2 to 5 tablets by mouth 30 minutes prior to intercourse.   tamsulosin (FLOMAX) 0.4 MG CAPS capsule Take 1 capsule (0.4 mg total) by mouth daily.   Zinc 50 MG TABS Take 1 tablet by mouth daily.   [DISCONTINUED] simvastatin (ZOCOR) 40 MG tablet TAKE 1 TABLET(40 MG) BY MOUTH AT BEDTIME     Allergies:   Terbinafine and related; Tramadol; Antihistamines, chlorpheniramine-type; and  Oxycodone   Social History   Socioeconomic History   Marital status: Single    Spouse name: Not on file   Number of children: Not on file   Years of education: Not on file   Highest education level: Not on file  Occupational History   Occupation: [employer], Environmental manager, head    Comment: Doctorate  Tobacco Use   Smoking status: Never   Smokeless tobacco: Never  Vaping Use   Vaping Use: Never used  Substance and Sexual Activity   Alcohol use: Yes    Comment: occassional beer   Drug use: No   Sexual activity: Not on file  Other Topics Concern   Not on file  Social History Narrative   ** Merged History Encounter **       Social Determinants of Health   Financial Resource Strain: Not on file  Food Insecurity: Not on file  Transportation Needs: Not on file  Physical Activity: Not on file  Stress: Not on file  Social Connections: Not on file     Family History: The patient's family history includes Migraines in his sister; Prostate cancer in his father.  ROS:   Please see the history of present illness.     All other systems reviewed and are negative.  EKGs/Labs/Other Studies Reviewed:    The following studies were reviewed today:   EKG:  EKG is  ordered today.  The ekg ordered today demonstrates normal sinus rhythm,  Recent Labs: 07/29/2020: ALT 16; Hemoglobin 13.7; Platelets 163.0 04/09/2021: BUN 11; Creatinine, Ser 0.85; Potassium 4.5; Sodium 136  Recent Lipid Panel    Component Value Date/Time   CHOL 154 07/29/2020 0955   TRIG 59.0 07/29/2020 0955   HDL 52.70 07/29/2020 0955   CHOLHDL 3 07/29/2020 0955   VLDL 11.8 07/29/2020 0955   LDLCALC 89 07/29/2020 0955     Risk Assessment/Calculations:          Physical Exam:    VS:  BP 120/62 (BP Location: Left Arm, Patient Position: Sitting, Cuff Size: Normal)    Pulse 61    Ht 6\' 1"  (1.854 m)    Wt 219 lb (99.3 kg)    SpO2 98%    BMI 28.89 kg/m     Wt Readings from Last 3 Encounters:  06/06/21 219  lb (99.3 kg)  04/09/21 220 lb 2 oz (99.8 kg)  03/04/21 218 lb (98.9 kg)     GEN:  Well nourished, well developed in no acute distress HEENT: Normal NECK: No JVD; No carotid bruits LYMPHATICS: No lymphadenopathy CARDIAC: RRR, no murmurs, rubs, gallops RESPIRATORY:  Clear to auscultation without rales, wheezing or rhonchi  ABDOMEN: Soft, non-tender,  non-distended MUSCULOSKELETAL:  No edema; No deformity  SKIN: Warm and dry NEUROLOGIC:  Alert and oriented x 3 PSYCHIATRIC:  Normal affect   ASSESSMENT:    1. Coronary artery disease involving native coronary artery of native heart without angina pectoris   2. Pure hypercholesterolemia    PLAN:    In order of problems listed above:  Three-vessel coronary artery calcification on noncontrast chest CT.  Denies chest pain.  Obtain echocardiogram.  Stop simvastatin.  Start Lipitor 40 mg daily, aspirin 81 mg daily. LDL goal less than 70.  Start Lipitor 40.  Stop simvastatin.  Follow-up after echocardiogram      Medication Adjustments/Labs and Tests Ordered: Current medicines are reviewed at length with the patient today.  Concerns regarding medicines are outlined above.  Orders Placed This Encounter  Procedures   EKG 12-Lead   ECHOCARDIOGRAM COMPLETE   Meds ordered this encounter  Medications   atorvastatin (LIPITOR) 40 MG tablet    Sig: Take 1 tablet (40 mg total) by mouth daily.    Dispense:  30 tablet    Refill:  5    Patient Instructions  Medication Instructions:    Your physician has recommended you make the following change in your medication:    STOP taking Simvastatin.  2.    START taking Atorvastatin 40 MG once a day.  *If you need a refill on your cardiac medications before your next appointment, please call your pharmacy*   Lab Work: None ordered If you have labs (blood work) drawn today and your tests are completely normal, you will receive your results only by: [place] (if you have MyChart)  OR A paper copy in the mail If you have any lab test that is abnormal or we need to change your treatment, we will call you to review the results.   Testing/Procedures:  Your physician has requested that you have an echocardiogram. Echocardiography is a painless test that uses sound waves to create images of your heart. It provides your doctor with information about the size and shape of your heart and how well your hearts chambers and valves are working. This procedure takes approximately one hour. There are no restrictions for this procedure.    Follow-Up: At [facility], you and your health needs are our priority.  As part of our continuing mission to provide you with exceptional heart care, we have created designated Provider Care Teams.  These Care Teams include your primary Cardiologist (physician) and Advanced Practice Providers (APPs -  Physician Assistants and Nurse Practitioners) who all work together to provide you with the care you need, when you need it.  We recommend signing up for the patient portal called "MyChart".  Sign up information is provided on this After Visit Summary.  MyChart is used to connect with patients for Virtual Visits (Telemedicine).  Patients are able to view lab/test results, encounter notes, upcoming appointments, etc.  Non-urgent messages can be sent to your provider as well.   To learn more about what you can do with MyChart, go to [email or web address].    Your next appointment:   6 week(s)  The format for your next appointment:   In Person  Provider:   You may see Kate Sable, MD or one of the following Advanced Practice Providers on your designated Care Team:   Murray Hodgkins, NP Christell Faith, PA-C Cadence Kathlen Mody, [place]    Other Instructions     Signed, Kate Sable, MD  06/06/2021 10:54 AM  [facility]

## 2021-06-27 ENCOUNTER — Other Ambulatory Visit: Payer: Self-pay

## 2021-06-27 ENCOUNTER — Ambulatory Visit (INDEPENDENT_AMBULATORY_CARE_PROVIDER_SITE_OTHER): Payer: BC Managed Care – PPO

## 2021-06-27 DIAGNOSIS — I2510 Atherosclerotic heart disease of native coronary artery without angina pectoris: Secondary | ICD-10-CM | POA: Diagnosis not present

## 2021-06-27 LAB — ECHOCARDIOGRAM COMPLETE
AR max vel: 3.25 cm2
AV Area VTI: 2.74 cm2
AV Area mean vel: 3.01 cm2
AV Mean grad: 4 mmHg
AV Peak grad: 6.1 mmHg
AV Vena cont: 0.4 cm
Ao pk vel: 1.23 m/s
Area-P 1/2: 3.07 cm2
Calc EF: 61.8 %
P 1/2 time: 532 msec
S' Lateral: 2.9 cm
Single Plane A2C EF: 60.7 %
Single Plane A4C EF: 62 %

## 2021-07-18 ENCOUNTER — Ambulatory Visit: Payer: BC Managed Care – PPO | Admitting: Cardiology

## 2021-07-30 ENCOUNTER — Encounter: Payer: Self-pay | Admitting: Family Medicine

## 2021-07-30 ENCOUNTER — Other Ambulatory Visit: Payer: Self-pay

## 2021-07-30 ENCOUNTER — Ambulatory Visit (INDEPENDENT_AMBULATORY_CARE_PROVIDER_SITE_OTHER): Payer: BC Managed Care – PPO | Admitting: Family Medicine

## 2021-07-30 VITALS — BP 100/70 | HR 63 | Temp 98.1°F | Ht 73.0 in | Wt 220.0 lb

## 2021-07-30 DIAGNOSIS — Z131 Encounter for screening for diabetes mellitus: Secondary | ICD-10-CM | POA: Diagnosis not present

## 2021-07-30 DIAGNOSIS — N400 Enlarged prostate without lower urinary tract symptoms: Secondary | ICD-10-CM

## 2021-07-30 DIAGNOSIS — Z79899 Other long term (current) drug therapy: Secondary | ICD-10-CM

## 2021-07-30 DIAGNOSIS — E7800 Pure hypercholesterolemia, unspecified: Secondary | ICD-10-CM | POA: Diagnosis not present

## 2021-07-30 DIAGNOSIS — Z0000 Encounter for general adult medical examination without abnormal findings: Secondary | ICD-10-CM

## 2021-07-30 LAB — CBC WITH DIFFERENTIAL/PLATELET
Basophils Absolute: 0 10*3/uL (ref 0.0–0.1)
Basophils Relative: 0.2 % (ref 0.0–3.0)
Eosinophils Absolute: 0.1 10*3/uL (ref 0.0–0.7)
Eosinophils Relative: 1.4 % (ref 0.0–5.0)
HCT: 43.8 % (ref 39.0–52.0)
Hemoglobin: 14.7 g/dL (ref 13.0–17.0)
Lymphocytes Relative: 29.1 % (ref 12.0–46.0)
Lymphs Abs: 1.2 10*3/uL (ref 0.7–4.0)
MCHC: 33.6 g/dL (ref 30.0–36.0)
MCV: 94.2 fl (ref 78.0–100.0)
Monocytes Absolute: 0.4 10*3/uL (ref 0.1–1.0)
Monocytes Relative: 10.5 % (ref 3.0–12.0)
Neutro Abs: 2.4 10*3/uL (ref 1.4–7.7)
Neutrophils Relative %: 58.8 % (ref 43.0–77.0)
Platelets: 176 10*3/uL (ref 150.0–400.0)
RBC: 4.65 Mil/uL (ref 4.22–5.81)
RDW: 13.6 % (ref 11.5–15.5)
WBC: 4.1 10*3/uL (ref 4.0–10.5)

## 2021-07-30 LAB — HEMOGLOBIN A1C: Hgb A1c MFr Bld: 5.8 % (ref 4.6–6.5)

## 2021-07-30 LAB — BASIC METABOLIC PANEL
BUN: 13 mg/dL (ref 6–23)
CO2: 30 mEq/L (ref 19–32)
Calcium: 10.1 mg/dL (ref 8.4–10.5)
Chloride: 104 mEq/L (ref 96–112)
Creatinine, Ser: 0.89 mg/dL (ref 0.40–1.50)
GFR: 90.37 mL/min (ref >60.00)
Glucose, Bld: 94 mg/dL (ref 70–99)
Potassium: 4.9 mEq/L (ref 3.5–5.1)
Sodium: 139 mEq/L (ref 135–145)

## 2021-07-30 LAB — LIPID PANEL
Cholesterol: 143 mg/dL (ref 0–200)
HDL: 56.3 mg/dL (ref >39.00)
LDL Cholesterol: 71 mg/dL (ref 0–99)
NonHDL: 86.8
Total CHOL/HDL Ratio: 3
Triglycerides: 80 mg/dL (ref 0.0–149.0)
VLDL: 16 mg/dL (ref 0.0–40.0)

## 2021-07-30 LAB — HEPATIC FUNCTION PANEL
ALT: 29 U/L (ref 0–53)
AST: 24 U/L (ref 0–37)
Albumin: 4.6 g/dL (ref 3.5–5.2)
Alkaline Phosphatase: 53 U/L (ref 39–117)
Bilirubin, Direct: 0.1 mg/dL (ref 0.0–0.3)
Total Bilirubin: 0.6 mg/dL (ref 0.2–1.2)
Total Protein: 7.2 g/dL (ref 6.0–8.3)

## 2021-07-30 MED ORDER — ATORVASTATIN CALCIUM 40 MG PO TABS: 40.0000 mg | ORAL_TABLET | Freq: Every day | ORAL | 3 refills | Status: DC

## 2021-07-30 MED ORDER — FLUOCINONIDE 0.05 % EX CREA: TOPICAL_CREAM | CUTANEOUS | 3 refills | Status: DC

## 2021-07-30 MED ORDER — TAMSULOSIN HCL 0.4 MG PO CAPS: 0.4000 mg | ORAL_CAPSULE | Freq: Every day | ORAL | 3 refills | Status: DC

## 2021-07-30 MED ORDER — SILDENAFIL CITRATE 20 MG PO TABS: ORAL_TABLET | ORAL | 2 refills | Status: DC

## 2021-07-30 NOTE — Progress Notes (Signed)
? ? ?Joshua Cuff T. Avia Merkley, MD, [facility] ?Therapist, music at [facility] ?[address] ?[place], [address] ? ?Phone: [phone]  FAX: [phone] ? ?Joshua Lang - 66 y.o. male  MRN [id number]  Date of Birth: 06-06-1955 ? ?Date: 07/30/2021  PCP: Owens Loffler, MD  Referral: Owens Loffler, MD ? ?Chief Complaint  ?Patient presents with  ? Annual Exam  ? ? ?This visit occurred during the SARS-CoV-2 public health emergency.  Safety protocols were in place, including screening questions prior to the visit, additional usage of staff PPE, and extensive cleaning of exam room while observing appropriate contact time as indicated for disinfecting solutions.  ? ?Patient Care Team: ?Owens Loffler, MD as PCP - General ?Owens Loffler, MD as Consulting Physician (Family Medicine) ?Robert Bellow, MD (General Surgery) ?Kate Sable, MD as Consulting Physician (Cardiology) ?Subjective:  ? ?Joshua Lang is a 66 y.o. pleasant patient who presents with the following: ? ?Preventative Health Maintenance Visit: ? ?Health Maintenance Summary Reviewed and updated, unless pt declines services. ? ?Tobacco History Reviewed. ?Alcohol: No concerns, no excessive use ?Exercise Habits: Some activity, rec at least 30 mins 5 times a week -he is still pretty active, but he is not as active as he used to be when he was younger. ?STD concerns: no risk or activity to increase risk ?Drug Use: None ? ?Bivalent Covid ? ?Back sore a few days.  He has been doing a lot of work on his house in town. ? ?Mom is 32 years old.  ? ?Switched to Lipitor by cardiology. ?He continues to take a baby aspirin daily. ? ? ? ?Health Maintenance  ?Topic Date Due  ? COVID-19 Vaccine (4 - Booster for Pfizer series) 06/21/2020  ? COLONOSCOPY (Pts 45-64yr Insurance coverage will need to be confirmed)  12/21/2024  ? TETANUS/TDAP  12/31/2025  ? INFLUENZA VACCINE  Completed  ? Hepatitis C Screening  Completed  ? HIV Screening   Completed  ? Zoster Vaccines- Shingrix  Completed  ? HPV VACCINES  Aged Out  ? ?Immunization History  ?Administered Date(s) Administered  ? Influenza Whole 02/08/2009, 02/07/2012  ? Influenza, Seasonal, Injecte, Preservative Fre 01/08/2014  ? Influenza,inj,Quad PF,6+ Mos 01/09/2015, 01/01/2016, 01/14/2017, 02/03/2018, 01/10/2019, 04/23/2020, 01/21/2021  ? PFIZER(Purple Top)SARS-COV-2 Vaccination 07/27/2019, 08/17/2019, 04/26/2020  ? Pneumococcal Conjugate-13 04/23/2015  ? Pneumococcal Polysaccharide-23 03/28/2016  ? Td 02/13/2009  ? Tdap 01/01/2016  ? Zoster Recombinat (Shingrix) 01/14/2017, 03/23/2017  ? Zoster, Live 06/22/2013  ? ?Patient Active Problem List  ? Diagnosis Date Noted  ? Coronary artery disease involving native coronary artery of native heart without angina pectoris 05/18/2021  ? Benign prostatic hyperplasia without lower urinary tract symptoms 03/10/2017  ? Hernia of abdominal cavity 01/23/2016  ? S/P total hip arthroplasty 05/20/2015  ? ALLERGIC RHINITIS 02/17/2009  ? 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 UNEQUAL LEG LENGTH 02/13/2009  ? Sleep apnea 02/13/2009  ? COLONIC POLYPS 10/30/2008  ? HYPERCHOLESTEROLEMIA 10/30/2008  ? NEPHROLITHIASIS 10/30/2008  ? DEGENERATIVE JOINT DISEASE, HIPS 10/30/2008  ? ? ?Past Medical History:  ?Diagnosis Date  ? Allergic rhinitis   ? Arthritis   ? severe R > L hip  ? Colonic polyp   ? Complication of anesthesia   ? WITH 1ST HERNIA SURGERY PT STATES HE WAS CHOKING WITH TUBE IN  ? ED (erectile dysfunction)   ? Enlarged prostate   ? Moderately  ? GERD (gastroesophageal reflux disease)   ? History of kidney stones   ? H/O  ?  Hypercholesteremia   ? ? ?Past Surgical History:  ?Procedure Laterality Date  ? COLONOSCOPY  2015  ? HERNIA REPAIR Left 1990's  ? X2  ? INGUINAL HERNIA REPAIR Right 03/24/2016  ? Procedure: HERNIA REPAIR INGUINAL ADULT;  Surgeon: Robert Bellow, MD;  Location: [facility] ORS;  Service: General;  Laterality: Right;  ? JOINT REPLACEMENT Bilateral    ? THR  ? SHOULDER ARTHROSCOPY WITH OPEN ROTATOR CUFF REPAIR Right 03/22/2018  ? Procedure: SHOULDER ARTHROSCOPY WITH OPEN ROTATOR CUFF REPAIR;  Surgeon: Corky Mull, MD;  Location: [facility] ORS;  Service: Orthopedics;  Laterality: Right;  ? SHOULDER OPEN ROTATOR CUFF REPAIR  2014  ? Hooten, partial RTC tear with probably SAD, possible DCE  ? SHOULDER SURGERY  2009  ? open, 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, Clair Gulling Hooten)  ? TOTAL HIP ARTHROPLASTY  09/2009  ? (Hooten)  ? TOTAL HIP ARTHROPLASTY Left 05/20/2015  ? Procedure: TOTAL HIP ARTHROPLASTY;  Surgeon: Dereck Leep, MD;  Location: [facility] ORS;  Service: Orthopedics;  Laterality: Left;  ? ? ?Family History  ?Problem Relation Age of Onset  ? Prostate cancer Father   ? Migraines Sister   ? ? ?Past Medical History, Surgical History, Social History, Family History, Problem List, Medications, and Allergies have been reviewed and updated if relevant. ? ?Review of Systems: Pertinent positives are listed above.  Otherwise, a full 14 point review of systems has been done in full and it is negative except where it is noted positive. ? ?Objective:  ? ?BP 100/70   Pulse 63   Temp 98.1 ?F (36.7 ?C) (Oral)   Ht '6\' 1"'$  (1.854 m)   Wt 220 lb (99.8 kg)   SpO2 96%   BMI 29.03 kg/m?  ?Ideal Body Weight: Weight in (lb) to have BMI = 25: 189.1 ? ?Ideal Body Weight: Weight in (lb) to have BMI = 25: 189.1 ?No results found. ? ?  07/30/2021  ? 11:26 AM 07/29/2020  ?  9:09 AM 07/26/2019  ?  9:19 AM 07/20/2018  ?  9:25 AM 07/14/2017  ?  9:10 AM  ?Depression screen PHQ 2/9  ?Decreased Interest 0 0 0 0 0  ?Down, Depressed, Hopeless 0 0 0 0 0  ?PHQ - 2 Score 0 0 0 0 0  ? ? ? ?GEN: well developed, well nourished, no acute distress ?Eyes: conjunctiva and lids normal, PERRLA, EOMI ?ENT: TM clear, nares clear, oral exam WNL ?Neck: supple, no lymphadenopathy, no thyromegaly, no JVD ?Pulm: clear to auscultation and percussion, respiratory effort normal ?CV: regular rate and rhythm, S1-S2, no murmur, rub or gallop, no  bruits, peripheral pulses normal and symmetric, no cyanosis, clubbing, edema or varicosities ?GI: soft, non-tender; no hepatosplenomegaly, masses; active bowel sounds all quadrants ?GU: deferred ?Lymph: no cervical, axillary or inguinal adenopathy ?MSK: gait normal, muscle tone and strength WNL, no joint swelling, effusions, discoloration, crepitus  ?SKIN: clear, good turgor, color WNL, no rashes, lesions, or ulcerations ?Neuro: normal mental status, normal strength, sensation, and motion ?Psych: alert; oriented to person, place and time, normally interactive and not anxious or depressed in appearance. ? ?All labs reviewed with patient. ? ? ?Assessment and Plan:  ? ?  ICD-10-CM   ?1. Healthcare maintenance  Z00.00   ?  ?2. HYPERCHOLESTEROLEMIA  E78.00 Lipid panel  ?  ?3. Benign prostatic hyperplasia without lower urinary tract symptoms  N40.0 PSA, Total with Reflex to PSA, Free  ?  ?4. Screening for diabetes mellitus  E99.3 Basic metabolic panel  ?  Hemoglobin A1c  ?  ?5. Encounter for long-term current use of medication  Z79.899 CBC with Differential/Platelet  ?  Hepatic function panel  ?  ? ?Joshua Lang is generally doing well.  He is active for age.  He has always been very compliant.  He is doing well in his retirement. ? ?I am going to check some baseline labs today. ? ?Health Maintenance Exam: The patient's preventative maintenance and recommended screening tests for an annual wellness exam were reviewed in full today. ?Brought up to date unless services declined. ? ?Counselled on the importance of diet, exercise, and its role in overall health and mortality. ?The patient's FH and SH was reviewed, including their home life, tobacco status, and drug and alcohol status. ? ?Follow-up in 1 year for physical exam or additional follow-up below. ? ?Follow-up: No follow-ups on file. ?Or follow-up in 1 year if not noted. ? ?Meds ordered this encounter  ?Medications  ? fluocinonide cream (LIDEX) 0.05 %  ?  Sig: Apply bid as  directed  ?  Dispense:  60 g  ?  Refill:  3  ? tamsulosin (FLOMAX) 0.4 MG CAPS capsule  ?  Sig: Take 1 capsule (0.4 mg total) by mouth daily.  ?  Dispense:  90 capsule  ?  Refill:  3  ? atorvastatin (LIPITOR) 40 MG tab

## 2021-07-30 NOTE — Patient Instructions (Signed)
Bivalent Covid vaccine. ?

## 2021-07-31 LAB — PSA, TOTAL WITH REFLEX TO PSA, FREE: PSA, Total: 0.6 ng/mL (ref <=4.0)

## 2021-08-01 ENCOUNTER — Other Ambulatory Visit: Payer: Self-pay

## 2021-08-01 ENCOUNTER — Encounter: Payer: Self-pay | Admitting: Cardiology

## 2021-08-01 ENCOUNTER — Ambulatory Visit: Payer: BC Managed Care – PPO | Admitting: Cardiology

## 2021-08-01 VITALS — BP 124/70 | HR 62 | Ht 73.0 in | Wt 221.0 lb

## 2021-08-01 DIAGNOSIS — I2510 Atherosclerotic heart disease of native coronary artery without angina pectoris: Secondary | ICD-10-CM | POA: Diagnosis not present

## 2021-08-01 DIAGNOSIS — E7800 Pure hypercholesterolemia, unspecified: Secondary | ICD-10-CM

## 2021-08-01 NOTE — Progress Notes (Signed)
?Cardiology Office Note:   ? ?Date:  08/01/2021  ? ?ID:  Joshua Lang, DOB [birth year], MRN [id number] ?PCP:  Owens Loffler, MD ?  ?[facility] Providers ?Cardiologist:  None    ? ?Referring MD: Owens Loffler, MD  ? ?Chief Complaint  ?Patient presents with  ? OTher  ?  Follow up post ECHO -- Meds reviewed verbally with patient.   ? ? ?History of Present Illness:   ? ?Joshua Lang is a 66 y.o. male with a hx of CAD (three-vessel calcification on chest CT), hyperlipidemia who presents for follow-up.  Being seen due to CAD.   ? ?Coronary calcification noted on chest CT.  Denies chest pain or shortness of breath.  Started on Lipitor 40 mg daily to control cholesterol.  Echocardiogram obtained to evaluate systolic and diastolic function.  Feels well, has no concerns at this time, presents for echo results. ? ? ?Prior notes ? coronary artery calcifications were noted on noncontrast chest CT dated 04/2021.  He denies chest pain or shortness of breath.  Denies any history of heart disease or family history of MI.   ? ?Past Medical History:  ?Diagnosis Date  ? Allergic rhinitis   ? Colonic polyp   ? Complication of anesthesia   ? WITH 1ST HERNIA SURGERY PT STATES HE WAS CHOKING WITH TUBE IN  ? ED (erectile dysfunction)   ? Enlarged prostate   ? Moderately  ? GERD (gastroesophageal reflux disease)   ? History of kidney stones   ? H/O  ? Hypercholesteremia   ? ? ?Past Surgical History:  ?Procedure Laterality Date  ? COLONOSCOPY  2015  ? HERNIA REPAIR Left 1990's  ? X2  ? INGUINAL HERNIA REPAIR Right 03/24/2016  ? Procedure: HERNIA REPAIR INGUINAL ADULT;  Surgeon: Robert Bellow, MD;  Location: [facility] ORS;  Service: General;  Laterality: Right;  ? JOINT REPLACEMENT Bilateral   ? THR  ? SHOULDER ARTHROSCOPY WITH OPEN ROTATOR CUFF REPAIR Right 03/22/2018  ? Procedure: SHOULDER ARTHROSCOPY WITH OPEN ROTATOR CUFF REPAIR;  Surgeon: Corky Mull, MD;  Location: [facility] OR[facility];  Service: Orthopedics;  Laterality: Right;  ? SHOULDER  OPEN ROTATOR CUFF REPAIR  2014  ? Hooten, partial RTC tear with probably SAD, possible DCE  ? SHOULDER SURGERY  2009  ? open, 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, Clair Gulling Hooten)  ? TOTAL HIP ARTHROPLASTY  09/2009  ? (Hooten)  ? TOTAL HIP ARTHROPLASTY Left 05/20/2015  ? Procedure: TOTAL HIP ARTHROPLASTY;  Surgeon: Dereck Leep, MD;  Location: [facility] OR[facility];  Service: Orthopedics;  Laterality: Left;  ? ? ?Current Medications: ?Current Meds  ?Medication Sig  ? Ascorbic Acid (VITAMIN C WITH ROSE HIPS) 500 MG tablet Take 500 mg by mouth daily.  ? aspirin 81 MG chewable tablet Chew 81 mg by mouth at bedtime.   ? atorvastatin (LIPITOR) 40 MG tablet Take 1 tablet (40 mg total) by mouth daily.  ? B Complex-C (B-COMPLEX WITH VITAMIN C) tablet Take 1 tablet by mouth daily.  ? Cholecalciferol (VITAMIN D3) 50 MCG (2000 UT) TABS Take 1 tablet by mouth daily.  ? Cranberry 500 MG TABS Take 1 tablet by mouth in the morning and at bedtime.  ? fluocinonide cream (LIDEX) 0.05 % Apply bid as directed  ? Multiple Vitamin (MULTIVITAMIN WITH MINERALS) TABS tablet Take 1 tablet by mouth 2 (two) times daily.  ? Omega-3 1000 MG CAPS Take 1 capsule by mouth daily.  ? Probiotic Product (SUPER PROBIOTIC PO) Take 1 tablet by mouth  daily.  ? Saw Palmetto 450 MG CAPS Take 450 mg by mouth 2 (two) times daily.   ? sildenafil (REVATIO) 20 MG tablet Take 2 to 5 tablets by mouth 30 minutes prior to intercourse.  ? tamsulosin (FLOMAX) 0.4 MG CAPS capsule Take 1 capsule (0.4 mg total) by mouth daily.  ?  ? ?Allergies:   Terbinafine and related; Tramadol; Antihistamines, chlorpheniramine-type; and Oxycodone  ? ?Social History  ? ?Socioeconomic History  ? Marital status: Single  ?  Spouse name: Not on file  ? Number of children: Not on file  ? Years of education: Not on file  ? Highest education level: Not on file  ?Occupational History  ? Occupation: [employer], Environmental manager, head  ?  Comment: Doctorate  ?Tobacco Use  ? Smoking status: Never  ? Smokeless tobacco: Never  ?Vaping Use   ? Vaping Use: Never used  ?Substance and Sexual Activity  ? Alcohol use: Yes  ?  Comment: occassional beer  ? Drug use: No  ? Sexual activity: Not on file  ?Other Topics Concern  ? Not on file  ?Social History Narrative  ? ** Merged History Encounter **  ?    ? ?Social Determinants of Health  ? ?Financial Resource Strain: Not on file  ?Food Insecurity: Not on file  ?Transportation Needs: Not on file  ?Physical Activity: Not on file  ?Stress: Not on file  ?Social Connections: Not on file  ?  ? ?Family History: ?The patient's family history includes Migraines in his sister; Prostate cancer in his father. ? ?ROS:   ?Please see the history of present illness.    ? All other systems reviewed and are negative. ? ?EKGs/Labs/Other Studies Reviewed:   ? ?The following studies were reviewed today: ? ? ?EKG:  EKG not ordered today.   ? ?Recent Labs: ?07/30/2021: ALT 29; BUN 13; Creatinine, Ser 0.89; Hemoglobin 14.7; Platelets 176.0; Potassium 4.9; Sodium 139  ?Recent Lipid Panel ?   ?Component Value Date/Time  ? CHOL 143 07/30/2021 1217  ? TRIG 80.0 07/30/2021 1217  ? HDL 56.30 07/30/2021 1217  ? CHOLHDL 3 07/30/2021 1217  ? VLDL 16.0 07/30/2021 1217  ? [place] 71 07/30/2021 1217  ? ? ? ?Risk Assessment/Calculations:   ? ? ?    ? ?Physical Exam:   ? ?VS:  BP 124/70 (BP Location: Left Arm, Patient Position: Sitting, Cuff Size: Normal)   Pulse 62   Ht '6\' 1"'$  (1.854 m)   Wt 221 lb (100.2 kg)   SpO2 98%   BMI 29.16 kg/m?    ? ?Wt Readings from Last 3 Encounters:  ?08/01/21 221 lb (100.2 kg)  ?07/30/21 220 lb (99.8 kg)  ?06/06/21 219 lb (99.3 kg)  ?  ? ?GEN:  Well nourished, well developed in no acute distress ?HEENT: Normal ?NECK: No JVD; No carotid bruits ?LYMPHATICS: No lymphadenopathy ?CARDIAC: RRR, no murmurs, rubs, gallops ?RESPIRATORY:  Clear to auscultation without rales, wheezing or rhonchi  ?ABDOMEN: Soft, non-tender, non-distended ?MUSCULOSKELETAL:  No edema; No deformity  ?SKIN: Warm and dry ?NEUROLOGIC:  Alert and  oriented x 3 ?PSYCHIATRIC:  Normal affect  ? ?ASSESSMENT:   ? ?1. Coronary artery disease involving native coronary artery of native heart without angina pectoris   ?2. Pure hypercholesterolemia   ? ? ?PLAN:   ? ?In order of problems listed above: ? ?Three-vessel coronary artery calcification on noncontrast chest CT. denies chest pain.  Echo shows normal EF 60 to 65%.continue aspirin 81 mg daily, Lipitor 40  mg daily. ?LDL goal less than 70.  Cholesterol controlled.  Continue Lipitor 40 mg daily. ? ?Follow-up in 1 year. ? ?   ? ?Medication Adjustments/Labs and Tests Ordered: ?Current medicines are reviewed at length with the patient today.  Concerns regarding medicines are outlined above.  ?No orders of the defined types were placed in this encounter. ? ?No orders of the defined types were placed in this encounter. ? ? ?Patient Instructions  ?Medication Instructions:  ? ?Your physician recommends that you continue on your current medications as directed. Please refer to the Current Medication list given to you today.  ? ?*If you need a refill on your cardiac medications before your next appointment, please call your pharmacy* ? ? ?Lab Work: ?None ordered ?If you have labs (blood work) drawn today and your tests are completely normal, you will receive your results only by: ?MyChart Message (if you have MyChart) OR ?A paper copy in the mail ?If you have any lab test that is abnormal or we need to change your treatment, we will call you to review the results. ? ? ?Testing/Procedures: ?None ordered ? ? ?Follow-Up: ?At [facility], you and your health needs are our priority.  As part of our continuing mission to provide you with exceptional heart care, we have created designated Provider Care Teams.  These Care Teams include your primary Cardiologist (physician) and Advanced Practice Providers (APPs -  Physician Assistants and Nurse Practitioners) who all work together to provide you with the care you need, when you  need it. ? ?We recommend signing up for the patient portal called "MyChart".  Sign up information is provided on this After Visit Summary.  MyChart is used to connect with patients for Virtual Visits (Teleme

## 2021-08-01 NOTE — Patient Instructions (Addendum)

## 2021-08-13 ENCOUNTER — Other Ambulatory Visit: Payer: Self-pay | Admitting: Family Medicine

## 2021-08-14 ENCOUNTER — Encounter: Payer: Self-pay | Admitting: *Deleted

## 2021-08-20 NOTE — Addendum Note (Signed)
Addended by: Owens Loffler on: 08/20/2021 03:11 PM ? ? Modules accepted: Orders ? ?

## 2021-08-20 NOTE — Telephone Encounter (Signed)
I ordered his follow-up chest CT for nodules.  Non-urgent and he can work around his schedule.  [facility] should contact him. ?

## 2021-08-20 NOTE — Telephone Encounter (Signed)
Left message for Joshua Lang that it is time for his follow up CT.  Dr. Lorelei Pont has placed that order and [facility] will be contacting him to get that scheduled.  I ask that he call me back if he has any questions. ?

## 2021-08-22 NOTE — Telephone Encounter (Signed)
Patient called back re: message left, ?Relayed message below as written ? ?Patient understood and will call [facility] ?

## 2021-08-28 ENCOUNTER — Ambulatory Visit
Admission: RE | Admit: 2021-08-28 | Discharge: 2021-08-28 | Disposition: A | Discharge status: Home / Self Care | Payer: BC Managed Care – PPO | Source: Ambulatory Visit | Attending: Family Medicine | Admitting: Family Medicine

## 2021-08-28 DIAGNOSIS — R918 Other nonspecific abnormal finding of lung field: Secondary | ICD-10-CM

## 2021-08-28 DIAGNOSIS — R9389 Abnormal findings on diagnostic imaging of other specified body structures: Secondary | ICD-10-CM

## 2021-08-28 MED ORDER — IOPAMIDOL (ISOVUE-300) INJECTION 61%
75.0000 mL | Freq: Once | INTRAVENOUS | Status: AC | PRN
  Administered 2021-08-28: 75 mL via INTRAVENOUS

## 2021-09-01 ENCOUNTER — Telehealth: Payer: Self-pay | Admitting: Family Medicine

## 2021-09-01 NOTE — Telephone Encounter (Signed)
Pt would like to discuss CT Scan, says it's ok to leave him a detailed voicemail.  ?CB: [phone] ?

## 2021-09-02 NOTE — Telephone Encounter (Signed)
Patient would like CT results once they have been reviewed. ?

## 2021-09-03 NOTE — Telephone Encounter (Signed)
See result note.  

## 2021-09-03 NOTE — Telephone Encounter (Signed)
Can you call [name]: ?  ?Radiology recommends a follow-up in 3-6 months CT, since the nodule has grown from 7 mm to 9 mm.  Honestly, I think that seeing Pulmonology is a better idea.  They will still follow it, but I do not like that it has grown. ?  ?"Ground-glass nodular density peripherally in the left upper lobe ?measures 9 mm compared to 7 mm previously. Given the slight ?enlargement, recommend continued close follow-up with repeat CT in ?3-6 months." ? ?See phone note ?

## 2021-09-04 ENCOUNTER — Telehealth: Payer: Self-pay | Admitting: Family Medicine

## 2021-09-04 DIAGNOSIS — R911 Solitary pulmonary nodule: Secondary | ICD-10-CM

## 2021-09-04 DIAGNOSIS — R918 Other nonspecific abnormal finding of lung field: Secondary | ICD-10-CM

## 2021-09-04 NOTE — Telephone Encounter (Signed)
-----   Message from Carter Kitten, [place] sent at 09/03/2021 12:26 PM EDT ----- ?Mr. Joshua Lang notified as instructed by telephone.  Patient is agreeable with pulmonology referral. [place] location. ?

## 2021-09-04 NOTE — Telephone Encounter (Signed)
I spoke with Joshua Lang.  Since this has increased in a relatively short amount of time, I think that getting pulmonology to weigh-in would be helpful. ?

## 2021-09-16 ENCOUNTER — Encounter: Payer: Self-pay | Admitting: Pulmonary Disease

## 2021-09-16 ENCOUNTER — Ambulatory Visit: Payer: Medicare PPO | Admitting: Pulmonary Disease

## 2021-09-16 VITALS — BP 122/72 | HR 71 | Temp 98.0°F | Ht 73.0 in | Wt 218.2 lb

## 2021-09-16 DIAGNOSIS — R911 Solitary pulmonary nodule: Secondary | ICD-10-CM

## 2021-09-16 DIAGNOSIS — R1319 Other dysphagia: Secondary | ICD-10-CM

## 2021-09-16 DIAGNOSIS — K219 Gastro-esophageal reflux disease without esophagitis: Secondary | ICD-10-CM

## 2021-09-16 DIAGNOSIS — K449 Diaphragmatic hernia without obstruction or gangrene: Secondary | ICD-10-CM | POA: Diagnosis not present

## 2021-09-16 MED ORDER — ESOMEPRAZOLE MAGNESIUM 40 MG PO CPDR: 40.0000 mg | DELAYED_RELEASE_CAPSULE | Freq: Every day | ORAL | 2 refills | Status: DC

## 2021-09-16 NOTE — Progress Notes (Signed)
Subjective:    Patient ID: Joshua Lang, male    DOB: [birth year], 66 y.o.   MRN: [id number] Patient Care Team: Hannah Beat, MD as PCP - General Hannah Beat, MD as Consulting Physician (Family Medicine) Lemar Livings, Merrily Pew, MD (General Surgery) Debbe Odea, MD as Consulting Physician (Cardiology)  Chief Complaint  Patient presents with   pulmonary consult    Recent CT--no current sx.     HPI Patient is a 66 year old lifelong never smoker who presents for evaluation of a lung nodule.  He is kindly referred by Dr. Karleen Hampshire Copland.  The patient has been followed for lung nodules with CT chest since around 2022.  At that time a chest x-ray performed in November 2022 showed 2 small lung nodules and a chest CT was recommended.  ED was performed 07 May 2021 that showed a 7 mm groundglass nodule peripherally in the left upper lobe follow-up CT recommended in 3 to 6 months.  The remainder of the nodules were noted to be granulomata or not seen by CT.  The patient was also noted to have a moderate size hiatal hernia.  A follow-up CT chest was then performed on 28 August 2021 that showed that this groundglass nodule had enlarged to 9 mm still with groundglass appearance.  It was recommended that continued follow-up be done in 3 to 6 months.  The patient is now being referred here for further follow-up.  The patient has been asymptomatic with regards to the lung nodule however he has been having significant issues with gastroesophageal reflux, given the size of his hiatal hernia on CT this is likely the culprit.  He has also been having some dysphagia associated with these issues. He has not had any weight loss or anorexia, no cough or sputum production, no hemoptysis.  No shortness of breath, wheezing, no chest pain.  Orthopnea, paroxysmal nocturnal dyspnea, lower extremity edema or calf tenderness.  No other concern voiced.   The patient had COVID-19 last July, he noted that he had  "rough time" particularly with his breathing.  He eventually recovered.  He did not require hospitalization.  He did notice that he had significant chest congestion during that time.    He does not have any military history no significant occupational exposures.  He is retired.    Review of Systems A 10 point review of systems was performed and it is as noted above otherwise negative.  Past Medical History:  Diagnosis Date   Allergic rhinitis    Colonic polyp    Complication of anesthesia    WITH 1ST HERNIA SURGERY PT STATES HE WAS CHOKING WITH TUBE IN   ED (erectile dysfunction)    Enlarged prostate    Moderately   GERD (gastroesophageal reflux disease)    History of kidney stones    H/O   Hypercholesteremia    Past Surgical History:  Procedure Laterality Date   COLONOSCOPY  2015   HERNIA REPAIR Left 1990's   X2   INGUINAL HERNIA REPAIR Right 03/24/2016   Procedure: HERNIA REPAIR INGUINAL ADULT;  Surgeon: Earline Mayotte, MD;  Location: [facility] ORS;  Service: General;  Laterality: Right;   JOINT REPLACEMENT Bilateral    THR   SHOULDER ARTHROSCOPY WITH OPEN ROTATOR CUFF REPAIR Right 03/22/2018   Procedure: SHOULDER ARTHROSCOPY WITH OPEN ROTATOR CUFF REPAIR;  Surgeon: Christena Flake, MD;  Location: [facility] ORS;  Service: Orthopedics;  Laterality: Right;   SHOULDER OPEN ROTATOR CUFF REPAIR  2014  [place], partial RTC tear with probably SAD, possible DCE   SHOULDER SURGERY  2009   open, 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, (Jim Hooten)   TOTAL HIP ARTHROPLASTY  09/2009   ([place])   TOTAL HIP ARTHROPLASTY Left 05/20/2015   Procedure: TOTAL HIP ARTHROPLASTY;  Surgeon: Donato Heinz, MD;  Location: [facility] ORS;  Service: Orthopedics;  Laterality: Left;   Patient Active Problem List   Diagnosis Date Noted   Coronary artery disease involving native coronary artery of native heart without angina pectoris 05/18/2021   Benign prostatic hyperplasia without lower urinary tract symptoms 03/10/2017   Hernia of  abdominal cavity 01/23/2016   S/P total hip arthroplasty 05/20/2015   ALLERGIC RHINITIS 02/17/2009   ERECTILE DYSFUNCTION, 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   UNEQUAL LEG LENGTH 02/13/2009   Sleep apnea 02/13/2009   COLONIC POLYPS 10/30/2008   HYPERCHOLESTEROLEMIA 10/30/2008   NEPHROLITHIASIS 10/30/2008   DEGENERATIVE JOINT DISEASE, HIPS 10/30/2008   Family History  Problem Relation Age of Onset   Prostate cancer Father    Migraines Sister    Social History   Tobacco Use   Smoking status: Never   Smokeless tobacco: Never  Substance Use Topics   Alcohol use: Yes    Comment: occassional beer   Allergies  Allergen Reactions   Terbinafine And Related     Trouble sleeping, depression & fatigue   Tramadol Itching   Antihistamines, Chlorpheniramine-Type Other (See Comments)    Urinary frequency/urgency. Other reaction(s): Other, Unknown Other reaction(s): Other (See Comments) Urinary frequency/urgency. Urinary frequency/urgency. Urinary frequency/urgency.   Oxycodone Itching    And hyper feeling/mind races And hyper feeling. "Hyper feeling" And hyper feeling/mind races   Current Meds  Medication Sig   Ascorbic Acid (VITAMIN C WITH ROSE HIPS) 500 MG tablet Take 500 mg by mouth daily.   aspirin 81 MG chewable tablet Chew 81 mg by mouth at bedtime.    atorvastatin (LIPITOR) 40 MG tablet Take 1 tablet (40 mg total) by mouth daily.   B Complex-C (B-COMPLEX WITH VITAMIN C) tablet Take 1 tablet by mouth daily.   Cholecalciferol (VITAMIN D3) 50 MCG (2000 UT) TABS Take 1 tablet by mouth daily.   Cranberry 500 MG TABS Take 1 tablet by mouth in the morning and at bedtime.   fluocinonide cream (LIDEX) 0.05 % Apply bid as directed   Multiple Vitamin (MULTIVITAMIN WITH MINERALS) TABS tablet Take 1 tablet by mouth 2 (two) times daily.   Omega-3 1000 MG CAPS Take 1 capsule by mouth daily.   Probiotic Product (SUPER PROBIOTIC PO) Take 1 tablet by mouth daily.   Saw Palmetto 450 MG CAPS Take 450 mg by  mouth 2 (two) times daily.    sildenafil (REVATIO) 20 MG tablet Take 2 to 5 tablets by mouth 30 minutes prior to intercourse.   tamsulosin (FLOMAX) 0.4 MG CAPS capsule Take 1 capsule (0.4 mg total) by mouth daily.   Immunization History  Administered Date(s) Administered   Influenza Whole 02/08/2009, 02/07/2012   Influenza, Seasonal, Injecte, Preservative Fre 01/08/2014   Influenza,inj,Quad PF,6+ Mos 01/09/2015, 01/01/2016, 01/14/2017, 02/03/2018, 01/10/2019, 04/23/2020, 01/21/2021   PFIZER(Purple Top)SARS-COV-2 Vaccination 07/27/2019, 08/17/2019, 04/26/2020   Pneumococcal Conjugate-13 04/23/2015   Pneumococcal Polysaccharide-23 03/28/2016   Td 02/13/2009   Tdap 01/01/2016   Zoster Recombinat (Shingrix) 01/14/2017, 03/23/2017   Zoster, Live 06/22/2013      Objective:   Physical Exam BP 122/72 (BP Location: Left Arm, Cuff Size: Normal)   Pulse 71   Temp 98 F (36.7 C) (Temporal)   Ht  6\' 1"  (1.854 m)   Wt 218 lb 3.2 oz (99 kg)   SpO2 97%   BMI 28.79 kg/m   GENERAL: Well-developed, well-nourished gentleman, no acute distress.  Fully ambulatory, no conversational dyspnea. HEAD: Normocephalic, atraumatic.  EYES: Pupils equal, round, reactive to light.  No scleral icterus.  MOUTH: Nose/mouth/throat not examined due to institutional masking requirements. NECK: Supple. No thyromegaly. Trachea midline. No JVD.  No adenopathy. PULMONARY: Good air entry bilaterally.  No adventitious sounds. CARDIOVASCULAR: S1 and S2. Regular rate and rhythm.  No rubs, murmurs or gallops heard. ABDOMEN: Benign. MUSCULOSKELETAL: No joint deformity, no clubbing, no edema.  NEUROLOGIC: No overt focal deficit, no gait disturbance, speech is fluent. SKIN: Intact,warm,dry. PSYCH: Mood and behavior normal.  Representative image from CT with contrast performed 28 August 2021 showing the groundglass nodule in question (arrow):      Assessment & Plan:     ICD-10-CM   1. Lung nodule seen on imaging study   R91.1 CT CHEST WO CONTRAST   Follow-up with CT chest 35-months Not  amenable for biopsy at present    2. Hiatal hernia with GERD  K44.9 Ambulatory referral to Gastroenterology   K21.9    Exceedingly symptomatic Trial of Nexium Referral to GI    3. Esophageal dysphagia  R13.19    Hopefully will respond to Nexium May be due to esophagitis Referral to GI     Orders Placed This Encounter  Procedures   CT CHEST WO CONTRAST    [employer] epic WT 218 No Special needs/No spinal cord stimulator/body injector/glucose monitor/port attached)    Standing Status:   Future    Number of Occurrences:   1    Standing Expiration Date:   09/17/2022    Scheduling Instructions:     47mo    Order Specific Question:   Preferred imaging location?    Answer:   [facility]   Ambulatory referral to Gastroenterology    Referral Priority:   Routine    Referral Type:   Consultation    Referral Reason:   Specialty Services Required    Number of Visits Requested:   1   Meds ordered this encounter  Medications   esomeprazole (NEXIUM) 40 MG capsule    Sig: Take 1 capsule (40 mg total) by mouth daily.    Dispense:  30 capsule    Refill:  2   Will see the patient in follow-up in months time after CT chest.  He is to contact us prior to that time should any new difficulties arise.  The appropriate referrals were made for the patient.  Gailen Shelter, MD Advanced Bronchoscopy PCCM [facility]    *This note was dictated using voice recognition software/Dragon.  Despite best efforts to proofread, errors can occur which can change the meaning. Any transcriptional errors that result from this process are unintentional and may not be fully corrected at the time of dictation.

## 2021-09-16 NOTE — Patient Instructions (Signed)
We are repeating a CT scan of the chest in 6 months time. ? ?Referring you to gastroenterology for evaluation of your difficulty swallowing and hiatal hernia. ? ?I have sent in a prescription for Nexium (esomeprazole) which is an antiacid medication to help with your reflux (hiatal hernia) symptoms. ? ?We will see you in follow-up in 6 months time after you CT chest.  Please call sooner should you have any new respiratory issues. ?

## 2021-10-19 NOTE — Progress Notes (Unsigned)
    Atilano Covelli T. Letonya Mangels, MD, [place] [facility] [place], [address]  Phone: [phone]  FAX: [phone]  Joshua Lang - 66 y.o. male  MRN [id number]  Date of Birth: Dec 19, 1955  Date: 10/20/2021  PCP: Owens Loffler, MD  Referral: Owens Loffler, MD  No chief complaint on file.  Subjective:   Joshua Lang is a 66 y.o. very pleasant male patient with There is no height or weight on file to calculate BMI. who presents with the following:  Joshua Lang presents with some ongoing L heel pain:    Review of Systems is noted in the HPI, as appropriate  Objective:   There were no vitals taken for this visit.  GEN: No acute distress; alert,appropriate. PULM: Breathing comfortably in no respiratory distress PSYCH: Normally interactive.   Laboratory and Imaging Data:  Assessment and Plan:   ***

## 2021-10-20 ENCOUNTER — Encounter: Payer: Self-pay | Admitting: Family Medicine

## 2021-10-20 ENCOUNTER — Ambulatory Visit: Payer: Medicare PPO | Admitting: Family Medicine

## 2021-10-20 VITALS — BP 100/70 | HR 76 | Temp 98.3°F | Ht 73.0 in | Wt 221.1 lb

## 2021-10-20 DIAGNOSIS — M722 Plantar fascial fibromatosis: Secondary | ICD-10-CM

## 2021-10-20 NOTE — Patient Instructions (Signed)

## 2021-11-14 ENCOUNTER — Other Ambulatory Visit: Payer: Self-pay | Admitting: Pulmonary Disease

## 2022-01-22 ENCOUNTER — Ambulatory Visit: Payer: Medicare PPO | Admitting: Gastroenterology

## 2022-01-29 ENCOUNTER — Other Ambulatory Visit: Payer: Medicare PPO

## 2022-01-29 DIAGNOSIS — N400 Enlarged prostate without lower urinary tract symptoms: Secondary | ICD-10-CM

## 2022-01-30 LAB — PSA: Prostate Specific Ag, Serum: 0.6 ng/mL (ref 0.0–4.0)

## 2022-02-04 ENCOUNTER — Ambulatory Visit: Payer: BC Managed Care – PPO | Admitting: Urology

## 2022-02-12 ENCOUNTER — Other Ambulatory Visit: Payer: Self-pay | Admitting: Pulmonary Disease

## 2022-02-12 ENCOUNTER — Ambulatory Visit: Payer: Medicare PPO | Admitting: Urology

## 2022-02-12 ENCOUNTER — Encounter: Payer: Self-pay | Admitting: Urology

## 2022-02-12 VITALS — BP 125/82 | HR 65 | Ht 73.0 in

## 2022-02-12 DIAGNOSIS — N400 Enlarged prostate without lower urinary tract symptoms: Secondary | ICD-10-CM

## 2022-02-12 DIAGNOSIS — N390 Urinary tract infection, site not specified: Secondary | ICD-10-CM | POA: Diagnosis not present

## 2022-02-12 DIAGNOSIS — N529 Male erectile dysfunction, unspecified: Secondary | ICD-10-CM | POA: Diagnosis not present

## 2022-02-12 DIAGNOSIS — Z87440 Personal history of urinary (tract) infections: Secondary | ICD-10-CM | POA: Diagnosis not present

## 2022-02-12 DIAGNOSIS — Z125 Encounter for screening for malignant neoplasm of prostate: Secondary | ICD-10-CM

## 2022-02-12 LAB — URINALYSIS, COMPLETE
Bilirubin, UA: NEGATIVE
Glucose, UA: NEGATIVE
Ketones, UA: NEGATIVE
Leukocytes,UA: NEGATIVE
Nitrite, UA: NEGATIVE
Protein,UA: NEGATIVE
RBC, UA: NEGATIVE
Specific Gravity, UA: <1.005 — ABNORMAL LOW (ref 1.005–1.030)
Urobilinogen, Ur: 0.2 mg/dL (ref 0.2–1.0)
pH, UA: 6.5 (ref 5.0–7.5)

## 2022-02-12 LAB — MICROSCOPIC EXAMINATION: Bacteria, UA: NONE SEEN

## 2022-02-12 LAB — BLADDER SCAN AMB NON-IMAGING

## 2022-02-12 MED ORDER — SILDENAFIL CITRATE 20 MG PO TABS: ORAL_TABLET | ORAL | 6 refills | Status: DC

## 2022-02-12 MED ORDER — SILDENAFIL CITRATE 20 MG PO TABS: ORAL_TABLET | ORAL | 3 refills | Status: DC

## 2022-02-12 NOTE — Progress Notes (Signed)
   02/12/2022 2:06 PM   Joshua Lang 1956-03-22 [id number]  Reason for visit: Follow up PSA screening, recurrent prostatitis, BPH, ED  HPI: Healthy 66 year old male with a family history of prostate cancer in his father who has had 7-8 episodes of culture documented prostatitis over the last 10 to 15 years.  Many of these were associated with stress.  We previously offered further evaluation with cystoscopy and CT urogram, but he deferred.  He was amenable to starting cranberry tablet prophylaxis, and has not had any infections since that time and is very pleased with his urinary symptoms.  He wanted to give a urinalysis today just to be sure, and this was completely normal.  PVR normal today at 15m.  We stopped Flomax at our last visit as he was having minimal urinary symptoms, but this was resumed by PCP for unclear indications.  He would like to continue on the Flomax, but we discussed he could trial off this in the future again to see if his urinary symptoms change.  He is unsure why it was restarted, but would like to continue on that medication.  PSA has been stable, and remains low at 0.6 from 0.9 last year, 0.7 in March 2022, and 0.6 in March 2020.  I reviewed numerous prior PSA values, and PSA really has been stable around 0.7-0.9 over the last 10+ years.  We reviewed the AUA guidelines that recommend screening up to age 65  He has ED that is responsive to sildenafil 40 to 100 mg on demand.  I refilled this medication.  [place]  I also discussed less expensive online pharmacy like [email or web address], but he would like to continue with his local pharmacy.  -Recommended continuing cranberry tablet for prostatitis/UTI prevention -Sildenafil refilled -RTC 1 year with PSA prior, PVR  BBilley Co MD  [id number] [place] [phone]

## 2022-03-10 ENCOUNTER — Other Ambulatory Visit: Payer: Medicare PPO

## 2022-03-18 ENCOUNTER — Ambulatory Visit: Payer: BC Managed Care – PPO | Admitting: Urology

## 2022-03-20 ENCOUNTER — Ambulatory Visit
Admission: RE | Admit: 2022-03-20 | Discharge: 2022-03-20 | Disposition: A | Discharge status: Home / Self Care | Payer: Medicare PPO | Source: Ambulatory Visit | Attending: Pulmonary Disease | Admitting: Pulmonary Disease

## 2022-03-20 DIAGNOSIS — I700 Atherosclerosis of aorta: Secondary | ICD-10-CM | POA: Diagnosis not present

## 2022-03-20 DIAGNOSIS — R911 Solitary pulmonary nodule: Secondary | ICD-10-CM

## 2022-03-24 ENCOUNTER — Other Ambulatory Visit: Payer: Self-pay

## 2022-03-24 DIAGNOSIS — R911 Solitary pulmonary nodule: Secondary | ICD-10-CM

## 2022-05-03 ENCOUNTER — Other Ambulatory Visit: Payer: Self-pay | Admitting: Family Medicine

## 2022-05-07 ENCOUNTER — Telehealth: Payer: Self-pay | Admitting: Family Medicine

## 2022-05-07 MED ORDER — ESOMEPRAZOLE MAGNESIUM 40 MG PO CPDR: DELAYED_RELEASE_CAPSULE | ORAL | 0 refills | Status: DC

## 2022-05-07 MED ORDER — TAMSULOSIN HCL 0.4 MG PO CAPS: 0.4000 mg | ORAL_CAPSULE | Freq: Every day | ORAL | 0 refills | Status: DC

## 2022-05-07 NOTE — Telephone Encounter (Signed)
Refills sent to [facility] as requested.

## 2022-05-07 NOTE — Telephone Encounter (Signed)
  Encourage patient to contact the pharmacy for refills or they can request refills through [facility]:  Please schedule appointment if longer than 1 year  NEXT APPOINTMENT DATE:  MEDICATION:tamsulosin (FLOMAX) 0.4 MG CAPS capsule  esomeprazole (NEXIUM) 40 MG capsule   Is the patient out of medication?   PHARMACY: [id number] [address], [place]     Let patient know to contact pharmacy at the end of the day to make sure medication is ready.  Please notify patient to allow 48-72 hours to process  CLINICAL FILLS OUT ALL BELOW:   LAST REFILL:  QTY:  REFILL DATE:    OTHER COMMENTS:    Okay for refill?  Please advise

## 2022-05-22 ENCOUNTER — Telehealth: Payer: Medicare PPO | Admitting: Nurse Practitioner

## 2022-05-22 DIAGNOSIS — J40 Bronchitis, not specified as acute or chronic: Secondary | ICD-10-CM | POA: Diagnosis not present

## 2022-05-22 MED ORDER — PREDNISONE 20 MG PO TABS: 20.0000 mg | ORAL_TABLET | Freq: Two times a day (BID) | ORAL | 0 refills | Status: AC

## 2022-05-22 MED ORDER — AZITHROMYCIN 250 MG PO TABS: ORAL_TABLET | ORAL | 0 refills | Status: AC

## 2022-05-22 MED ORDER — BENZONATATE 100 MG PO CAPS: 100.0000 mg | ORAL_CAPSULE | Freq: Three times a day (TID) | ORAL | 0 refills | Status: DC | PRN

## 2022-05-22 NOTE — Progress Notes (Signed)
Virtual Visit Consent   Joshua Lang, you are scheduled for a virtual visit with a [facility] provider today. Just as with appointments in the office, your consent must be obtained to participate. Your consent will be active for this visit and any virtual visit you may have with one of our providers in the next 365 days. If you have a MyChart account, a copy of this consent can be sent to you electronically.  As this is a virtual visit, video technology does not allow for your provider to perform a traditional examination. This may limit your provider's ability to fully assess your condition. If your provider identifies any concerns that need to be evaluated in person or the need to arrange testing (such as labs, EKG, etc.), we will make arrangements to do so. Although advances in technology are sophisticated, we cannot ensure that it will always work on either your end or our end. If the connection with a video visit is poor, the visit may have to be switched to a telephone visit. With either a video or telephone visit, we are not always able to ensure that we have a secure connection.  By engaging in this virtual visit, you consent to the provision of healthcare and authorize for your insurance to be billed (if applicable) for the services provided during this visit. Depending on your insurance coverage, you may receive a charge related to this service.  I need to obtain your verbal consent now. Are you willing to proceed with your visit today? Joshua Lang has provided verbal consent on 05/22/2022 for a virtual visit (video or telephone). Joshua Schneiders, FNP  Date: 05/22/2022 1:15 PM  Virtual Visit via Video Note   I, Joshua Lang, connected with  Joshua Lang  ([id number], 08-21-1955) on 05/22/22 at  1:15 PM EST by a video-enabled telemedicine application and verified that I am speaking with the correct person using two identifiers.  Location: Patient: Virtual Visit Location Patient:  Home Provider: Virtual Visit Location Provider: Home Office   I discussed the limitations of evaluation and management by telemedicine and the availability of in person appointments. The patient expressed understanding and agreed to proceed.    History of Present Illness: Joshua Lang is a 67 y.o. who identifies as a male who was assigned male at birth, and is being seen today for congestion and cough.  Over Christmas he was ill with stomach virus with persistent diarrhea Over the past 5 days he has had a head cold and nasal congestion.  Runny nose  Now has a cough that kept him up all night   This has started to irritate his stomach that has not completely returned to normal since his stomach virus.   He was taking imodium for the diarrhea   He has been been eating normal   Denies a history of asthma or COPD   Problems:  Patient Active Problem List   Diagnosis Date Noted   Coronary artery disease involving native coronary artery of native heart without angina pectoris 05/18/2021   Benign prostatic hyperplasia without lower urinary tract symptoms 03/10/2017   Hernia of abdominal cavity 01/23/2016   S/P total hip arthroplasty 05/20/2015   ALLERGIC RHINITIS 02/17/2009   ERECTILE DYSFUNCTION, 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   UNEQUAL LEG LENGTH 02/13/2009   Sleep apnea 02/13/2009   COLONIC POLYPS 10/30/2008   HYPERCHOLESTEROLEMIA 10/30/2008   NEPHROLITHIASIS 10/30/2008    Allergies:  Allergies  Allergen Reactions   Terbinafine And Related  Trouble sleeping, depression & fatigue   Tramadol Itching   Antihistamines, Chlorpheniramine-Type Other (See Comments)    Urinary frequency/urgency. Other reaction(s): Other, Unknown Other reaction(s): Other (See Comments) Urinary frequency/urgency. Urinary frequency/urgency. Urinary frequency/urgency.   Oxycodone Itching    And hyper feeling/mind races And hyper feeling. "Hyper feeling" And hyper feeling/mind races   Medications:   Current Outpatient Medications:    Ascorbic Acid (VITAMIN C WITH ROSE HIPS) 500 MG tablet, Take 500 mg by mouth daily., Disp: , Rfl:    aspirin 81 MG chewable tablet, Chew 81 mg by mouth at bedtime. , Disp: , Rfl:    atorvastatin (LIPITOR) 40 MG tablet, Take 1 tablet (40 mg total) by mouth daily., Disp: 90 tablet, Rfl: 3   B Complex-C (B-COMPLEX WITH VITAMIN C) tablet, Take 1 tablet by mouth daily., Disp: , Rfl:    Cholecalciferol (VITAMIN D3) 50 MCG (2000 UT) TABS, Take 1 tablet by mouth daily., Disp: , Rfl:    Cranberry 500 MG TABS, Take 1 tablet by mouth in the morning and at bedtime., Disp: , Rfl:    esomeprazole (NEXIUM) 40 MG capsule, TAKE 1 CAPSULE(40 MG) BY MOUTH DAILY, Disp: 90 capsule, Rfl: 0   fluocinonide cream (LIDEX) 0.05 %, Apply bid as directed, Disp: 60 g, Rfl: 3   Magnesium Gluconate (MAG-G PO), Take by mouth., Disp: , Rfl:    Multiple Vitamin (MULTIVITAMIN WITH MINERALS) TABS tablet, Take 1 tablet by mouth 2 (two) times daily., Disp: , Rfl:    Omega-3 1000 MG CAPS, Take 1 capsule by mouth daily., Disp: , Rfl:    Probiotic Product (SUPER PROBIOTIC PO), Take 1 tablet by mouth daily., Disp: , Rfl:    Saw Palmetto 450 MG CAPS, Take 450 mg by mouth 2 (two) times daily. , Disp: , Rfl:    sildenafil (REVATIO) 20 MG tablet, Take 2 to 5 tablets by mouth 30 minutes prior to intercourse., Disp: 90 tablet, Rfl: 3   tamsulosin (FLOMAX) 0.4 MG CAPS capsule, Take 1 capsule (0.4 mg total) by mouth daily., Disp: 90 capsule, Rfl: 0  Observations/Objective: Patient is well-developed, well-nourished in no acute distress.  Resting comfortably  at home.  Head is normocephalic, atraumatic.  No labored breathing.  Speech is clear and coherent with logical content.  Patient is alert and oriented at baseline.    Assessment and Plan:  1. Bronchitis  - azithromycin (ZITHROMAX) 250 MG tablet; Take 2 tablets on day 1, then 1 tablet daily on days 2 through 5  Dispense: 6 tablet; Refill: 0 -  predniSONE (DELTASONE) 20 MG tablet; Take 1 tablet (20 mg total) by mouth 2 (two) times daily with a meal for 5 days.  Dispense: 10 tablet; Refill: 0 - benzonatate (TESSALON) 100 MG capsule; Take 1 capsule (100 mg total) by mouth 3 (three) times daily as needed.  Dispense: 30 capsule; Refill: 0      Follow Up Instructions: I discussed the assessment and treatment plan with the patient. The patient was provided an opportunity to ask questions and all were answered. The patient agreed with the plan and demonstrated an understanding of the instructions.  A copy of instructions were sent to the patient via MyChart unless otherwise noted below.    The patient was advised to call back or seek an in-person evaluation if the symptoms worsen or if the condition fails to improve as anticipated.  Time:  I spent 10 minutes with the patient via telehealth technology discussing the above problems/concerns.  Joshua Schneiders, FNP

## 2022-05-29 ENCOUNTER — Encounter: Payer: Self-pay | Admitting: Family Medicine

## 2022-05-29 ENCOUNTER — Ambulatory Visit: Payer: Medicare PPO | Admitting: Family Medicine

## 2022-05-29 VITALS — BP 118/76 | HR 75 | Temp 97.8°F | Ht 73.0 in | Wt 224.1 lb

## 2022-05-29 DIAGNOSIS — J209 Acute bronchitis, unspecified: Secondary | ICD-10-CM | POA: Diagnosis not present

## 2022-05-29 MED ORDER — BENZONATATE 200 MG PO CAPS: 200.0000 mg | ORAL_CAPSULE | Freq: Two times a day (BID) | ORAL | 0 refills | Status: DC | PRN

## 2022-05-29 MED ORDER — AZITHROMYCIN 250 MG PO TABS: ORAL_TABLET | ORAL | 0 refills | Status: DC

## 2022-05-29 NOTE — Assessment & Plan Note (Signed)
Acute, some improvement with steroids and antibiotics but not fully resolved.  Will repeat course of antibiotics: Z-Pak x 5 days. Benzonatate helped significantly with cough so I will refill this.  Return and ER precautions provided for patient.

## 2022-05-29 NOTE — Progress Notes (Signed)
Patient ID: Joshua Lang, male    DOB: [birth year], 67 y.o.   MRN: [id number]  This visit was conducted in person.  BP 118/76   Pulse 75   Temp 97.8 F (36.6 C) (Oral)   Ht '6\' 1"'$  (1.854 m)   Wt 224 lb 2 oz (101.7 kg)   SpO2 96%   BMI 29.57 kg/m    CC:  Chief Complaint  Patient presents with   Cough   Nasal Congestion    Video Visit 05/22/22-Dx Bronchitis and given Z-Pak, Prednisone and Tessalon Perles Still Symptomatic    Subjective:   HPI: ABDULAZIZ Lang is a 67 y.o. male patient of Dr. Lillie Fragmin presenting on 05/29/2022 for Cough and Nasal Congestion (Video Visit 05/22/22-Dx Bronchitis and given Z-Pak, Prednisone and Tessalon Perles/Still Symptomatic)   Reviewed office visit via video from May 22, 2022.  Diagnosed with bronchitis and given a Z-Pak, prednisone and Tessalon Perles.  Symptoms initially began January 7.  Today he is on day 12 of illness.  His symptoms initially started over Christmas with a stomach virus and persistent diarrhea.  Starting January 7 he started with head cold with nasal congestion.  Symptoms then progressed to cough that kept him up at night.  Today he reports symptoms improved significantly.. decreased cough, congestion.  IN last week 3 days ago meds completed... still yellow nasal discharge. Still continue cough.  No ST, no ear pain, no  ear pain. No fever.  No further  diarrhea  He has no history of asthma or COPD and is a non-smoker     Relevant past medical, surgical, family and social history reviewed and updated as indicated. Interim medical history since our last visit reviewed. Allergies and medications reviewed and updated. Outpatient Medications Prior to Visit  Medication Sig Dispense Refill   Ascorbic Acid (VITAMIN C WITH ROSE HIPS) 500 MG tablet Take 500 mg by mouth daily.     aspirin 81 MG chewable tablet Chew 81 mg by mouth at bedtime.      atorvastatin (LIPITOR) 40 MG tablet Take 1 tablet (40 mg total) by mouth daily. 90  tablet 3   B Complex-C (B-COMPLEX WITH VITAMIN C) tablet Take 1 tablet by mouth daily.     benzonatate (TESSALON) 100 MG capsule Take 1 capsule (100 mg total) by mouth 3 (three) times daily as needed. 30 capsule 0   Cholecalciferol (VITAMIN D3) 50 MCG (2000 UT) TABS Take 1 tablet by mouth daily.     Cranberry 500 MG TABS Take 1 tablet by mouth in the morning and at bedtime.     esomeprazole (NEXIUM) 40 MG capsule TAKE 1 CAPSULE(40 MG) BY MOUTH DAILY 90 capsule 0   fluocinonide cream (LIDEX) 0.05 % Apply bid as directed 60 g 3   Magnesium Gluconate (MAG-G PO) Take by mouth.     Multiple Vitamin (MULTIVITAMIN WITH MINERALS) TABS tablet Take 1 tablet by mouth 2 (two) times daily.     Omega-3 1000 MG CAPS Take 1 capsule by mouth daily.     Probiotic Product (SUPER PROBIOTIC PO) Take 1 tablet by mouth daily.     Saw Palmetto 450 MG CAPS Take 450 mg by mouth 2 (two) times daily.      sildenafil (REVATIO) 20 MG tablet Take 2 to 5 tablets by mouth 30 minutes prior to intercourse. 90 tablet 3   tamsulosin (FLOMAX) 0.4 MG CAPS capsule Take 1 capsule (0.4 mg total) by mouth daily. 90 capsule 0  No facility-administered medications prior to visit.     Per HPI unless specifically indicated in ROS section below Review of Systems  Constitutional:  Negative for fatigue and fever.  HENT:  Positive for congestion and sinus pressure. Negative for ear pain.   Eyes:  Negative for pain.  Respiratory:  Positive for cough. Negative for shortness of breath.   Cardiovascular:  Negative for chest pain, palpitations and leg swelling.  Gastrointestinal:  Negative for abdominal pain.  Genitourinary:  Negative for dysuria.  Musculoskeletal:  Negative for arthralgias.  Neurological:  Negative for syncope, light-headedness and headaches.  Psychiatric/Behavioral:  Negative for dysphoric mood.    Objective:  BP 118/76   Pulse 75   Temp 97.8 F (36.6 C) (Oral)   Ht '6\' 1"'$  (1.854 m)   Wt 224 lb 2 oz (101.7 kg)    SpO2 96%   BMI 29.57 kg/m   Wt Readings from Last 3 Encounters:  05/29/22 224 lb 2 oz (101.7 kg)  10/20/21 221 lb 1 oz (100.3 kg)  09/16/21 218 lb 3.2 oz (99 kg)      Physical Exam Constitutional:      Appearance: He is well-developed.  HENT:     Head: Normocephalic.     Right Ear: Hearing normal.     Left Ear: Hearing normal.     Nose: Nose normal.     Right Turbinates: Swollen.     Left Turbinates: Swollen.     Right Sinus: No maxillary sinus tenderness or frontal sinus tenderness.     Left Sinus: No maxillary sinus tenderness or frontal sinus tenderness.  Neck:     Thyroid: No thyroid mass or thyromegaly.     Vascular: No carotid bruit.     Trachea: Trachea normal.  Cardiovascular:     Rate and Rhythm: Normal rate and regular rhythm.     Pulses: Normal pulses.     Heart sounds: Heart sounds not distant. No murmur heard.    No friction rub. No gallop.     Comments: No peripheral edema Pulmonary:     Effort: Pulmonary effort is normal. No respiratory distress.     Breath sounds: Normal breath sounds.  Skin:    General: Skin is warm and dry.     Findings: No rash.  Psychiatric:        Speech: Speech normal.        Behavior: Behavior normal.        Thought Content: Thought content normal.       Results for orders placed or performed in visit on 02/12/22  Microscopic Examination   Urine  Result Value Ref Range   WBC, UA 0-5 0 - 5 /hpf   RBC, Urine 0-2 0 - 2 /hpf   Epithelial Cells (non renal) 0-10 0 - 10 /hpf   Bacteria, UA None seen None seen/Few  Urinalysis, Complete  Result Value Ref Range   Specific Gravity, UA <1.005 (L) 1.005 - 1.030   pH, UA 6.5 5.0 - 7.5   Color, UA Yellow Yellow   Appearance Ur Clear Clear   Leukocytes,UA Negative Negative   Protein,UA Negative Negative/Trace   Glucose, UA Negative Negative   Ketones, UA Negative Negative   RBC, UA Negative Negative   Bilirubin, UA Negative Negative   Urobilinogen, Ur 0.2 0.2 - 1.0 mg/dL    Nitrite, UA Negative Negative   Microscopic Examination See below:   BLADDER SCAN AMB NON-IMAGING  Result Value Ref Range   Scan Result 58m  Assessment and Plan  Acute bronchitis, unspecified organism Assessment & Plan: Acute, some improvement with steroids and antibiotics but not fully resolved.  Will repeat course of antibiotics: Z-Pak x 5 days. Benzonatate helped significantly with cough so I will refill this.  Return and ER precautions provided for patient.   Other orders -     Azithromycin; 2 tab po x 1 day then 1 tab po daily  Dispense: 6 tablet; Refill: 0 -     Benzonatate; Take 1 capsule (200 mg total) by mouth 2 (two) times daily as needed for cough.  Dispense: 20 capsule; Refill: 0    Return if symptoms worsen or fail to improve.   Eliezer Lofts, MD

## 2022-07-09 ENCOUNTER — Encounter: Payer: Self-pay | Admitting: Pulmonary Disease

## 2022-07-09 ENCOUNTER — Ambulatory Visit: Payer: Medicare PPO | Admitting: Pulmonary Disease

## 2022-07-09 VITALS — BP 122/80 | HR 62 | Temp 97.3°F | Ht 73.0 in | Wt 224.0 lb

## 2022-07-09 DIAGNOSIS — R911 Solitary pulmonary nodule: Secondary | ICD-10-CM

## 2022-07-09 DIAGNOSIS — K219 Gastro-esophageal reflux disease without esophagitis: Secondary | ICD-10-CM | POA: Diagnosis not present

## 2022-07-09 DIAGNOSIS — K449 Diaphragmatic hernia without obstruction or gangrene: Secondary | ICD-10-CM | POA: Diagnosis not present

## 2022-07-09 DIAGNOSIS — R1319 Other dysphagia: Secondary | ICD-10-CM

## 2022-07-09 NOTE — Progress Notes (Signed)
Subjective:    Patient ID: Joshua Lang, male    DOB: 04/23/1956, 67 y.o.   MRN: [id number] Patient Care Team: Owens Loffler, MD as PCP - General Owens Loffler, MD as Consulting Physician (Family Medicine) Bary Castilla, Forest Gleason, MD (General Surgery) Kate Sable, MD as Consulting Physician (Cardiology)  Chief Complaint  Patient presents with   Follow-up    Nodule.   HPI Patient is a 67 year old lifelong never smoker who presents for follow-up of a lung nodule.  He was initially seen here on 16 Sep 2021.  At that time he was having issues with gastroesophageal reflux in the setting of a significant hiatal hernia.  He was given a trial of Nexium and referred to GI.  He states that he has never heard from the gastroenterology referral.  He does feel that his reflux has improved with Nexium.  He had a CT chest without contrast on 20 March 2022 which showed that the nodule was slightly more prominent in size measuring 9 x 10 mm.  It was unchanged from the 28 August 2021 study but minimally enlarged from the 07 May 2021 study.  The patient is totally asymptomatic with regards to this nodule.  He has not had any weight loss or anorexia, no cough or sputum production, no hemoptysis.  No shortness of breath, wheezing, no chest pain.  Orthopnea, paroxysmal nocturnal dyspnea, lower extremity edema or calf tenderness.  No other concern voiced.  Overall he feels well and looks well.   Review of Systems A 10 point review of systems was performed and it is as noted above otherwise negative.  Patient Active Problem List   Diagnosis Date Noted   Coronary artery disease involving native coronary artery of native heart without angina pectoris 05/18/2021   Benign prostatic hyperplasia without lower urinary tract symptoms 03/10/2017   Hernia of abdominal cavity 01/23/2016   S/P total hip arthroplasty 05/20/2015   Acute bronchitis 03/09/2014   ALLERGIC RHINITIS 02/17/2009   ERECTILE  DYSFUNCTION, 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   UNEQUAL LEG LENGTH 02/13/2009   Sleep apnea 02/13/2009   COLONIC POLYPS 10/30/2008   HYPERCHOLESTEROLEMIA 10/30/2008   NEPHROLITHIASIS 10/30/2008   Social History   Tobacco Use   Smoking status: Never    Passive exposure: Never   Smokeless tobacco: Never  Substance Use Topics   Alcohol use: Yes    Comment: occassional beer   Allergies  Allergen Reactions   Terbinafine And Related     Trouble sleeping, depression & fatigue   Tramadol Itching   Antihistamines, Chlorpheniramine-Type Other (See Comments)    Urinary frequency/urgency.    Oxycodone Itching    And hyper feeling/mind races "Hyper feeling"    Current Meds  Medication Sig   Ascorbic Acid (VITAMIN C WITH ROSE HIPS) 500 MG tablet Take 500 mg by mouth daily.   aspirin 81 MG chewable tablet Chew 81 mg by mouth at bedtime.    atorvastatin (LIPITOR) 40 MG tablet Take 1 tablet (40 mg total) by mouth daily.   B Complex-C (B-COMPLEX WITH VITAMIN C) tablet Take 1 tablet by mouth daily.   Cholecalciferol (VITAMIN D3) 50 MCG (2000 UT) TABS Take 1 tablet by mouth daily.   Cranberry 500 MG TABS Take 1 tablet by mouth in the morning and at bedtime.   esomeprazole (NEXIUM) 40 MG capsule TAKE 1 CAPSULE(40 MG) BY MOUTH DAILY   fluocinonide cream (LIDEX) 0.05 % Apply bid as directed   Magnesium Gluconate (MAG-G PO) Take by mouth.  Menaquinone-7 (K2 PO) Take 1 tablet by mouth daily.   Multiple Vitamin (MULTIVITAMIN WITH MINERALS) TABS tablet Take 1 tablet by mouth 2 (two) times daily.   Omega-3 1000 MG CAPS Take 1 capsule by mouth daily.   Probiotic Product (SUPER PROBIOTIC PO) Take 1 tablet by mouth daily.   Saw Palmetto 450 MG CAPS Take 450 mg by mouth 2 (two) times daily.    sildenafil (REVATIO) 20 MG tablet Take 2 to 5 tablets by mouth 30 minutes prior to intercourse.   tamsulosin (FLOMAX) 0.4 MG CAPS capsule Take 1 capsule (0.4 mg total) by mouth daily.   Immunization History  Administered  Date(s) Administered   Fluad Quad(high Dose 65+) 02/02/2022   Influenza Whole 02/08/2009, 02/07/2012   Influenza, Seasonal, Injecte, Preservative Fre 01/08/2014   Influenza,inj,Quad PF,6+ Mos 01/09/2015, 01/01/2016, 01/14/2017, 02/03/2018, 01/10/2019, 04/23/2020, 01/21/2021   PFIZER(Purple Top)SARS-COV-2 Vaccination 07/27/2019, 08/17/2019, 04/26/2020   Pneumococcal Conjugate-13 04/23/2015   Pneumococcal Polysaccharide-23 03/28/2016   Td 02/13/2009   Tdap 01/01/2016   Zoster Recombinat (Shingrix) 01/14/2017, 03/23/2017   Zoster, Live 06/22/2013       Objective:   Physical Exam BP 122/80 (BP Location: Left Arm, Cuff Size: Large)   Pulse 62   Temp (!) 97.3 F (36.3 C)   Ht '6\' 1"'$  (1.854 m)   Wt 224 lb (101.6 kg)   SpO2 97%   BMI 29.55 kg/m   SpO2: 97 % O2 Device: None (Room air)  GENERAL: Well-developed, well-nourished gentleman, no acute distress.  Fully ambulatory, no conversational dyspnea. HEAD: Normocephalic, atraumatic.  EYES: Pupils equal, round, reactive to light.  No scleral icterus.  MOUTH: Nose/mouth/throat not examined due to institutional masking requirements. NECK: Supple. No thyromegaly. Trachea midline. No JVD.  No adenopathy. PULMONARY: Good air entry bilaterally.  No adventitious sounds. CARDIOVASCULAR: S1 and S2. Regular rate and rhythm.  No rubs, murmurs or gallops heard. ABDOMEN: Benign. MUSCULOSKELETAL: No joint deformity, no clubbing, no edema.  NEUROLOGIC: No overt focal deficit, no gait disturbance, speech is fluent. SKIN: Intact,warm,dry. PSYCH: Mood and behavior normal.  SPN Malignancy Risk Score ([facility]): 8.7%  9 mm groundglass nodule on the left upper lobe:      Assessment & Plan:     ICD-10-CM   1. Lung nodule seen on imaging study  R91.1    Has upcoming follow-up CT chest Nodify Lung ([employer]) drawn today    2. Hiatal hernia with GERD  K44.9    K21.9    Continue Nexium for now Recommend that he follow-up with GI    3. Esophageal  dysphagia  R13.19    RESOLVED with Nexium     Ordered: Nodify Lung ([employer])  Will see the patient in follow-up in 3 months time he is to call sooner should any new problems arise.  Renold Don, MD Advanced Bronchoscopy P[facility]    *This note was dictated using voice recognition software/Dragon.  Despite best efforts to proofread, errors can occur which can change the meaning. Any transcriptional errors that result from this process are unintentional and may not be fully corrected at the time of dictation.

## 2022-07-09 NOTE — Patient Instructions (Signed)
We will let you know the results of the blood test done today as soon as these are available.  Usually takes about a week.  You have already been scheduled for a follow-up CT they will call you sooner to that time to get it done.  Will see you in follow-up in 3 months time call sooner should any new problems arise.

## 2022-07-10 DIAGNOSIS — R911 Solitary pulmonary nodule: Secondary | ICD-10-CM | POA: Diagnosis not present

## 2022-07-15 DIAGNOSIS — R911 Solitary pulmonary nodule: Secondary | ICD-10-CM | POA: Diagnosis not present

## 2022-07-17 ENCOUNTER — Telehealth: Payer: Self-pay

## 2022-07-17 NOTE — Telephone Encounter (Signed)
Nodify test reviewed by Dr. Frutoso Chase did not show increase concern. Will continue to follow nodule.   Patient is aware of results and voiced his understanding.  Nothing further needed.

## 2022-07-23 ENCOUNTER — Other Ambulatory Visit: Payer: Self-pay | Admitting: Family Medicine

## 2022-07-23 DIAGNOSIS — R739 Hyperglycemia, unspecified: Secondary | ICD-10-CM

## 2022-07-23 DIAGNOSIS — Z125 Encounter for screening for malignant neoplasm of prostate: Secondary | ICD-10-CM

## 2022-07-23 DIAGNOSIS — Z79899 Other long term (current) drug therapy: Secondary | ICD-10-CM

## 2022-07-23 DIAGNOSIS — E7800 Pure hypercholesterolemia, unspecified: Secondary | ICD-10-CM

## 2022-07-27 ENCOUNTER — Other Ambulatory Visit (INDEPENDENT_AMBULATORY_CARE_PROVIDER_SITE_OTHER): Payer: Medicare PPO

## 2022-07-27 DIAGNOSIS — R739 Hyperglycemia, unspecified: Secondary | ICD-10-CM | POA: Diagnosis not present

## 2022-07-27 DIAGNOSIS — Z125 Encounter for screening for malignant neoplasm of prostate: Secondary | ICD-10-CM

## 2022-07-27 DIAGNOSIS — E7800 Pure hypercholesterolemia, unspecified: Secondary | ICD-10-CM | POA: Diagnosis not present

## 2022-07-27 DIAGNOSIS — Z79899 Other long term (current) drug therapy: Secondary | ICD-10-CM | POA: Diagnosis not present

## 2022-07-27 LAB — CBC WITH DIFFERENTIAL/PLATELET
Basophils Absolute: 0 10*3/uL (ref 0.0–0.1)
Basophils Relative: 0.5 % (ref 0.0–3.0)
Eosinophils Absolute: 0.1 10*3/uL (ref 0.0–0.7)
Eosinophils Relative: 2 % (ref 0.0–5.0)
HCT: 44.5 % (ref 39.0–52.0)
Hemoglobin: 15.1 g/dL (ref 13.0–17.0)
Lymphocytes Relative: 33 % (ref 12.0–46.0)
Lymphs Abs: 1.4 10*3/uL (ref 0.7–4.0)
MCHC: 33.8 g/dL (ref 30.0–36.0)
MCV: 93.5 fl (ref 78.0–100.0)
Monocytes Absolute: 0.4 10*3/uL (ref 0.1–1.0)
Monocytes Relative: 9.7 % (ref 3.0–12.0)
Neutro Abs: 2.3 10*3/uL (ref 1.4–7.7)
Neutrophils Relative %: 54.8 % (ref 43.0–77.0)
Platelets: 199 10*3/uL (ref 150.0–400.0)
RBC: 4.77 Mil/uL (ref 4.22–5.81)
RDW: 14.9 % (ref 11.5–15.5)
WBC: 4.3 10*3/uL (ref 4.0–10.5)

## 2022-07-27 LAB — HEPATIC FUNCTION PANEL
ALT: 22 U/L (ref 0–53)
AST: 21 U/L (ref 0–37)
Albumin: 4.3 g/dL (ref 3.5–5.2)
Alkaline Phosphatase: 53 U/L (ref 39–117)
Bilirubin, Direct: 0.1 mg/dL (ref 0.0–0.3)
Total Bilirubin: 0.6 mg/dL (ref 0.2–1.2)
Total Protein: 7.2 g/dL (ref 6.0–8.3)

## 2022-07-27 LAB — BASIC METABOLIC PANEL
BUN: 22 mg/dL (ref 6–23)
CO2: 28 mEq/L (ref 19–32)
Calcium: 9.5 mg/dL (ref 8.4–10.5)
Chloride: 105 mEq/L (ref 96–112)
Creatinine, Ser: 0.87 mg/dL (ref 0.40–1.50)
GFR: 90.36 mL/min (ref >60.00)
Glucose, Bld: 100 mg/dL — ABNORMAL HIGH (ref 70–99)
Potassium: 4.1 mEq/L (ref 3.5–5.1)
Sodium: 141 mEq/L (ref 135–145)

## 2022-07-27 LAB — LIPID PANEL
Cholesterol: 156 mg/dL (ref 0–200)
HDL: 60.4 mg/dL (ref >39.00)
LDL Cholesterol: 85 mg/dL (ref 0–99)
NonHDL: 95.62
Total CHOL/HDL Ratio: 3
Triglycerides: 53 mg/dL (ref 0.0–149.0)
VLDL: 10.6 mg/dL (ref 0.0–40.0)

## 2022-07-27 LAB — HEMOGLOBIN A1C: Hgb A1c MFr Bld: 5.8 % (ref 4.6–6.5)

## 2022-07-27 LAB — PSA, MEDICARE: PSA: 0.67 ng/ml (ref 0.10–4.00)

## 2022-08-03 ENCOUNTER — Encounter: Payer: BC Managed Care – PPO | Admitting: Family Medicine

## 2022-08-10 ENCOUNTER — Encounter: Payer: BC Managed Care – PPO | Admitting: Family Medicine

## 2022-08-11 NOTE — Progress Notes (Signed)
Joshua Lang T. Edvin Albus, MD, CAQ Sports Medicine [facility] at [facility] [address] [place], [address]  Phone: [phone]  FAX: [phone]  Joshua Lang - 67 y.o. male  MRN [id number]  Date of Birth: [birth year]  Date: 08/13/2022  PCP: Hannah Beat, MD  Referral: Hannah Beat, MD  Chief Complaint  Patient presents with   Welcome to Medicare   Patient Care Team: Hannah Beat, MD as PCP - General Hannah Beat, MD as Consulting Physician (Family Medicine) Lemar Livings, Merrily Pew, MD (General Surgery) Debbe Odea, MD as Consulting Physician (Cardiology) Subjective:   ZYMIR Joshua Lang is a 67 y.o. pleasant patient who presents for a Welcome to [facility] appointment:  Preventative Health Maintenance Visit:  Health Maintenance Summary Reviewed and updated, unless pt declines services.  Tobacco History Reviewed. Alcohol: No concerns, no excessive use Exercise Habits: goes to the gym several days a week STD concerns: no risk or activity to increase risk Drug Use: None  Just got back from [place].   L shoulder is hurting X 5-6 days  Questions about salt intake  Cough medication - prefers codeine not Tussionex if needed.   Health Maintenance  Topic Date Due   Medicare Annual Wellness (AWV)  Never done   COVID-19 Vaccine (5 - 2023-24 season) 09/04/2022   INFLUENZA VACCINE  12/10/2022   COLONOSCOPY (Pts 45-57yrs Insurance coverage will need to be confirmed)  12/21/2024   DTaP/Tdap/Td (3 - Td or Tdap) 12/31/2025   Pneumonia Vaccine 65+ Years old  Completed   Hepatitis C Screening  Completed   HIV Screening  Completed   Zoster Vaccines- Shingrix  Completed   HPV VACCINES  Aged Out    Immunization History  Administered Date(s) Administered   COVID-19, mRNA, vaccine(Comirnaty)12 years and older 07/10/2022   Fluad Quad(high Dose 65+) 02/02/2022   Influenza Whole 02/08/2009, 02/07/2012   Influenza, Seasonal, Injecte,  Preservative Fre 01/08/2014   Influenza,inj,Quad PF,6+ Mos 01/09/2015, 01/01/2016, 01/14/2017, 02/03/2018, 01/10/2019, 04/23/2020, 01/21/2021   PFIZER(Purple Top)SARS-COV-2 Vaccination 07/27/2019, 08/17/2019, 04/26/2020   PNEUMOCOCCAL CONJUGATE-20 08/13/2022   Pneumococcal Conjugate-13 04/23/2015   Pneumococcal Polysaccharide-23 03/28/2016   Td 02/13/2009   Tdap 01/01/2016   Zoster Recombinat (Shingrix) 01/14/2017, 03/23/2017   Zoster, Live 06/22/2013    Patient Active Problem List   Diagnosis Date Noted   Coronary artery disease involving native coronary artery of native heart without angina pectoris 05/18/2021    Priority: High   Sleep apnea 02/13/2009    Priority: High   HYPERCHOLESTEROLEMIA 10/30/2008    Priority: High   NEPHROLITHIASIS 10/30/2008    Priority: Medium    Benign prostatic hyperplasia without lower urinary tract symptoms 03/10/2017    Priority: Low   Hernia of abdominal cavity 01/23/2016    Priority: Low   ALLERGIC RHINITIS 02/17/2009    Priority: Low   UNEQUAL LEG LENGTH 02/13/2009    Priority: Low   COLONIC POLYPS 10/30/2008    Priority: Low   S/P total hip arthroplasty 05/20/2015   ERECTILE DYSFUNCTION, 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    Past Medical History:  Diagnosis Date   Allergic rhinitis    Colonic polyp    Complication of anesthesia    WITH 1ST HERNIA SURGERY PT STATES HE WAS CHOKING WITH TUBE IN   ED (erectile dysfunction)    Enlarged prostate    Moderately   GERD (gastroesophageal reflux disease)    History of kidney stones    H/O   Hypercholesteremia     Past  Surgical History:  Procedure Laterality Date   COLONOSCOPY  2015   HERNIA REPAIR Left 1990's   X2   INGUINAL HERNIA REPAIR Right 03/24/2016   Procedure: HERNIA REPAIR INGUINAL ADULT;  Surgeon: Earline Mayotte, MD;  Location: [facility] ORS;  Service: General;  Laterality: Right;   JOINT REPLACEMENT Bilateral    THR   SHOULDER ARTHROSCOPY WITH OPEN ROTATOR CUFF REPAIR Right 03/22/2018    Procedure: SHOULDER ARTHROSCOPY WITH OPEN ROTATOR CUFF REPAIR;  Surgeon: Christena Flake, MD;  Location: [facility] ORS;  Service: Orthopedics;  Laterality: Right;   SHOULDER OPEN ROTATOR CUFF REPAIR  2014   [place]en, partial RTC tear with probably SAD, possible DCE   SHOULDER SURGERY  2009   open, 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, (Jim Hooten)   TOTAL HIP ARTHROPLASTY  09/2009   (Hooten)   TOTAL HIP ARTHROPLASTY Left 05/20/2015   Procedure: TOTAL HIP ARTHROPLASTY;  Surgeon: Donato Heinz, MD;  Location: [facility] ORS;  Service: Orthopedics;  Laterality: Left;    Family History  Problem Relation Age of Onset   Prostate cancer Father    Migraines Sister     Social History   Social History Narrative   ** Merged History Encounter **        Past Medical History, Surgical History, Social History, Family History, Problem List, Medications, and Allergies have been reviewed and updated if relevant.  Review of Systems: Pertinent positives are listed above.  Otherwise, a full 14 point review of systems has been done in full and it is negative except where it is noted positive.  Objective:   BP 100/62   Pulse (!) 58   Temp 98 F (36.7 C) (Temporal)   Ht 6\' 1"  (1.854 m)   Wt 224 lb 6 oz (101.8 kg)   SpO2 95%   BMI 29.60 kg/m     07/13/2016    2:54 PM 03/14/2021    7:08 PM 05/29/2022    2:16 PM 08/13/2022   10:47 AM  Fall Risk  Falls in the past year? No  0 0  Was there an injury with Fall?   0 0  Fall Risk Category Calculator   0 0  (RETIRED) Patient Fall Risk Level  Low fall risk    Patient at Risk for Falls Due to   No Fall Risks No Fall Risks  Fall risk Follow up   Falls evaluation completed Falls evaluation completed   Ideal Body Weight: Weight in (lb) to have BMI = 25: 189.1 Hearing Screening  Method: Audiometry   500Hz  1000Hz  2000Hz  4000Hz   Right ear 20 20 20 20   Left ear 20 20 20 20    Vision Screening   Right eye Left eye Both eyes  Without correction 20/40 20/15 20/15   With correction          08/13/2022   10:47 AM 05/29/2022    2:17 PM 07/30/2021   11:26 AM 07/29/2020    9:09 AM 07/26/2019    9:19 AM  Depression screen PHQ 2/9  Decreased Interest 0 1 0 0 0  Down, Depressed, Hopeless 0 0 0 0 0  PHQ - 2 Score 0 1 0 0 0     GEN: well developed, well nourished, no acute distress Eyes: conjunctiva and lids normal, PERRLA, EOMI ENT: TM clear, nares clear, oral exam WNL Neck: supple, no lymphadenopathy, no thyromegaly, no JVD Pulm: clear to auscultation and percussion, respiratory effort normal CV: regular rate and rhythm, S1-S2, no murmur, rub or gallop, no  bruits, peripheral pulses normal and symmetric, no cyanosis, clubbing, edema or varicosities GI: soft, non-tender; no hepatosplenomegaly, masses; active bowel sounds all quadrants GU: deferred Lymph: no cervical, axillary or inguinal adenopathy MSK: gait normal, muscle tone and strength WNL, no joint swelling, effusions, discoloration, crepitus  Does have a mild left-sided painful arc of motion.  Strength is 5/5 throughout.  Negative speeds, Yergason's, Jobe, [employer], and Neer testing  SKIN: clear, good turgor, color WNL, no rashes, lesions, or ulcerations Neuro: normal mental status, normal strength, sensation, and motion Psych: alert; oriented to person, place and time, normally interactive and not anxious or depressed in appearance.  All labs reviewed with patient.  Results for orders placed or performed in visit on 07/27/22  PSA, Medicare  Result Value Ref Range   PSA 0.67 0.10 - 4.00 ng/ml  Lipid panel  Result Value Ref Range   Cholesterol 156 0 - 200 mg/dL   Triglycerides 16.153.0 0.0 - 149.0 mg/dL   HDL 09.6060.40 >45.40>39.00 mg/dL   VLDL 98.110.6 0.0 - 19.140.0 mg/dL   LDL Cholesterol 85 0 - 99 mg/dL   Total CHOL/HDL Ratio 3    NonHDL 95.62   Hemoglobin A1c  Result Value Ref Range   Hgb A1c MFr Bld 5.8 4.6 - 6.5 %  Hepatic function panel  Result Value Ref Range   Total Bilirubin 0.6 0.2 - 1.2 mg/dL   Bilirubin, Direct  0.1 0.0 - 0.3 mg/dL   Alkaline Phosphatase 53 39 - 117 U/L   AST 21 0 - 37 U/L   ALT 22 0 - 53 U/L   Total Protein 7.2 6.0 - 8.3 g/dL   Albumin 4.3 3.5 - 5.2 g/dL  CBC with Differential/Platelet  Result Value Ref Range   WBC 4.3 4.0 - 10.5 K/uL   RBC 4.77 4.22 - 5.81 Mil/uL   Hemoglobin 15.1 13.0 - 17.0 g/dL   HCT 47.844.5 29.539.0 - 62.152.0 %   MCV 93.5 78.0 - 100.0 fl   MCHC 33.8 30.0 - 36.0 g/dL   RDW 30.814.9 65.711.5 - 84.615.5 %   Platelets 199.0 150.0 - 400.0 K/uL   Neutrophils Relative % 54.8 43.0 - 77.0 %   Lymphocytes Relative 33.0 12.0 - 46.0 %   Monocytes Relative 9.7 3.0 - 12.0 %   Eosinophils Relative 2.0 0.0 - 5.0 %   Basophils Relative 0.5 0.0 - 3.0 %   Neutro Abs 2.3 1.4 - 7.7 K/uL   Lymphs Abs 1.4 0.7 - 4.0 K/uL   Monocytes Absolute 0.4 0.1 - 1.0 K/uL   Eosinophils Absolute 0.1 0.0 - 0.7 K/uL   Basophils Absolute 0.0 0.0 - 0.1 K/uL  Basic metabolic panel  Result Value Ref Range   Sodium 141 135 - 145 mEq/L   Potassium 4.1 3.5 - 5.1 mEq/L   Chloride 105 96 - 112 mEq/L   CO2 28 19 - 32 mEq/L   Glucose, Bld 100 (H) 70 - 99 mg/dL   BUN 22 6 - 23 mg/dL   Creatinine, Ser 9.620.87 0.40 - 1.50 mg/dL   GFR 95.2890.36 >41.32>60.00 mL/min   Calcium 9.5 8.4 - 10.5 mg/dL    Assessment and Plan:     ICD-10-CM   1. Healthcare maintenance  Z00.00     2. Need for vaccination against Streptococcus pneumoniae  Z23 Pneumococcal conjugate vaccine 20-valent (Prevnar 20)     He is globally doing well.  We reviewed all of his data in chart for Medicare wellness exam.  Update Prevnar 20 today.  He does have some mild rotator cuff irritation on the left, but is only been a few days.  I encouraged him to modify his activities a little bit and take some ibuprofen or Aleve.   Health Maintenance Exam: The patient's preventative maintenance and recommended screening tests for an annual wellness exam were reviewed in full today. Brought up to date unless services declined.  Counselled on the importance of diet,  exercise, and its role in overall health and mortality. The patient's FH and SH was reviewed, including their home life, tobacco status, and drug and alcohol status.  Follow-up in 1 year for physical exam or additional follow-up below.  I have personally reviewed the Medicare Annual Wellness questionnaire and have noted 1. The patient's medical and social history 2. Their use of alcohol, tobacco or illicit drugs 3. Their current medications and supplements 4. The patient's functional ability including ADL's, fall risks, home safety risks and hearing or visual             impairment. 5. Diet and physical activities 6. Evidence for depression or mood disorders 7. Reviewed Updated provider list, see scanned forms and [facility] Snapshot.  8. Reviewed whether or not the patient has HCPOA or living will, and discussed what this means with the patient.  Recommended he bring in a copy for his chart in [facility].  The patients weight, height, BMI and visual acuity have been recorded in the chart I have made referrals, counseling and provided education to the patient based review of the above and I have provided the pt with a written personalized care plan for preventive services.  I have provided the patient with a copy of your personalized plan for preventive services. Instructed to take the time to review along with their updated medication list.  Disposition: No follow-ups on file.  Future Appointments  Date Time Provider Department Center  10/26/2022 10:00 AM Salena Saner, MD [facility] None  02/18/2023  1:00 PM Sondra Come, MD [facility] None  08/09/2023 10:00 AM [facility]  08/16/2023 10:40 AM Alexsia Klindt, Karleen Hampshire, MD [facility]    Meds ordered this encounter  Medications   atorvastatin (LIPITOR) 40 MG tablet    Sig: Take 1 tablet (40 mg total) by mouth daily.    Dispense:  90 tablet    Refill:  3   DISCONTD: sildenafil (REVATIO) 20 MG tablet    Sig: Take 2 to 5 tablets by  mouth 30 minutes prior to intercourse.    Dispense:  90 tablet    Refill:  3   tamsulosin (FLOMAX) 0.4 MG CAPS capsule    Sig: Take 1 capsule (0.4 mg total) by mouth daily.    Dispense:  90 capsule    Refill:  3   sildenafil (REVATIO) 20 MG tablet    Sig: Take 2 to 5 tablets by mouth 30 minutes prior to intercourse.    Dispense:  90 tablet    Refill:  3   Medications Discontinued During This Encounter  Medication Reason   benzonatate (TESSALON) 100 MG capsule Completed Course   azithromycin (ZITHROMAX) 250 MG tablet Completed Course   benzonatate (TESSALON) 200 MG capsule Completed Course   atorvastatin (LIPITOR) 40 MG tablet Reorder   sildenafil (REVATIO) 20 MG tablet Reorder   tamsulosin (FLOMAX) 0.4 MG CAPS capsule Reorder   sildenafil (REVATIO) 20 MG tablet Reorder   Orders Placed This Encounter  Procedures   Pneumococcal conjugate vaccine 20-valent (Prevnar 20)    Signed,  Jisel Fleet T. Demetra Moya, MD   Allergies as of 08/13/2022       Reactions   Terbinafine And Related    Trouble sleeping, depression & fatigue   Tramadol Itching   Antihistamines, Chlorpheniramine-type Other (See Comments)   Urinary frequency/urgency.   Oxycodone Itching   And hyper feeling/mind races "Hyper feeling"        Medication List        Accurate as of August 13, 2022 11:59 PM. If you have any questions, ask your nurse or doctor.          STOP taking these medications    azithromycin 250 MG tablet Commonly known as: ZITHROMAX Stopped by: Hannah BeatSpencer Keneth Borg, MD   benzonatate 100 MG capsule Commonly known as: TESSALON Stopped by: Hannah BeatSpencer Ladasha Schnackenberg, MD   benzonatate 200 MG capsule Commonly known as: TESSALON Stopped by: Hannah BeatSpencer Ginevra Tacker, MD       TAKE these medications    aspirin 81 MG chewable tablet Chew 81 mg by mouth at bedtime.   atorvastatin 40 MG tablet Commonly known as: LIPITOR Take 1 tablet (40 mg total) by mouth daily.   B-complex with vitamin C tablet Take 1  tablet by mouth daily.   Cranberry 500 MG Tabs Take 1 tablet by mouth in the morning and at bedtime.   esomeprazole 40 MG capsule Commonly known as: NEXIUM TAKE 1 CAPSULE(40 MG) BY MOUTH DAILY   fluocinonide cream 0.05 % Commonly known as: LIDEX Apply bid as directed   K2 PO Take 1 tablet by mouth daily.   MAG-G PO Take by mouth.   multivitamin with minerals Tabs tablet Take 1 tablet by mouth 2 (two) times daily.   Omega-3 1000 MG Caps Take 1 capsule by mouth daily.   Saw Palmetto 450 MG Caps Take 450 mg by mouth 2 (two) times daily.   sildenafil 20 MG tablet Commonly known as: REVATIO Take 2 to 5 tablets by mouth 30 minutes prior to intercourse.   SUPER PROBIOTIC PO Take 1 tablet by mouth daily.   tamsulosin 0.4 MG Caps capsule Commonly known as: FLOMAX Take 1 capsule (0.4 mg total) by mouth daily.   vitamin C with rose hips 500 MG tablet Take 500 mg by mouth daily.   Vitamin D3 50 MCG (2000 UT) Tabs Generic drug: Cholecalciferol Take 1 tablet by mouth daily.

## 2022-08-13 ENCOUNTER — Ambulatory Visit (INDEPENDENT_AMBULATORY_CARE_PROVIDER_SITE_OTHER): Payer: Medicare PPO | Admitting: Family Medicine

## 2022-08-13 ENCOUNTER — Encounter: Payer: Self-pay | Admitting: Family Medicine

## 2022-08-13 VITALS — BP 100/62 | HR 58 | Temp 98.0°F | Ht 73.0 in | Wt 224.4 lb

## 2022-08-13 DIAGNOSIS — Z23 Encounter for immunization: Secondary | ICD-10-CM

## 2022-08-13 DIAGNOSIS — Z0000 Encounter for general adult medical examination without abnormal findings: Secondary | ICD-10-CM

## 2022-08-13 MED ORDER — TAMSULOSIN HCL 0.4 MG PO CAPS: 0.4000 mg | ORAL_CAPSULE | Freq: Every day | ORAL | 3 refills | Status: DC

## 2022-08-13 MED ORDER — SILDENAFIL CITRATE 20 MG PO TABS: ORAL_TABLET | ORAL | 3 refills | Status: DC

## 2022-08-13 MED ORDER — ATORVASTATIN CALCIUM 40 MG PO TABS: 40.0000 mg | ORAL_TABLET | Freq: Every day | ORAL | 3 refills | Status: DC

## 2022-08-17 ENCOUNTER — Telehealth: Payer: Self-pay | Admitting: Family Medicine

## 2022-08-17 ENCOUNTER — Other Ambulatory Visit: Payer: Self-pay | Admitting: Family Medicine

## 2022-08-17 NOTE — Telephone Encounter (Signed)
Patient called in stating that he got a pneumonia shot on Thursday when he was here in his left arm. He was stating that he has had a headache, pain in his teeth and body aches on the left side. He was just wondering if this normal or if the shot could of hit a nerve. He would like a call to see if this is normal. He can be reached ar [phone]. Thank you!

## 2022-08-17 NOTE — Telephone Encounter (Signed)
I spoke with pt and on 08/13/22 pt received prevnar 20 and pt said he is having h/a, pain in teeth and body aches on lt side and per Pneumococcal Conjugate vaccine VIS I advised pt for 2 - 3 days can possibly have h/a and muscle and joint pains. Pt said he saw dentist last wk and xrays appeared ok so pt not sure why had tooth pain. Pt said today he does feel better this morning so far. Pt said ibuprofen and tylenol do help the aches and h/a. Pt said heat seems to help tooth pain. Advised pt if he has concern and wants to be seen I can schedule appt. Pt said since he is feeling better today he will wait and see how he does and if h/a and body aches continue he will cb for appt. Sending note as FYI to Dr Patsy Lager and [facility] [facility].

## 2022-08-17 NOTE — Telephone Encounter (Signed)
Sounds like he is having a vaccine reaction, and is having some persistent aches pains, and other symptoms after vaccination.  Sounds as if that is starting to prove today, and that would follow-up with the traditional vaccine reaction.  At this point, I do not think that he needs to do anything additional besides taking some ad lib. Tylenol and ibuprofen.

## 2022-08-17 NOTE — Telephone Encounter (Signed)
Mr. Malicki notified as instructed by telephone.  Patient states understanding. 

## 2022-08-18 ENCOUNTER — Telehealth: Payer: Self-pay | Admitting: Family Medicine

## 2022-08-18 ENCOUNTER — Encounter: Payer: Self-pay | Admitting: Family Medicine

## 2022-08-18 ENCOUNTER — Ambulatory Visit (INDEPENDENT_AMBULATORY_CARE_PROVIDER_SITE_OTHER): Payer: Medicare PPO | Admitting: Family Medicine

## 2022-08-18 VITALS — BP 118/76 | HR 60 | Temp 97.7°F | Ht 73.0 in | Wt 223.0 lb

## 2022-08-18 DIAGNOSIS — T50Z95A Adverse effect of other vaccines and biological substances, initial encounter: Secondary | ICD-10-CM

## 2022-08-18 DIAGNOSIS — R519 Headache, unspecified: Secondary | ICD-10-CM | POA: Diagnosis not present

## 2022-08-18 MED ORDER — CYCLOBENZAPRINE HCL 10 MG PO TABS: 5.0000 mg | ORAL_TABLET | Freq: Three times a day (TID) | ORAL | 0 refills | Status: DC | PRN

## 2022-08-18 NOTE — Telephone Encounter (Signed)
Patient receive the pneumonia vaccine on 4/4/024,he called in today stating that he has been having a severe headache, body aches,and mind racing. He would like to know what could he do to help ease theses pains,or if any medication should be called in for him?Please advise.

## 2022-08-18 NOTE — Assessment & Plan Note (Addendum)
Pt has had a systemic vaccine reaction to prevnar 20 (no allergic symptoms / no anaphylaxis)  Started with arm pain- progressed to L sided body and head pain  No fever  Nausea but no vomiting and is hydrated  Reassuring exam and vitals today  Disc tx of symptoms until they improve- will try ibuprofen (with food)  Px flexeril for muscle ache and HA - try at night  Ice pack for head prn  ER precautions reviewed in detail  Will alert pcp as well  Prevnar 20 added to intolerance list   Reviewed chart today No hx of reaction to prevnar 13 in past  Generally does ok with vaccines   Update if not starting to improve in a week or if worsening

## 2022-08-18 NOTE — Telephone Encounter (Signed)
Spoke with Joshua Lang and scheduled him to see Dr. Milinda Antis today at 4:00 pm.

## 2022-08-18 NOTE — Patient Instructions (Addendum)
Try ibuprofen 200-400 mg over the counter for headache and pain with a full stomach If it does bother your GI system then use tylenol instead   Try the muscle relaxer (flexeril) with caution of sedation  I think it will help aches/ sleep and headache   Watch for fever  Watch for vomiting  If headache becomes unbearable go to the ER   Stay hydrated / keep sipping on fluids   Update if not starting to improve in a week or if worsening

## 2022-08-18 NOTE — Progress Notes (Signed)
Subjective:    Patient ID: Joshua Lang, male    DOB: 12-Aug-1955, 67 y.o.   MRN: [id number]  HPI 67 yo pt of Dr Patsy Lager presents for reaction to vaccine   Wt Readings from Last 3 Encounters:  08/18/22 223 lb (101.2 kg)  08/13/22 224 lb 6 oz (101.8 kg)  07/09/22 224 lb (101.6 kg)   29.42 kg/m  Vitals:   08/18/22 1541  BP: 118/76  Pulse: 60  Temp: 97.7 F (36.5 C)  SpO2: 97%     Pt had prevnar 20 vaccine on 4/9 during annual physical  Had the shot aroud 11 am  Very sore immediately - arm really bothered him   That evening -he got sensitive to some cold xray  Next day- dental pain on the L side (dentist had just done xrays and they were fine)  Then discomfort in arm- then whole body  Headache - worse on Monday (very sharp)  Then some nausea   Today- some mouth discomfort- is improved but still there  Headache -no better / left frontal  Constant and sharp but varies a bit through the day  No ear pain  Arm is a little tender (slight)- not warm or red   Has not checked temp  Has had some chills / some cold and hot   Nausea but no vomiting     Cannot sleep well due to discomfort   Last night took an antihistamine /sleep aid= helped a little    Of note had pna 13 vaccine in 2016   In the past his 2nd shingrix vaccine- felt tired     Used a warm compress on face  Tylenol  Some ibuprofen - may have made him nauseated   Lab Results  Component Value Date   CREATININE 0.87 07/27/2022   BUN 22 07/27/2022   NA 141 07/27/2022   K 4.1 07/27/2022   CL 105 07/27/2022   CO2 28 07/27/2022   Lab Results  Component Value Date   ALT 22 07/27/2022   AST 21 07/27/2022   ALKPHOS 53 07/27/2022   BILITOT 0.6 07/27/2022   Lab Results  Component Value Date   WBC 4.3 07/27/2022   HGB 15.1 07/27/2022   HCT 44.5 07/27/2022   MCV 93.5 07/27/2022   PLT 199.0 07/27/2022   Lab Results  Component Value Date   TSH 1.23 03/12/2010   Lab Results  Component Value  Date   CALCIUM 9.5 07/27/2022   He does take a statin for cholesterol     Patient Active Problem List   Diagnosis Date Noted   Vaccine reaction 08/18/2022   Coronary artery disease involving native coronary artery of native heart without angina pectoris 05/18/2021   Benign prostatic hyperplasia without lower urinary tract symptoms 03/10/2017   Hernia of abdominal cavity 01/23/2016   S/P total hip arthroplasty 05/20/2015   ALLERGIC RHINITIS 02/17/2009   ERECTILE DYSFUNCTION, 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   UNEQUAL LEG LENGTH 02/13/2009   Sleep apnea 02/13/2009   COLONIC POLYPS 10/30/2008   HYPERCHOLESTEROLEMIA 10/30/2008   NEPHROLITHIASIS 10/30/2008   Past Medical History:  Diagnosis Date   Allergic rhinitis    Colonic polyp    Complication of anesthesia    WITH 1ST HERNIA SURGERY PT STATES HE WAS CHOKING WITH TUBE IN   ED (erectile dysfunction)    Enlarged prostate    Moderately   GERD (gastroesophageal reflux disease)    History of kidney stones    H/O   Hypercholesteremia  Past Surgical History:  Procedure Laterality Date   COLONOSCOPY  2015   HERNIA REPAIR Left 1990's   X2   INGUINAL HERNIA REPAIR Right 03/24/2016   Procedure: HERNIA REPAIR INGUINAL ADULT;  Surgeon: Earline Mayotte, MD;  Location: [facility];  Service: General;  Laterality: Right;   JOINT REPLACEMENT Bilateral    THR   SHOULDER ARTHROSCOPY WITH OPEN ROTATOR CUFF REPAIR Right 03/22/2018   Procedure: SHOULDER ARTHROSCOPY WITH OPEN ROTATOR CUFF REPAIR;  Surgeon: Christena Flake, MD;  Location: [facility];  Service: Orthopedics;  Laterality: Right;   SHOULDER OPEN ROTATOR CUFF REPAIR  2014   Hooten, partial RTC tear with probably SAD, possible DCE   SHOULDER SURGERY  2009   open, 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, (Jim Hooten)   TOTAL HIP ARTHROPLASTY  09/2009   (Hooten)   TOTAL HIP ARTHROPLASTY Left 05/20/2015   Procedure: TOTAL HIP ARTHROPLASTY;  Surgeon: Donato Heinz, MD;  Location: [facility];  Service: Orthopedics;   Laterality: Left;   Social History   Tobacco Use   Smoking status: Never    Passive exposure: Never   Smokeless tobacco: Never  Vaping Use   Vaping Use: Never used  Substance Use Topics   Alcohol use: Yes    Comment: occassional beer   Drug use: No   Family History  Problem Relation Age of Onset   Prostate cancer Father    Migraines Sister    Allergies  Allergen Reactions   Prevnar 20 [Pneumococcal 20-Val Conj Vacc]     Achy and sick feeling    Terbinafine And Related     Trouble sleeping, depression & fatigue   Tramadol Itching   Antihistamines, Chlorpheniramine-Type Other (See Comments)    Urinary frequency/urgency.    Oxycodone Itching    And hyper feeling/mind races "Hyper feeling"    Current Outpatient Medications on File Prior to Visit  Medication Sig Dispense Refill   Ascorbic Acid (VITAMIN C WITH ROSE HIPS) 500 MG tablet Take 500 mg by mouth daily.     aspirin 81 MG chewable tablet Chew 81 mg by mouth at bedtime.      atorvastatin (LIPITOR) 40 MG tablet Take 1 tablet (40 mg total) by mouth daily. 90 tablet 3   B Complex-C (B-COMPLEX WITH VITAMIN C) tablet Take 1 tablet by mouth daily.     Cholecalciferol (VITAMIN D3) 50 MCG (2000 UT) TABS Take 1 tablet by mouth daily.     Cranberry 500 MG TABS Take 1 tablet by mouth in the morning and at bedtime.     esomeprazole (NEXIUM) 40 MG capsule TAKE 1 CAPSULE(40 MG) BY MOUTH DAILY 90 capsule 3   fluocinonide cream (LIDEX) 0.05 % Apply bid as directed 60 g 3   Magnesium Gluconate (MAG-G PO) Take by mouth.     Menaquinone-7 (K2 PO) Take 1 tablet by mouth daily.     Multiple Vitamin (MULTIVITAMIN WITH MINERALS) TABS tablet Take 1 tablet by mouth 2 (two) times daily.     Omega-3 1000 MG CAPS Take 1 capsule by mouth daily.     Probiotic Product (SUPER PROBIOTIC PO) Take 1 tablet by mouth daily.     Saw Palmetto 450 MG CAPS Take 450 mg by mouth 2 (two) times daily.      sildenafil (REVATIO) 20 MG tablet Take 2 to 5 tablets  by mouth 30 minutes prior to intercourse. 90 tablet 3   tamsulosin (FLOMAX) 0.4 MG CAPS capsule Take 1 capsule (0.4 mg total) by mouth  daily. 90 capsule 3   No current facility-administered medications on file prior to visit.     Review of Systems  Constitutional:  Positive for chills and fatigue. Negative for activity change, appetite change, fever and unexpected weight change.  HENT:  Negative for congestion, rhinorrhea, sore throat and trouble swallowing.   Eyes:  Negative for pain, redness, itching and visual disturbance.  Respiratory:  Negative for cough, chest tightness, shortness of breath and wheezing.   Cardiovascular:  Negative for chest pain, palpitations and leg swelling.  Gastrointestinal:  Positive for nausea. Negative for abdominal pain, blood in stool, constipation, diarrhea and vomiting.  Endocrine: Negative for cold intolerance, heat intolerance, polydipsia and polyuria.  Genitourinary:  Negative for difficulty urinating, dysuria, frequency and urgency.  Musculoskeletal:  Positive for arthralgias and myalgias. Negative for joint swelling.  Skin:  Negative for pallor and rash.  Neurological:  Positive for headaches. Negative for dizziness, tremors, seizures, syncope, facial asymmetry, speech difficulty, weakness, light-headedness and numbness.  Hematological:  Negative for adenopathy. Does not bruise/bleed easily.  Psychiatric/Behavioral:  Negative for decreased concentration and dysphoric mood. The patient is not nervous/anxious.        Objective:   Physical Exam Constitutional:      General: He is not in acute distress.    Appearance: Normal appearance. He is well-developed. He is obese. He is not ill-appearing or diaphoretic.  HENT:     Head: Normocephalic and atraumatic.     Comments: No facial, temporal or TM joint tenderness No swelling    Right Ear: Tympanic membrane and ear canal normal.     Left Ear: Tympanic membrane and ear canal normal.     Nose: Nose  normal.     Mouth/Throat:     Mouth: Mucous membranes are moist.     Pharynx: Oropharynx is clear. No oropharyngeal exudate or posterior oropharyngeal erythema.  Eyes:     General: No scleral icterus.       Right eye: No discharge.        Left eye: No discharge.     Conjunctiva/sclera: Conjunctivae normal.     Pupils: Pupils are equal, round, and reactive to light.  Neck:     Thyroid: No thyromegaly.     Vascular: No carotid bruit or JVD.  Cardiovascular:     Rate and Rhythm: Normal rate and regular rhythm.     Heart sounds: Normal heart sounds.     No gallop.  Pulmonary:     Effort: Pulmonary effort is normal. No respiratory distress.     Breath sounds: Normal breath sounds. No stridor. No wheezing, rhonchi or rales.  Chest:     Chest wall: No tenderness.  Abdominal:     General: There is no distension or abdominal bruit.     Palpations: Abdomen is soft. There is no mass.     Tenderness: There is no abdominal tenderness. There is no right CVA tenderness, left CVA tenderness, guarding or rebound.  Musculoskeletal:     Cervical back: Normal range of motion and neck supple. No tenderness.     Right lower leg: No edema.     Left lower leg: No edema.  Lymphadenopathy:     Cervical: No cervical adenopathy.  Skin:    General: Skin is warm and dry.     Coloration: Skin is not jaundiced or pale.     Findings: No bruising, erythema, lesion or rash.     Comments: No signs of local reaction of L upper arm  but area is mildly tender    Neurological:     Mental Status: He is alert.     Cranial Nerves: No cranial nerve deficit.     Motor: No weakness.     Coordination: Coordination normal.     Deep Tendon Reflexes: Reflexes are normal and symmetric. Reflexes normal.  Psychiatric:        Attention and Perception: Attention normal.        Mood and Affect: Mood is anxious.     Comments: Mildly anxoius in light of malaise  Pleasant            Assessment & Plan:   Problem List  Items Addressed This Visit       Other   Vaccine reaction - Primary    Pt has had a systemic vaccine reaction to prevnar 20 (no allergic symptoms / no anaphylaxis)  Started with arm pain- progressed to L sided body and head pain  No fever  Nausea but no vomiting and is hydrated  Reassuring exam and vitals today  Disc tx of symptoms until they improve- will try ibuprofen (with food)  Px flexeril for muscle ache and HA - try at night  Ice pack for head prn  ER precautions reviewed in detail  Will alert pcp as well  Prevnar 20 added to intolerance list   Reviewed chart today No hx of reaction to prevnar 13 in past  Generally does ok with vaccines   Update if not starting to improve in a week or if worsening

## 2022-08-19 NOTE — Progress Notes (Signed)
I don't think so.  It sounds like a bad vaccine reaction.

## 2022-08-24 ENCOUNTER — Other Ambulatory Visit: Payer: Self-pay | Admitting: Family Medicine

## 2022-08-24 MED ORDER — FLUOCINONIDE 0.05 % EX CREA: TOPICAL_CREAM | CUTANEOUS | 3 refills | Status: AC

## 2022-08-24 NOTE — Telephone Encounter (Signed)
Last office visit 08/18/2022 with Dr. Milinda Antis for adverse reaction of vaccine.  Last refilled 07/30/2021 for 60 g with no refills.  Next Appt: CPE 08/16/2023.

## 2022-08-24 NOTE — Telephone Encounter (Signed)
Prescription Request  08/24/2022  LOV: 08/13/2022  What is the name of the medication or equipment?  fluocinonide cream (LIDEX) 0.05 %   Have you contacted your pharmacy to request a refill? Yes   Which pharmacy would you like this sent to?   [id number] [place], [place] - [address] AT [facility] OF [address] [place] [address] Phone: [phone] Fax: [phone]     Patient notified that their request is being sent to the clinical staff for review and that they should receive a response within 2 business days.   Please advise at Mobile [phone] (mobile)

## 2022-09-25 ENCOUNTER — Ambulatory Visit
Admission: RE | Admit: 2022-09-25 | Discharge: 2022-09-25 | Disposition: A | Discharge status: Home / Self Care | Payer: Medicare PPO | Source: Ambulatory Visit | Attending: Pulmonary Disease | Admitting: Pulmonary Disease

## 2022-09-25 DIAGNOSIS — R911 Solitary pulmonary nodule: Secondary | ICD-10-CM | POA: Diagnosis not present

## 2022-09-25 DIAGNOSIS — R918 Other nonspecific abnormal finding of lung field: Secondary | ICD-10-CM | POA: Diagnosis not present

## 2022-09-30 ENCOUNTER — Other Ambulatory Visit: Payer: Self-pay

## 2022-09-30 DIAGNOSIS — R911 Solitary pulmonary nodule: Secondary | ICD-10-CM

## 2022-10-06 ENCOUNTER — Telehealth: Payer: Self-pay | Admitting: Family Medicine

## 2022-10-06 NOTE — Telephone Encounter (Signed)
Patient contacted the office regarding recent CT scan results, patient states he was found to have a kidney stone. Patient would like to know if Dr. Patsy Lager would recommend for him to see a urologist? Patient says he sees a provider at [facility] once a year, also says he is not experiencing ay pain or issues going along with this. States he would like to know Dr. Cyndie Chime thoughts on the matter as he values his opinion very highly. Please advise, thank you.

## 2022-10-06 NOTE — Telephone Encounter (Signed)
Mr. Muddiman notified as instructed by telephone.  Patient states understanding and appreciates Dr. Cyndie Chime input.

## 2022-10-26 ENCOUNTER — Ambulatory Visit: Payer: Medicare PPO | Admitting: Pulmonary Disease

## 2022-10-27 ENCOUNTER — Telehealth: Payer: Self-pay | Admitting: Family Medicine

## 2022-10-27 NOTE — Telephone Encounter (Signed)
Next Colonoscopy due 12/21/2024. Patient notified of this via telephone.

## 2022-10-27 NOTE — Telephone Encounter (Signed)
Patient called in and was wondering when he should have his next colonoscopy. Please advise. Thank you!

## 2022-11-23 ENCOUNTER — Encounter: Payer: Self-pay | Admitting: Primary Care

## 2022-11-23 ENCOUNTER — Ambulatory Visit: Payer: Medicare PPO | Admitting: Primary Care

## 2022-11-23 ENCOUNTER — Ambulatory Visit (INDEPENDENT_AMBULATORY_CARE_PROVIDER_SITE_OTHER)
Admission: RE | Admit: 2022-11-23 | Discharge: 2022-11-23 | Disposition: A | Discharge status: Home / Self Care | Payer: Medicare PPO | Source: Ambulatory Visit | Attending: Primary Care | Admitting: Primary Care

## 2022-11-23 VITALS — BP 108/72 | HR 95 | Temp 98.2°F | Ht 73.0 in | Wt 227.8 lb

## 2022-11-23 DIAGNOSIS — K5900 Constipation, unspecified: Secondary | ICD-10-CM

## 2022-11-23 DIAGNOSIS — R140 Abdominal distension (gaseous): Secondary | ICD-10-CM | POA: Diagnosis not present

## 2022-11-23 LAB — CBC WITH DIFFERENTIAL/PLATELET
Basophils Absolute: 0 10*3/uL (ref 0.0–0.1)
Basophils Relative: 0.4 % (ref 0.0–3.0)
Eosinophils Absolute: 0.1 10*3/uL (ref 0.0–0.7)
Eosinophils Relative: 2.1 % (ref 0.0–5.0)
HCT: 44.7 % (ref 39.0–52.0)
Hemoglobin: 15 g/dL (ref 13.0–17.0)
Lymphocytes Relative: 23.1 % (ref 12.0–46.0)
Lymphs Abs: 1.3 10*3/uL (ref 0.7–4.0)
MCHC: 33.5 g/dL (ref 30.0–36.0)
MCV: 94.7 fl (ref 78.0–100.0)
Monocytes Absolute: 0.5 10*3/uL (ref 0.1–1.0)
Monocytes Relative: 8.2 % (ref 3.0–12.0)
Neutro Abs: 3.6 10*3/uL (ref 1.4–7.7)
Neutrophils Relative %: 66.2 % (ref 43.0–77.0)
Platelets: 179 10*3/uL (ref 150.0–400.0)
RBC: 4.72 Mil/uL (ref 4.22–5.81)
RDW: 14.2 % (ref 11.5–15.5)
WBC: 5.5 10*3/uL (ref 4.0–10.5)

## 2022-11-23 LAB — TSH: TSH: 2.33 u[IU]/mL (ref 0.35–5.50)

## 2022-11-23 LAB — BASIC METABOLIC PANEL
BUN: 20 mg/dL (ref 6–23)
CO2: 26 mEq/L (ref 19–32)
Calcium: 9.8 mg/dL (ref 8.4–10.5)
Chloride: 106 mEq/L (ref 96–112)
Creatinine, Ser: 0.96 mg/dL (ref 0.40–1.50)
GFR: 82.59 mL/min (ref >60.00)
Glucose, Bld: 103 mg/dL — ABNORMAL HIGH (ref 70–99)
Potassium: 4.2 mEq/L (ref 3.5–5.1)
Sodium: 139 mEq/L (ref 135–145)

## 2022-11-23 NOTE — Assessment & Plan Note (Addendum)
Exam today without evidence of bowel obstruction or acute infection.  Checking labs today including CBC with differential, TSH and BMP. Checking abdominal plain films for further evaluation of constipation.  He does have a history of diverticulosis without diverticulitis.  Lower suspicion for this, but we will keep on differential list.  Start MiraLAX once to twice daily as needed.  Continue regular exercise, good water consumption, high-fiber diet. Await results.

## 2022-11-23 NOTE — Progress Notes (Signed)
Subjective:    Patient ID: Joshua Lang, male    DOB: 03-Mar-1956, 67 y.o.   MRN: [id number]  Constipation Associated symptoms include nausea. Pertinent negatives include no abdominal pain, fever or vomiting.    Joshua Lang is a very pleasant 67 y.o. male patient of Dr. Patsy Lager with a history of CAD, nephrolithiasis, BPH, hernia of abdominal cavity, diverticulosis who presents today to discuss constipation.  Occasional history of constipation. About three weeks ago he began to notice constipation with slower than usual movements. Currently, he's having one to two small bowel movements daily. He does have to strain on occasion to release his movements.   He's also noticed generalized abdominal bloating, especially when eating; and a non painful and non bleeding bump around the anus. He is passing gas.   He took one dose of Ducolax two days ago which was effective but caused bloating. He takes Probiotics and OTC magnesium. He's also tried a few GasX pills without improvement in bloating.   He exercises regularly, he drinks water throughout the day and evening, he endorses a healthy diet in general. He is under a lot of stress as he is taking care of his mother who lives in [place].   His last colonoscopy was completed in 2021 which revealed diverticulosis and nonbleeding internal hemorrhoids.  He is due for repeat colonoscopy in 2026.  He denies changes in medications.  He is not managed on GLP-1 agonist.   Review of Systems  Constitutional:  Negative for fever.  Gastrointestinal:  Positive for abdominal distention, constipation and nausea. Negative for abdominal pain and vomiting.         Past Medical History:  Diagnosis Date   Allergic rhinitis    Colonic polyp    Complication of anesthesia    WITH 1ST HERNIA SURGERY PT STATES HE WAS CHOKING WITH TUBE IN   ED (erectile dysfunction)    Enlarged prostate    Moderately   GERD (gastroesophageal reflux disease)    History of  kidney stones    H/O   Hypercholesteremia     Social History   Socioeconomic History   Marital status: Single    Spouse name: Not on file   Number of children: Not on file   Years of education: Not on file   Highest education level: Not on file  Occupational History   Occupation: [employer], Emergency planning/management officer, head    Comment: Doctorate  Tobacco Use   Smoking status: Never    Passive exposure: Never   Smokeless tobacco: Never  Vaping Use   Vaping status: Never Used  Substance and Sexual Activity   Alcohol use: Yes    Comment: occassional beer   Drug use: No   Sexual activity: Not on file  Other Topics Concern   Not on file  Social History Narrative   ** Merged History Encounter **       Social Determinants of Health   Financial Resource Strain: Not on file  Food Insecurity: Not on file  Transportation Needs: Not on file  Physical Activity: Not on file  Stress: Not on file  Social Connections: Not on file  Intimate Partner Violence: Not on file    Past Surgical History:  Procedure Laterality Date   COLONOSCOPY  2015   HERNIA REPAIR Left 1990's   X2   INGUINAL HERNIA REPAIR Right 03/24/2016   Procedure: HERNIA REPAIR INGUINAL ADULT;  Surgeon: Earline Mayotte, MD;  Location: [facility];  Service: General;  Laterality: Right;   JOINT REPLACEMENT Bilateral    THR   SHOULDER ARTHROSCOPY WITH OPEN ROTATOR CUFF REPAIR Right 03/22/2018   Procedure: SHOULDER ARTHROSCOPY WITH OPEN ROTATOR CUFF REPAIR;  Surgeon: Christena Flake, MD;  Location: [facility];  Service: Orthopedics;  Laterality: Right;   SHOULDER OPEN ROTATOR CUFF REPAIR  2014   Hooten, partial RTC tear with probably SAD, possible DCE   SHOULDER SURGERY  2009   open, 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, (Jim Hooten)   TOTAL HIP ARTHROPLASTY  09/2009   (Hooten)   TOTAL HIP ARTHROPLASTY Left 05/20/2015   Procedure: TOTAL HIP ARTHROPLASTY;  Surgeon: Donato Heinz, MD;  Location: [facility];  Service: Orthopedics;  Laterality: Left;     Family History  Problem Relation Age of Onset   Prostate cancer Father    Migraines Sister     Allergies  Allergen Reactions   Prevnar 20 [Pneumococcal 20-Val Conj Vacc]     Achy and sick feeling    Terbinafine And Related     Trouble sleeping, depression & fatigue   Tramadol Itching   Antihistamines, Chlorpheniramine-Type Other (See Comments)    Urinary frequency/urgency.    Oxycodone Itching    And hyper feeling/mind races "Hyper feeling"     Current Outpatient Medications on File Prior to Visit  Medication Sig Dispense Refill   Ascorbic Acid (VITAMIN C WITH ROSE HIPS) 500 MG tablet Take 500 mg by mouth daily.     aspirin 81 MG chewable tablet Chew 81 mg by mouth at bedtime.      atorvastatin (LIPITOR) 40 MG tablet Take 1 tablet (40 mg total) by mouth daily. 90 tablet 3   B Complex-C (B-COMPLEX WITH VITAMIN C) tablet Take 1 tablet by mouth daily.     Cholecalciferol (VITAMIN D3) 50 MCG (2000 UT) TABS Take 1 tablet by mouth daily.     Cranberry 500 MG TABS Take 1 tablet by mouth in the morning and at bedtime.     esomeprazole (NEXIUM) 40 MG capsule TAKE 1 CAPSULE(40 MG) BY MOUTH DAILY 90 capsule 3   fluocinonide cream (LIDEX) 0.05 % Apply bid as directed 60 g 3   Magnesium Gluconate (MAG-G PO) Take by mouth.     Menaquinone-7 (K2 PO) Take 1 tablet by mouth daily.     Omega-3 1000 MG CAPS Take 1 capsule by mouth daily.     Probiotic Product (SUPER PROBIOTIC PO) Take 1 tablet by mouth daily.     Saw Palmetto 450 MG CAPS Take 450 mg by mouth 2 (two) times daily.      sildenafil (REVATIO) 20 MG tablet Take 2 to 5 tablets by mouth 30 minutes prior to intercourse. 90 tablet 3   tamsulosin (FLOMAX) 0.4 MG CAPS capsule Take 1 capsule (0.4 mg total) by mouth daily. 90 capsule 3   cyclobenzaprine (FLEXERIL) 10 MG tablet Take 0.5-1 tablets (5-10 mg total) by mouth 3 (three) times daily as needed for muscle spasms (aching, headache). (Patient not taking: Reported on 11/23/2022) 15  tablet 0   Multiple Vitamin (MULTIVITAMIN WITH MINERALS) TABS tablet Take 1 tablet by mouth 2 (two) times daily. (Patient not taking: Reported on 11/23/2022)     No current facility-administered medications on file prior to visit.    BP 108/72   Pulse 95   Temp 98.2 F (36.8 C) (Temporal)   Ht 6\' 1"  (1.854 m)   Wt 227 lb 12.8 oz (103.3 kg)   SpO2 97%   BMI 30.05 kg/m  Objective:   Physical Exam Constitutional:      General: He is not in acute distress. Cardiovascular:     Rate and Rhythm: Normal rate and regular rhythm.  Pulmonary:     Effort: Pulmonary effort is normal.  Abdominal:     General: Bowel sounds are normal.     Palpations: Abdomen is soft.     Tenderness: There is no abdominal tenderness.  Skin:    General: Skin is warm and dry.           Assessment & Plan:  Constipation, unspecified constipation type Assessment & Plan: Exam today without evidence of bowel obstruction or acute infection.  Checking labs today including CBC with differential, TSH and BMP. Checking abdominal plain films for further evaluation of constipation.  He does have a history of diverticulosis without diverticulitis.  Lower suspicion for this, but we will keep on differential list.  Start MiraLAX once to twice daily as needed.  Continue regular exercise, good water consumption, high-fiber diet. Await results.  Orders: -     DG Abd 2 Views -     Basic metabolic panel -     CBC with Differential/Platelet -     TSH        Doreene Nest, NP

## 2022-11-23 NOTE — Patient Instructions (Signed)
Start MiraLAX.  Mix 1 capful in 2 at least 8 ounces of water once or twice daily as needed.  Continue to drink water throughout the day.  Be sure to eat fiber rich foods.  Continue regular exercise.  Complete xray(s) and labs prior to leaving today. I will notify you of your results once received.  It was a pleasure to see you today!

## 2022-11-30 ENCOUNTER — Telehealth: Payer: Self-pay | Admitting: Family Medicine

## 2022-11-30 DIAGNOSIS — K5900 Constipation, unspecified: Secondary | ICD-10-CM

## 2022-11-30 NOTE — Telephone Encounter (Signed)
Noted. Recommend he increase Miralax to twice daily. Stop metamucil for now.  Make sure to be drinking 64 oz of water daily.  I'm happy to place a referral to GI. Which location? [place] or [place]?

## 2022-11-30 NOTE — Telephone Encounter (Signed)
Patient was seen on 11/23/2022 for constipation. Patient called in today stating that he is still having issues with his bowels.He was prescribed miralax,that he has been taking everyday along with metamucil,also with a change of his diet.He would like to know if there is anything else that he can be prescribed or if Jae Dire thinks that he may need to see an specialist.

## 2022-12-01 NOTE — Addendum Note (Signed)
Addended by: Doreene Nest on: 12/01/2022 12:42 PM   Modules accepted: Orders

## 2022-12-01 NOTE — Telephone Encounter (Signed)
Noted, referral placed to GI.

## 2022-12-01 NOTE — Telephone Encounter (Signed)
Called patient and reviewed all information. Patient verbalized understanding.  Patient would like GI referral, he is open to both locations just wherever can get him in soonest, he prefers [place] if that doesn't matter. He would also prefer a male provider.  Will call if any further questions.

## 2022-12-15 ENCOUNTER — Ambulatory Visit: Payer: Medicare PPO | Admitting: Gastroenterology

## 2022-12-15 ENCOUNTER — Encounter: Payer: Self-pay | Admitting: Gastroenterology

## 2022-12-15 VITALS — BP 130/76 | HR 63 | Temp 98.3°F | Ht 73.0 in | Wt 224.0 lb

## 2022-12-15 DIAGNOSIS — K5909 Other constipation: Secondary | ICD-10-CM | POA: Diagnosis not present

## 2022-12-15 NOTE — Progress Notes (Signed)
Wyline Mood MD, MRCP(U.K) [address]  [address]  [place], [place] [address]  Main: [phone]  Fax: [phone]   Gastroenterology Consultation  Referring Provider:     Doreene Nest, NP Primary Care Physician:  Hannah Beat, MD Primary Gastroenterologist:  Dr. Wyline Mood  Reason for Consultation:   Constipation        HPI:   Joshua Lang is a 67 y.o. y/o male referred for constipation.  He has here to see me for constipation which began about 2 months back and has since then resolved on its own.  About 2 months back he noticed he was having irregular bowel movements very hard to pass had a bit of blood on the tissue paper and then he changed his diet and all of it resolved has taken MiraLAX completed a bottle.  Presently has no issues she just wanted to come and speak to me to see if anything else needs to be done no unintentional weight loss not due for colonoscopy no rectal bleeding presently. 09/29/2022 CT scan of the chest without contrast shows left upper lobe groundglass nodule moderate hiatal hernia. 11/23/2022 TSH normal, hemoglobin 15 g, CMP creatinine of 0.96 11/28/2022 x-ray abdomen shows mildly prominent stool no bowel obstruction. 12/22/2019 colonoscopy diverticulosis of the sigmoid colon no polyps noted. Past Medical History:  Diagnosis Date   Allergic rhinitis    Colonic polyp    Complication of anesthesia    WITH 1ST HERNIA SURGERY PT STATES HE WAS CHOKING WITH TUBE IN   ED (erectile dysfunction)    Enlarged prostate    Moderately   GERD (gastroesophageal reflux disease)    History of kidney stones    H/O   Hypercholesteremia     Past Surgical History:  Procedure Laterality Date   COLONOSCOPY  2015   HERNIA REPAIR Left 1990's   X2   INGUINAL HERNIA REPAIR Right 03/24/2016   Procedure: HERNIA REPAIR INGUINAL ADULT;  Surgeon: Earline Mayotte, MD;  Location: [facility];  Service: General;  Laterality: Right;   JOINT REPLACEMENT Bilateral     THR   SHOULDER ARTHROSCOPY WITH OPEN ROTATOR CUFF REPAIR Right 03/22/2018   Procedure: SHOULDER ARTHROSCOPY WITH OPEN ROTATOR CUFF REPAIR;  Surgeon: Christena Flake, MD;  Location: [facility];  Service: Orthopedics;  Laterality: Right;   SHOULDER OPEN ROTATOR CUFF REPAIR  2014   [place], partial RTC tear with probably SAD, possible DCE   SHOULDER SURGERY  2009   open, 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, (Jim [place])   TOTAL HIP ARTHROPLASTY  09/2009   ([place])   TOTAL HIP ARTHROPLASTY Left 05/20/2015   Procedure: TOTAL HIP ARTHROPLASTY;  Surgeon: Donato Heinz, MD;  Location: [facility];  Service: Orthopedics;  Laterality: Left;    Prior to Admission medications   Medication Sig Start Date End Date Taking? Authorizing Provider  Ascorbic Acid (VITAMIN C WITH ROSE HIPS) 500 MG tablet Take 500 mg by mouth daily.    [provider]  aspirin 81 MG chewable tablet Chew 81 mg by mouth at bedtime.     [provider]  atorvastatin (LIPITOR) 40 MG tablet Take 1 tablet (40 mg total) by mouth daily. 08/13/22 08/08/23  Copland, Karleen Hampshire, MD  B Complex-C (B-COMPLEX WITH VITAMIN C) tablet Take 1 tablet by mouth daily.    [provider]  Cholecalciferol (VITAMIN D3) 50 MCG (2000 UT) TABS Take 1 tablet by mouth daily.    [provider]  Cranberry 500 MG  TABS Take 1 tablet by mouth in the morning and at bedtime.    [provider]  cyclobenzaprine (FLEXERIL) 10 MG tablet Take 0.5-1 tablets (5-10 mg total) by mouth 3 (three) times daily as needed for muscle spasms (aching, headache). Patient not taking: Reported on 11/23/2022 08/18/22   Tower, Audrie Gallus, MD  esomeprazole (NEXIUM) 40 MG capsule TAKE 1 CAPSULE(40 MG) BY MOUTH DAILY 08/17/22   Copland, Karleen Hampshire, MD  fluocinonide cream (LIDEX) 0.05 % Apply bid as directed 08/24/22   Copland, Karleen Hampshire, MD  Magnesium Gluconate (MAG-G PO) Take by mouth.    [provider]  Menaquinone-7 (K2 PO) Take 1 tablet by mouth daily.    [provider]  Multiple Vitamin (MULTIVITAMIN WITH MINERALS) TABS tablet Take 1 tablet by mouth 2 (two) times daily. Patient not taking: Reported on 11/23/2022    [provider]  Omega-3 1000 MG CAPS Take 1 capsule by mouth daily.    [provider]  Probiotic Product (SUPER PROBIOTIC PO) Take 1 tablet by mouth daily.    [provider]  Saw Palmetto 450 MG CAPS Take 450 mg by mouth 2 (two) times daily.     [provider]  sildenafil (REVATIO) 20 MG tablet Take 2 to 5 tablets by mouth 30 minutes prior to intercourse. 08/13/22   Copland, Karleen Hampshire, MD  tamsulosin (FLOMAX) 0.4 MG CAPS capsule Take 1 capsule (0.4 mg total) by mouth daily. 08/13/22   Hannah Beat, MD    Family History  Problem Relation Age of Onset   Prostate cancer Father    Migraines Sister      Social History   Tobacco Use   Smoking status: Never    Passive exposure: Never   Smokeless tobacco: Never  Vaping Use   Vaping status: Never Used  Substance Use Topics   Alcohol use: Yes    Comment: occassional beer   Drug use: No    Allergies as of 12/15/2022 - Review Complete 12/15/2022  Allergen Reaction Noted   Prevnar 20 [pneumococcal 20-val conj vacc]  08/18/2022   Terbinafine and related  12/09/2016   Tramadol Itching 07/10/2015   Antihistamines, chlorpheniramine-type Other (See Comments) 01/22/2014   Oxycodone Itching 01/22/2014    Review of Systems:    All systems reviewed and negative except where noted in HPI.   Physical Exam:  BP 130/76   Pulse 63   Temp 98.3 F (36.8 C) (Oral)   Ht 6\' 1"  (1.854 m)   Wt 224 lb (101.6 kg)   BMI 29.55 kg/m  No LMP for male patient. Psych:  Alert and cooperative. Normal mood and affect. General:   Alert,  Well-developed, well-nourished, pleasant and cooperative in NAD Head:  Normocephalic and atraumatic. Eyes:  Sclera clear, no icterus.   Conjunctiva pink. Ears:  Normal auditory acuity. Neck:  Supple; no masses or  thyromegaly..    Neurologic:  Alert and oriented x3;  grossly normal neurologically. Psych:  Alert and cooperative. Normal mood and affect.  Imaging Studies: DG Abd 2 Views  Result Date: 11/28/2022 CLINICAL DATA:  Abdominal distention and constipation. EXAM: ABDOMEN - 2 VIEW COMPARISON:  Normal bowel-gas pattern with scratch abdomen and pelvis CT dated 09/04/2020 FINDINGS: Normal bowel-gas pattern. Mildly prominent stool. Lumbar and lower thoracic spine degenerative changes and mild dextroconvex rotary scoliosis. Bilateral hip prostheses. Hernia repair mesh anchors overlying the left inguinal region. IMPRESSION: Mildly prominent stool. No bowel obstruction. Electronically Signed   By: Zada Finders.D.  On: 11/28/2022 18:01    Assessment and Plan:   Joshua Lang is a 67 y.o. y/o male has been referred for constipation.  Last colonoscopy in 2021 was normal.  Labs and within normal range.  Some change in his bowel habits but has resolved at this point of time.  I explained to him that 2 options just to watch and wait very closely if his symptoms recur then we can go ahead with urgent colonoscopy otherwise and still willing to do a colonoscopy due to some change in bowel habits although the pretest probability may be low to find anything such as cancer.  Plan 1.  High-fiber diet 2.  He will go home review of symptoms for the next 2 to 3 weeks and decide if he would like to proceed with colonoscopy or just watch and wait.    Follow up in as needed  Dr Wyline Mood MD,MRCP(U.K)

## 2022-12-15 NOTE — Patient Instructions (Signed)
Constipation, Adult Constipation is when a person has trouble pooping (having a bowel movement). When you have this condition, you may poop fewer than 3 times a week. Your poop (stool) may also be dry, hard, or bigger than normal. Follow these instructions at home: Eating and drinking  Eat foods that have a lot of fiber, such as: Fresh fruits and vegetables. Whole grains. Beans. Eat less of foods that are low in fiber and high in fat and sugar, such as: [place] fries. Hamburgers. Cookies. Candy. Soda. Drink enough fluid to keep your pee (urine) pale yellow. General instructions Exercise regularly or as told by your doctor. Try to do 150 minutes of exercise each week. Go to the restroom when you feel like you need to poop. Do not hold it in. Take over-the-counter and prescription medicines only as told by your doctor. These include any fiber supplements. When you poop: Do deep breathing while relaxing your lower belly (abdomen). Relax your pelvic floor. The pelvic floor is a group of muscles that support the rectum, bladder, and intestines (as well as the uterus in women). Watch your condition for any changes. Tell your doctor if you notice any. Keep all follow-up visits as told by your doctor. This is important. Contact a doctor if: You have pain that gets worse. You have a fever. You have not pooped for 4 days. You vomit. You are not hungry. You lose weight. You are bleeding from the opening of the butt (anus). You have thin, pencil-like poop. Get help right away if: You have a fever, and your symptoms suddenly get worse. You leak poop or have blood in your poop. Your belly feels hard or bigger than normal (bloated). You have very bad belly pain. You feel dizzy or you faint. Summary Constipation is when a person poops fewer than 3 times a week, has trouble pooping, or has poop that is dry, hard, or bigger than normal. Eat foods that have a lot of fiber. Drink enough fluid  to keep your pee (urine) pale yellow. Take over-the-counter and prescription medicines only as told by your doctor. These include any fiber supplements. This information is not intended to replace advice given to you by your health care provider. Make sure you discuss any questions you have with your health care provider. Document Revised: 03/11/2022 Document Reviewed: 03/11/2022 Elsevier Patient Education  2024 Elsevier Inc. High-Fiber Eating Plan Fiber, also called dietary fiber, is found in foods such as fruits, vegetables, whole grains, and beans. A high-fiber diet can be good for your health. Your health care provider may recommend a high-fiber diet to help: Prevent trouble pooping (constipation). Lower your cholesterol. Treat the following conditions: Hemorrhoids. This is inflammation of veins in the anus. Inflammation of specific areas of the digestive tract. Irritable bowel syndrome (IBS). This is a problem of the large intestine, also called the colon, that sometimes causes belly pain and bloating. Prevent overeating as part of a weight-loss plan. Lower the risk of heart disease, type 2 diabetes, and certain cancers. What are tips for following this plan? Reading food labels  Check the nutrition facts label on foods for the amount of dietary fiber. Choose foods that have 4 grams of fiber or more per serving. The recommended goals for how much fiber you should eat each day include: Males 38 years old or younger: 30-34 g. Males over 5 years old: 28-34 g. Females 72 years old or younger: 25-28 g. Females over 86 years old: 22-25 g. Your daily  fiber goal is _____________ g. Shopping Choose whole fruits and vegetables instead of processed. For example, choose apples instead of apple juice or applesauce. Choose a variety of high-fiber foods such as avocados, lentils, oats, and pinto beans. Read the nutrition facts label on foods. Check for foods with added fiber. These foods often  have high sugar and salt (sodium) amounts per serving. Cooking Use whole-grain flour for baking and cooking. Cook with brown rice instead of white rice. Make meals that have a lot of beans and vegetables in them, such as chili or vegetable-based soups. Meal planning Start the day with a breakfast that is high in fiber, such as a cereal that has 5 g of fiber or more per serving. Eat breads and cereals that are made with whole-grain flour instead of refined flour or white flour. Eat brown rice, bulgur wheat, or millet instead of white rice. Use beans in place of meat in soups, salads, and pasta dishes. Be sure that half of the grains you eat each day are whole grains. General information You can get the recommended amount of dietary fiber by: Eating a variety of fruits, vegetables, grains, nuts, and beans. Taking a fiber supplement if you aren't able to eat enough fiber. It's better to get fiber through food than from a supplement. Slowly increase how much fiber you eat. If you increase the amount of fiber you eat too quickly, you may have bloating, cramping, or gas. Drink plenty of water to help you digest fiber. Choose high-fiber snacks, such as berries, raw vegetables, nuts, and popcorn. What foods should I eat? Fruits Berries. Pears. Apples. Oranges. Avocado. Prunes and raisins. Dried figs. Vegetables Sweet potatoes. Spinach. Kale. Artichokes. Cabbage. Broccoli. Cauliflower. Green peas. Carrots. Squash. Grains Whole-grain breads. Multigrain cereal. Oats and oatmeal. Brown rice. Barley. Bulgur wheat. Millet. Quinoa. Bran muffins. Popcorn. Rye wafer crackers. Meats and other proteins Navy beans, kidney beans, and pinto beans. Soybeans. Split peas. Lentils. Nuts and seeds. Dairy Fiber-fortified yogurt. Fortified means that fiber has been added to the product. Beverages Fiber-fortified soy milk. Fiber-fortified orange juice. Other foods Fiber bars. The items listed above may not be all  the foods and drinks you can have. Talk to a dietitian to learn more. What foods should I avoid? Fruits Fruit juice. Cooked, strained fruit. Vegetables Fried potatoes. Canned vegetables. Well-cooked vegetables. Grains White bread. Pasta made with refined flour. White rice. Meats and other proteins Fatty meat. Fried chicken or fried fish. Dairy Milk. Cream cheese. Sour cream. Fats and oils Butters. Beverages Soft drinks. Other foods Cakes and pastries. The items listed above may not be all the foods and drinks you should avoid. Talk to a dietitian to learn more. This information is not intended to replace advice given to you by your health care provider. Make sure you discuss any questions you have with your health care provider. Document Revised: 07/20/2022 Document Reviewed: 07/20/2022 Elsevier Patient Education  2024 [employer].

## 2022-12-29 ENCOUNTER — Telehealth: Payer: Self-pay | Admitting: Gastroenterology

## 2022-12-29 NOTE — Telephone Encounter (Signed)
Patient called in and wanted to let Dr. Tobi Bastos know that he is very grateful for helping him with his problem. He has a question about some medicine that Dr. Tobi Bastos advised him to stop taking.

## 2022-12-30 ENCOUNTER — Encounter: Payer: Self-pay | Admitting: Gastroenterology

## 2022-12-30 NOTE — Telephone Encounter (Signed)
Patient called and left me a voicemail letting me know that I could reply back to his question via MyChart. I will send his MyChart message to Dr. Tobi Bastos to reply.

## 2023-01-22 IMAGING — DX DG CHEST 2V
2 series · 2 of 2 positions shown · non-contrast
Comparison: Overlapping portions CT abdomen 09/04/2020

CLINICAL DATA: cough, yellow mucus, and chest congestion x 1 week.
He had a tele-visit 1 week ago and a few days from that visit he was
prescribed doxycycline. Pt states he is not better and has some
"rattling" in his chest.

EXAM:
CHEST - 2 VIEW

[chest pa]
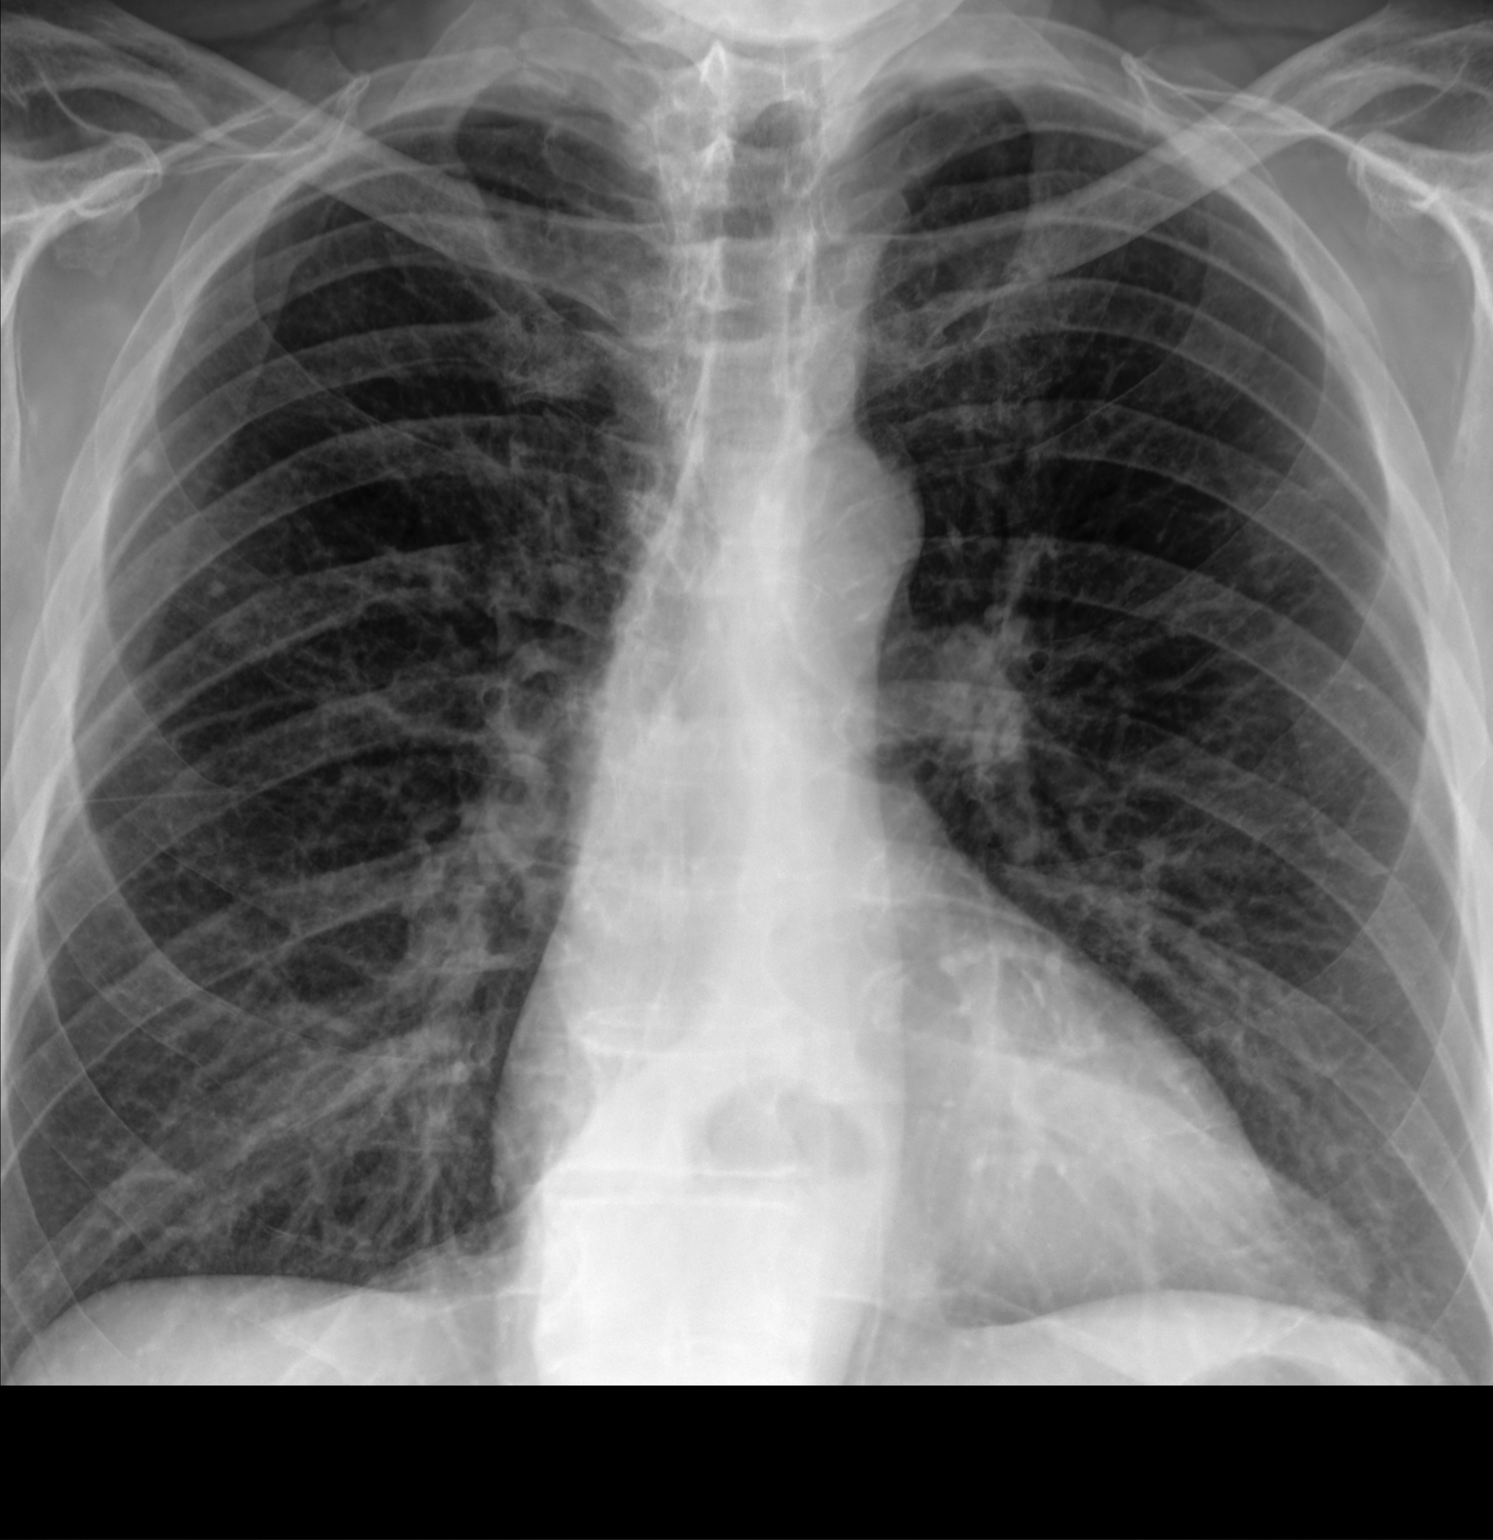

[chest lat]
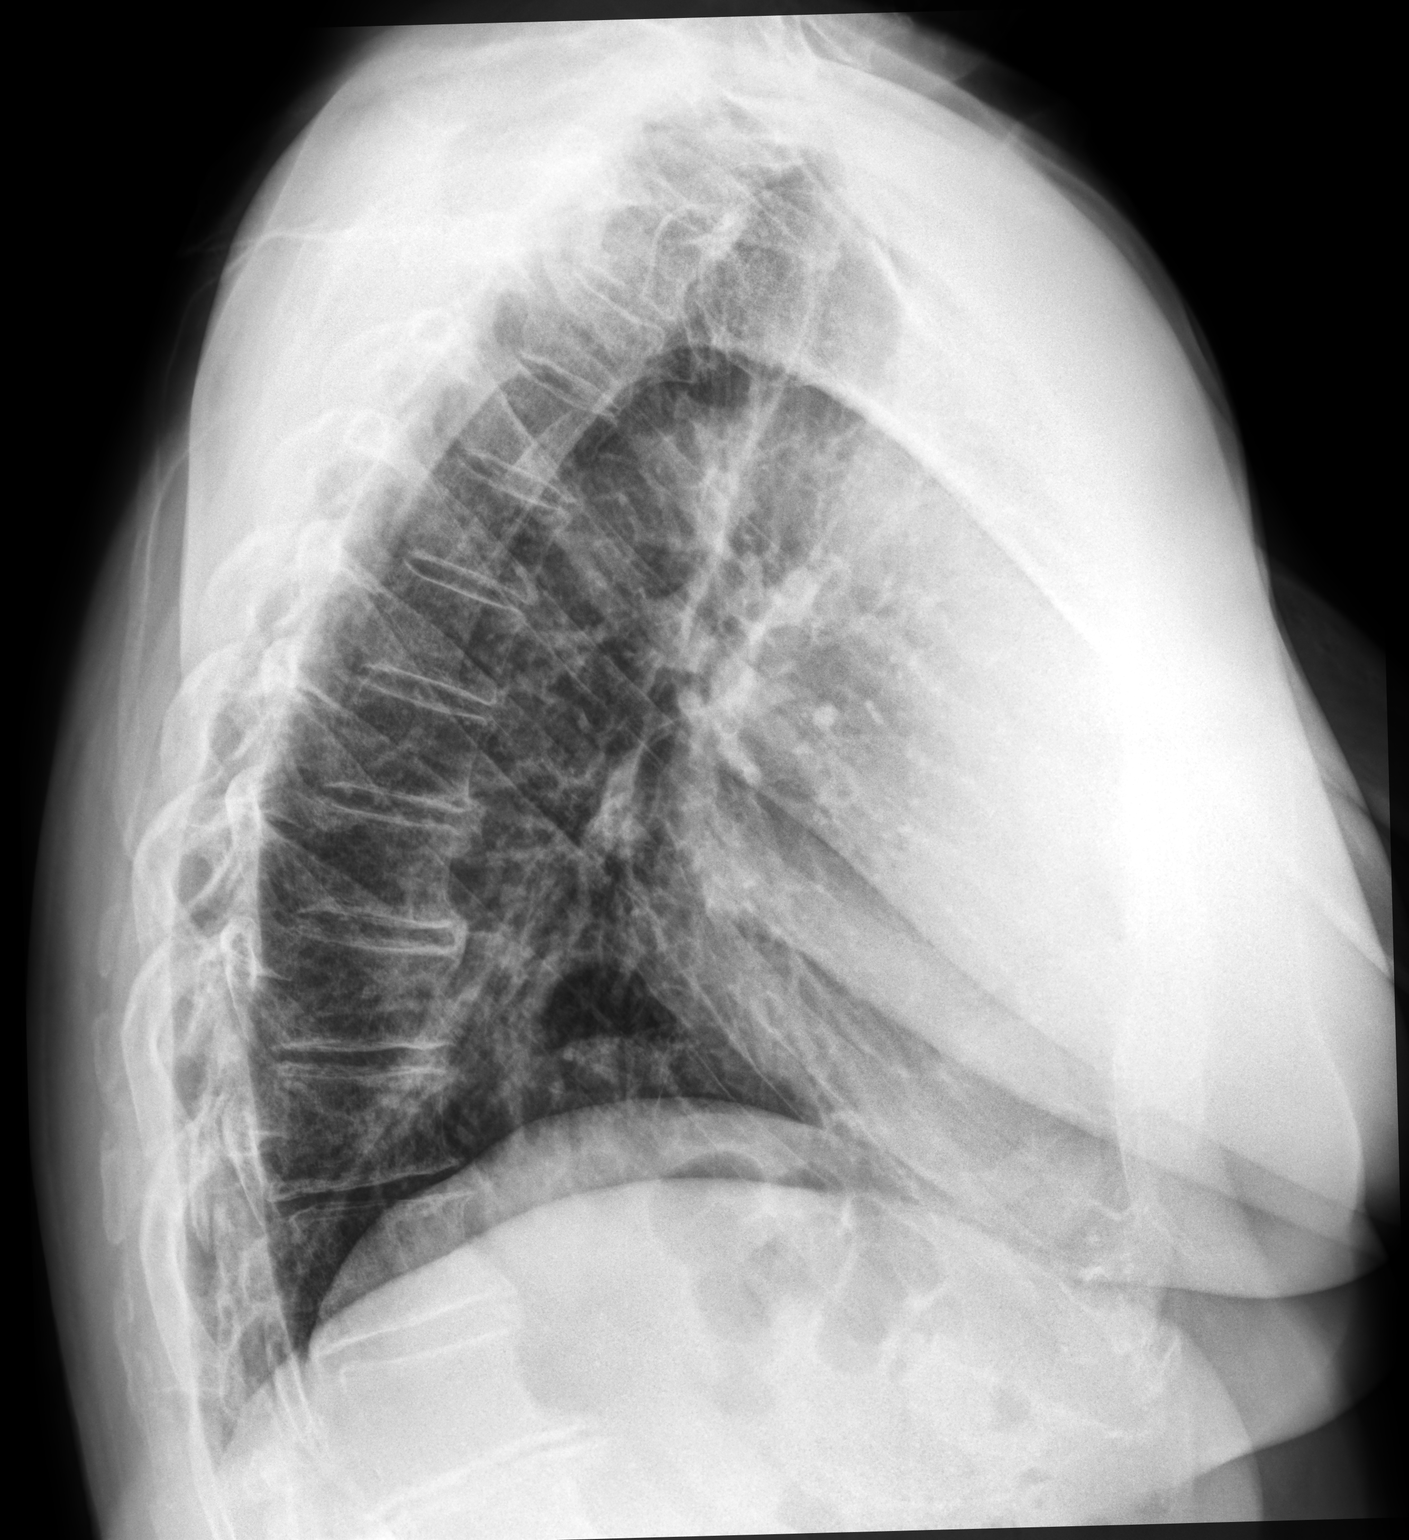

[2 of 2 positions shown; findings below may reference images not displayed]

FINDINGS: The lateral costophrenic angles are inadvertently omitted. No
blunting of the posterior costophrenic angles.

Moderate-sized hiatal hernia. Dense 5 mm and separate 3 mm nodules
project over the right upper lobe, probably calcified granulomas but
technically nonspecific. Heart size within normal limits. No
airspace opacity identified. No significant interstitial
accentuation noted.
IMPRESSION: 1. No findings of pneumonia.
2. 2 small nodules are present in the right upper lobe, the larger 5
mm in diameter. These may well be calcified granulomas better
technically nonspecific. Consider follow up CT chest for further
characterization.
3. Moderate-sized hiatal hernia.

## 2023-01-28 ENCOUNTER — Ambulatory Visit: Payer: Medicare PPO | Admitting: Gastroenterology

## 2023-02-18 ENCOUNTER — Ambulatory Visit: Payer: Medicare PPO | Admitting: Urology

## 2023-02-18 ENCOUNTER — Encounter: Payer: Self-pay | Admitting: Urology

## 2023-02-18 VITALS — BP 131/76 | HR 62 | Ht 73.0 in | Wt 221.0 lb

## 2023-02-18 DIAGNOSIS — Z87440 Personal history of urinary (tract) infections: Secondary | ICD-10-CM | POA: Diagnosis not present

## 2023-02-18 DIAGNOSIS — N400 Enlarged prostate without lower urinary tract symptoms: Secondary | ICD-10-CM | POA: Diagnosis not present

## 2023-02-18 DIAGNOSIS — N390 Urinary tract infection, site not specified: Secondary | ICD-10-CM

## 2023-02-18 DIAGNOSIS — N529 Male erectile dysfunction, unspecified: Secondary | ICD-10-CM

## 2023-02-18 LAB — BLADDER SCAN AMB NON-IMAGING

## 2023-02-18 MED ORDER — SILDENAFIL CITRATE 100 MG PO TABS: 100.0000 mg | ORAL_TABLET | Freq: Every day | ORAL | 6 refills | Status: DC | PRN

## 2023-02-18 NOTE — Progress Notes (Signed)
   02/18/2023 12:52 PM   Joshua Lang 24-Oct-1955 [id number]  Reason for visit: Follow up PSA screening, history of recurrent prostatitis, BPH, ED   HPI: Healthy 67 year old male with a family history of prostate cancer in his father who has had 7-8 episodes of culture documented prostatitis over the last 10 to 15 years.  Many of these were associated with stress.  We previously offered further evaluation with cystoscopy and CT urogram, but he deferred.  He was amenable to starting cranberry tablet prophylaxis, and has not had any infections since that time and is very pleased with his urinary symptoms.  He denies any infections over the last year.  He continues on Flomax which he feels is helpful.  PVR today is normal at 5 mL.   PSA has been stable, most recently 0.67 from March 2024.  I reviewed numerous prior PSA values, and PSA really has been stable around 0.7-0.9 over the last 10+ years.  We reviewed the AUA guidelines that recommend screening up to age 65.   He has ED that is responsive to sildenafil 100 mg on demand.  I refilled this medication.     -Recommended continuing cranberry tablet for prostatitis/UTI prevention -Sildenafil refilled -Okay to continue Flomax -RTC 1 year with PSA prior, PVR  Sondra Come, MD  [facility] [address] [place], [place] [address] [phone]

## 2023-03-17 IMAGING — CT CT CHEST W/O CM
1 of 2 series · 14 of 30 positions shown, 18 images · non-contrast
Comparison: Two-view chest x-ray 03/14/2021

CLINICAL DATA: Follow-up lung nodules.

EXAM:
CT CHEST WITHOUT CONTRAST
TECHNIQUE: Multidetector CT imaging of the chest was performed following the
standard protocol without IV contrast.

[Series 2: chest w/(date) · axial · 0.85mm/px · z∈[-348,-40]mm · 14 of 180 slices shown, 18 images]
[im 13/180  mediastinal]
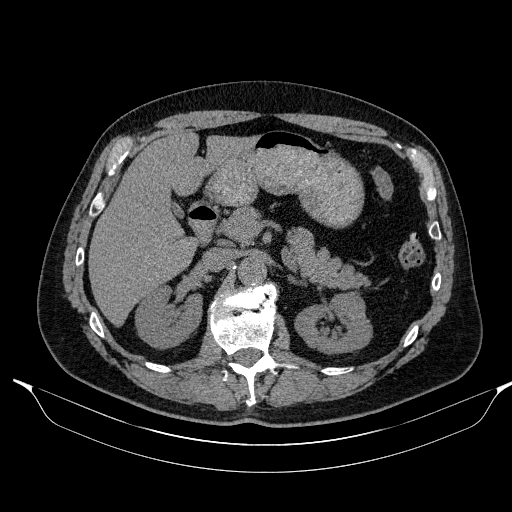
[im 13/180  lung]
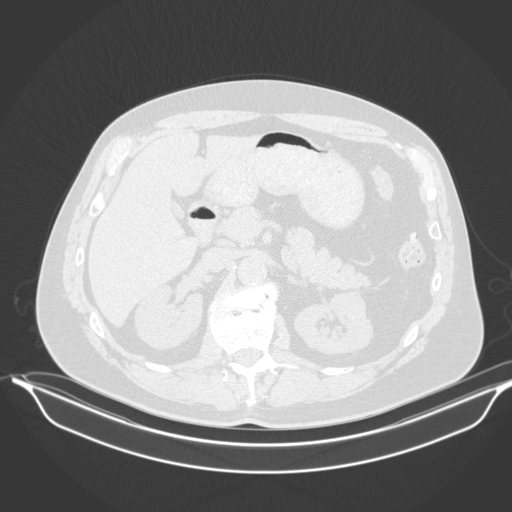
[im 26/180  lung]
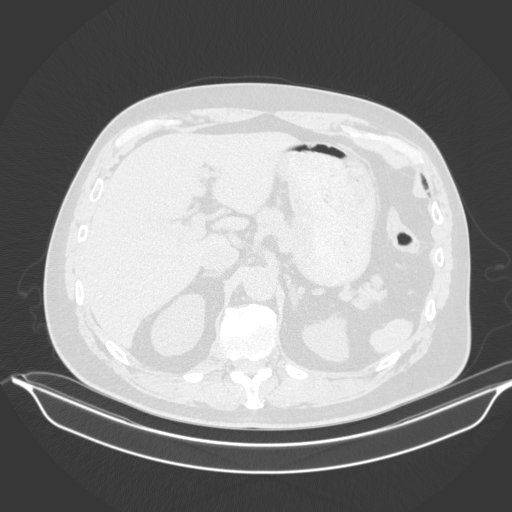
[im 39/180  lung]
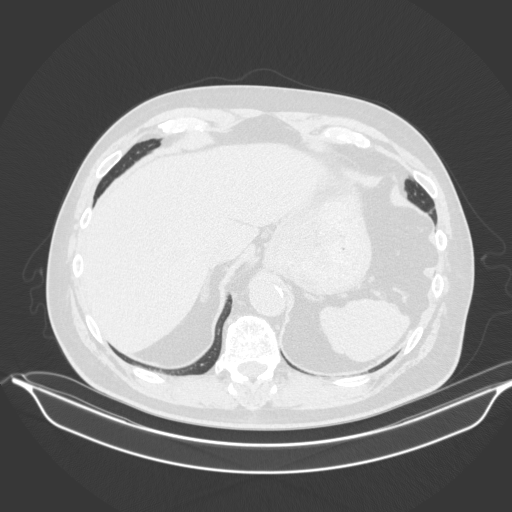
[im 52/180  lung]
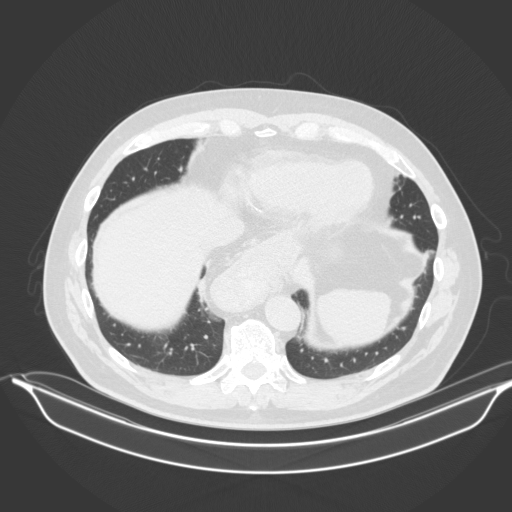
[im 64/180  mediastinal]
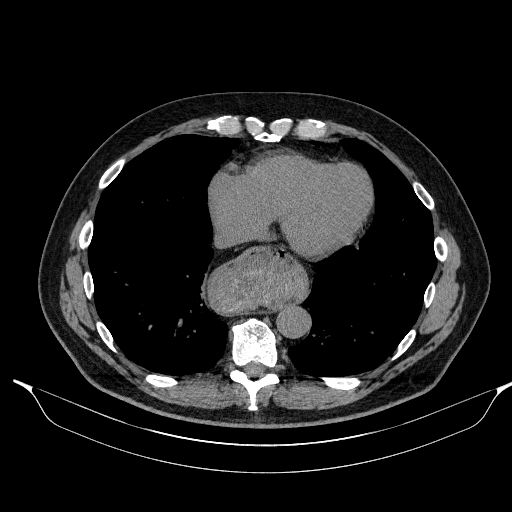
[im 64/180  lung]
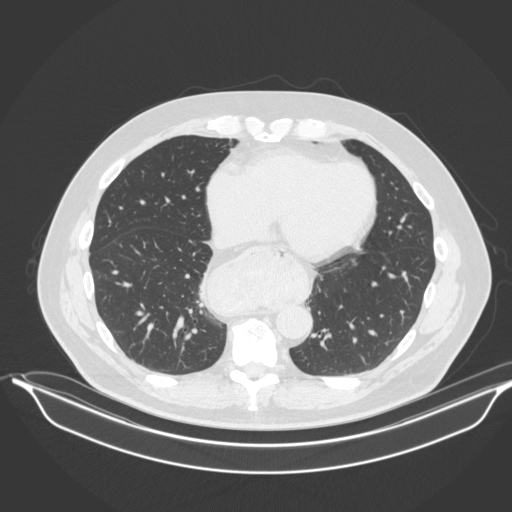
[im 77/180  lung]
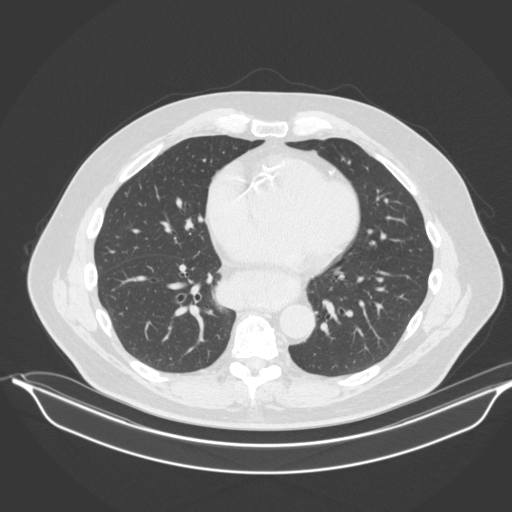
[im 86/180  lung]
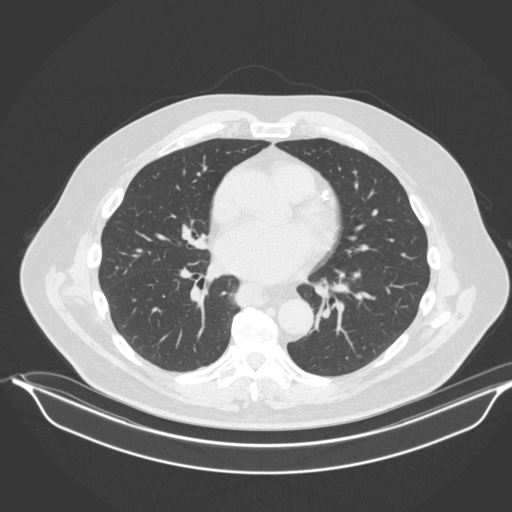
[im 90/180  lung]
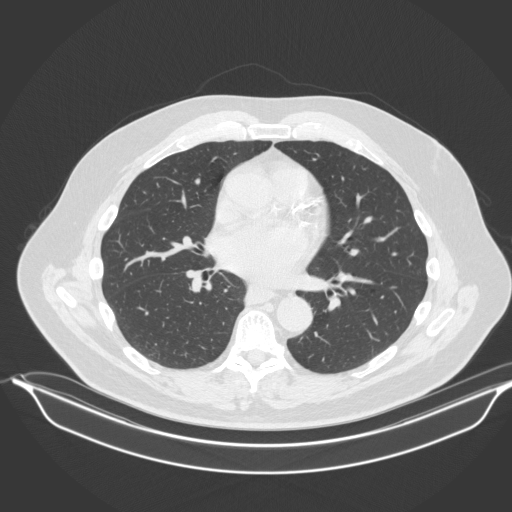
[im 103/180  mediastinal]
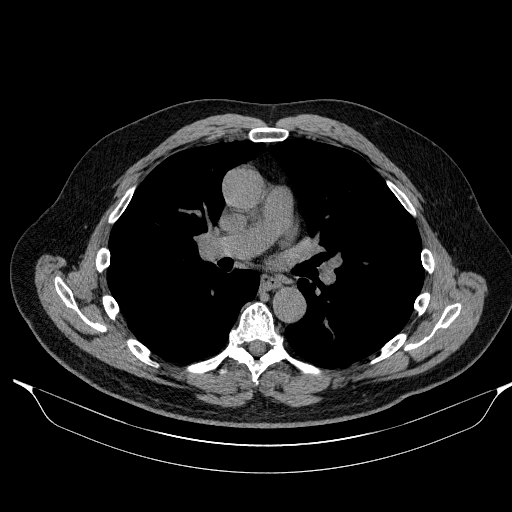
[im 103/180  lung]
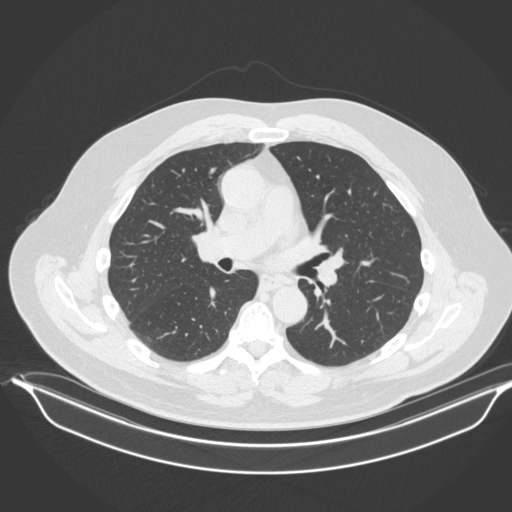
[im 116/180  lung]
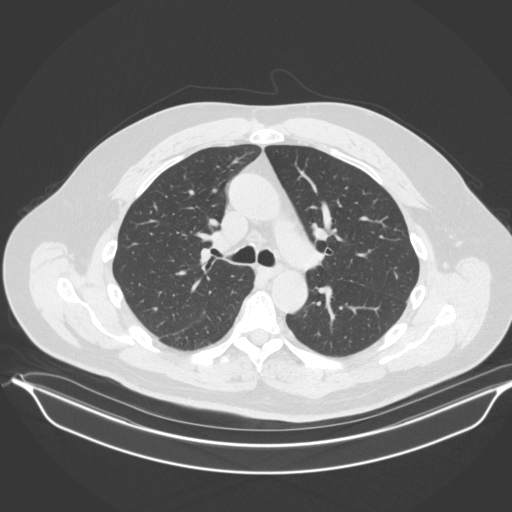
[im 128/180  lung]
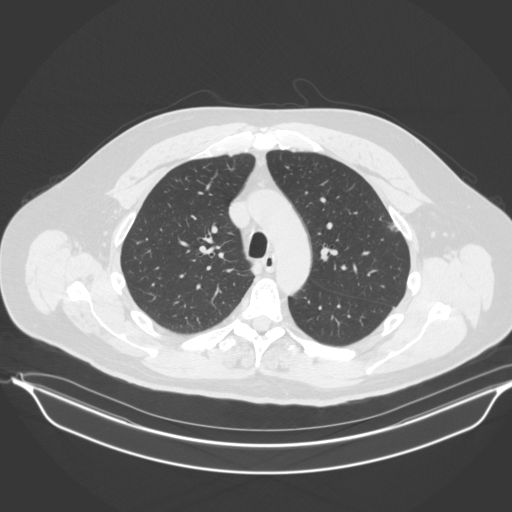
[im 141/180  lung]
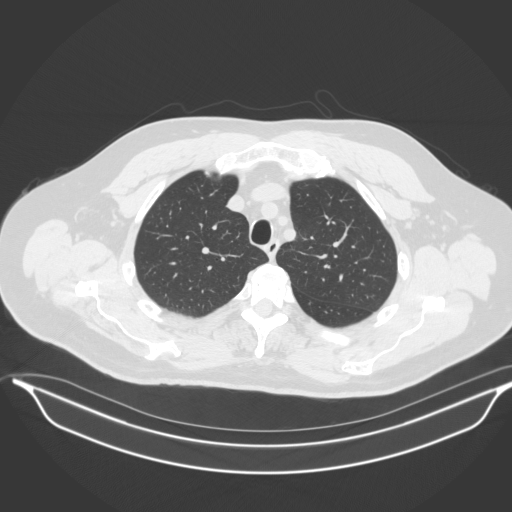
[im 154/180  mediastinal]
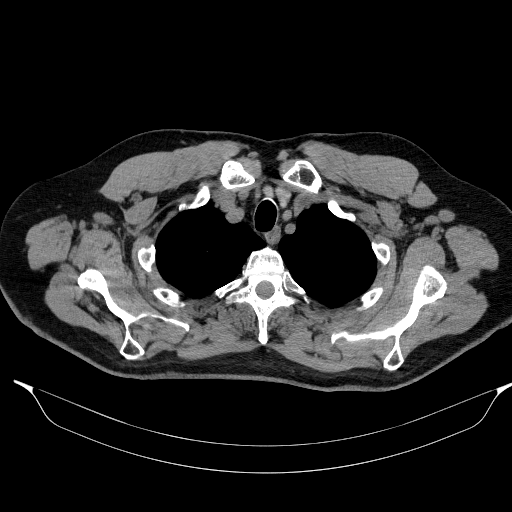
[im 154/180  lung]
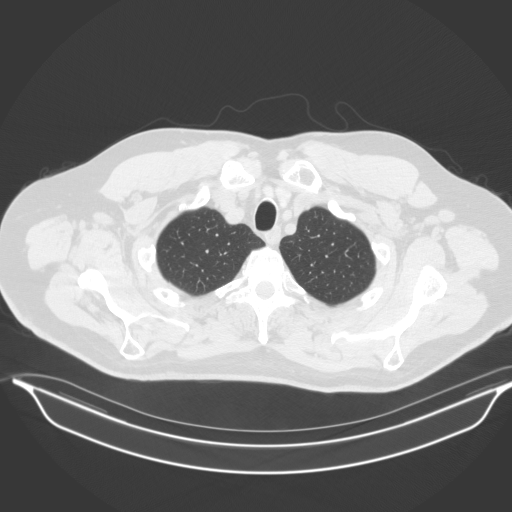
[im 167/180  lung]
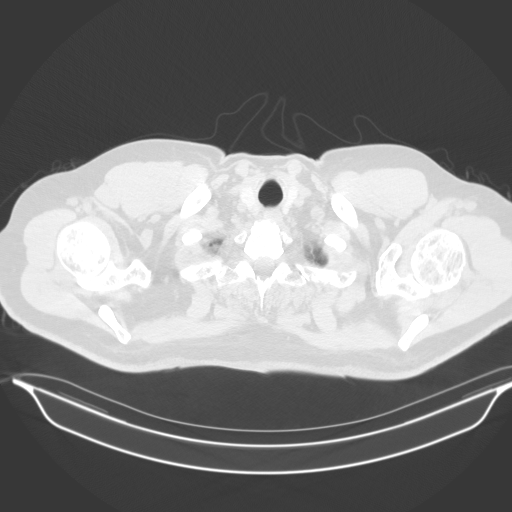

[14 of 30 positions shown; findings below may reference images not displayed]

FINDINGS: Cardiovascular: The heart size is normal. Coronary artery
calcifications are present. Atherosclerotic calcifications are
present along the undersurface of the aortic arch without aneurysm.
Pulmonary artery size is normal.

Mediastinum/Nodes: No enlarged mediastinal or axillary lymph nodes.
Thyroid gland, trachea, and esophagus demonstrate no significant
findings.

Lungs/Pleura: 3 mm calcified nodule is noted on image 63 of series
5. The second density noted on the chest x-ray corresponds with a
bone island present in the right third rib. No additional nodule is
present. No other solid nodule is present. A 7 mm ground-glass
nodule is present peripherally in the left upper lobe on image 54 of
series 5. Other nodule mass lesion, or airspace disease is present.
No significant pleural disease is present.

Upper Abdomen: Moderate-sized hiatal hernia is present. A punctate
nonobstructing stone is present at the upper pole of the left
kidney. Visualized upper abdomen is otherwise unremarkable.

Musculoskeletal: Multilevel degenerative changes are present in the
thoracic spine. Hemangioma is present at T4. No other focal osseous
lesions are present. No additional rib lesions present. No chest
wall lesion is present.
IMPRESSION: 1. 3 mm high-density granuloma in the right upper lobe. No follow-up
necessary.
2. The second density noted on the chest x-ray corresponds with a
bone island in the right third rib. No additional rib lesions are
present.
3. 7 mm ground-glass nodule peripherally in the left upper lobe.
Follow-up non-contrast CT recommended at 3-6 months to confirm
persistence. If unchanged, and solid component remains <6 mm, annual
CT is recommended until 5 years of stability has been established.
If persistent these nodules should be considered highly suspicious
if the solid component of the nodule is 6 mm or greater in size and
enlarging. This recommendation follows the consensus statement:
Guidelines for Management of Incidental Pulmonary Nodules Detected
[DATE].
4. Coronary artery disease.
5. Moderate-sized hiatal hernia.
6. Punctate nonobstructing stone at the upper pole of the left
kidney.
7. Aortic Atherosclerosis ([id number]-3X8.8).

## 2023-05-25 ENCOUNTER — Encounter: Payer: Self-pay | Admitting: Cardiology

## 2023-05-25 ENCOUNTER — Ambulatory Visit: Payer: Medicare PPO | Attending: Cardiology | Admitting: Cardiology

## 2023-05-25 VITALS — BP 116/84 | HR 68 | Ht 73.0 in | Wt 227.4 lb

## 2023-05-25 DIAGNOSIS — I2510 Atherosclerotic heart disease of native coronary artery without angina pectoris: Secondary | ICD-10-CM

## 2023-05-25 DIAGNOSIS — E7800 Pure hypercholesterolemia, unspecified: Secondary | ICD-10-CM | POA: Diagnosis not present

## 2023-05-25 NOTE — Patient Instructions (Signed)
 Medication Instructions:   Your physician recommends that you continue on your current medications as directed. Please refer to the Current Medication list given to you today.  *If you need a refill on your cardiac medications before your next appointment, please call your pharmacy*   Lab Work:  None Ordered  If you have labs (blood work) drawn today and your tests are completely normal, you will receive your results only by: MyChart Message (if you have MyChart) OR A paper copy in the mail If you have any lab test that is abnormal or we need to change your treatment, we will call you to review the results.   Testing/Procedures:  None Ordered    Follow-Up: At [facility], you and your health needs are our priority.  As part of our continuing mission to provide you with exceptional heart care, we have created designated Provider Care Teams.  These Care Teams include your primary Cardiologist (physician) and Advanced Practice Providers (APPs -  Physician Assistants and Nurse Practitioners) who all work together to provide you with the care you need, when you need it.  We recommend signing up for the patient portal called "MyChart".  Sign up information is provided on this After Visit Summary.  MyChart is used to connect with patients for Virtual Visits (Telemedicine).  Patients are able to view lab/test results, encounter notes, upcoming appointments, etc.  Non-urgent messages can be sent to your provider as well.   To learn more about what you can do with MyChart, go to [email or web address].    Your next appointment:   12 month(s)  Provider:   You may see Debbe Odea, MD or one of the following Advanced Practice Providers on your designated Care Team:   Nicolasa Ducking, NP Eula Listen, PA-C Cadence Fransico Michael, PA-C Charlsie Quest, NP Carlos Levering, NP

## 2023-05-25 NOTE — Progress Notes (Signed)
 Cardiology Office Note:    Date:  05/25/2023   ID:  Joshua Lang, DOB 1955-08-30, MRN [id number] PCP:  Watt Mirza, MD   [facility] Providers Cardiologist:  Redell Cave, MD     Referring MD: Watt Mirza, MD   Chief Complaint  Patient presents with   Follow-up    Patient denies new or acute cardiac problems/concerns today.      History of Present Illness:    Joshua Lang is a 68 y.o. male with a hx of CAD (three-vessel calcification on chest CT), hyperlipidemia who presents for follow-up.    Doing okay, denies chest pain or shortness of breath.  Compliant with aspirin, Lipitor as prescribed.  Engages in physical activity/exercise with no adverse effects or chest pains.  Doing well,   Prior notes Echo 06/2021 EF 60 to 65%.  coronary artery calcifications were noted on noncontrast chest CT dated 04/2021.  He denies chest pain or shortness of breath.  Denies any history of heart disease or family history of MI.    Past Medical History:  Diagnosis Date   Allergic rhinitis    Colonic polyp    Complication of anesthesia    WITH 1ST HERNIA SURGERY PT STATES HE WAS CHOKING WITH TUBE IN   ED (erectile dysfunction)    Enlarged prostate    Moderately   GERD (gastroesophageal reflux disease)    History of kidney stones    H/O   Hypercholesteremia     Past Surgical History:  Procedure Laterality Date   COLONOSCOPY  2015   HERNIA REPAIR Left 1990's   X2   INGUINAL HERNIA REPAIR Right 03/24/2016   Procedure: HERNIA REPAIR INGUINAL ADULT;  Surgeon: Reyes LELON Cota, MD;  Location: [facility] ORS;  Service: General;  Laterality: Right;   JOINT REPLACEMENT Bilateral    THR   SHOULDER ARTHROSCOPY WITH OPEN ROTATOR CUFF REPAIR Right 03/22/2018   Procedure: SHOULDER ARTHROSCOPY WITH OPEN ROTATOR CUFF REPAIR;  Surgeon: Edie Norleen PARAS, MD;  Location: [facility] ORS;  Service: Orthopedics;  Laterality: Right;   SHOULDER OPEN ROTATOR CUFF REPAIR  2014   Hooten, partial RTC tear  with probably SAD, possible DCE   SHOULDER SURGERY  2009   open, 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, (Jim Hooten)   TOTAL HIP ARTHROPLASTY  09/2009   (Hooten)   TOTAL HIP ARTHROPLASTY Left 05/20/2015   Procedure: TOTAL HIP ARTHROPLASTY;  Surgeon: Joshua SHAUNNA Hue, MD;  Location: [facility] ORS;  Service: Orthopedics;  Laterality: Left;    Current Medications: Current Meds  Medication Sig   Ascorbic Acid (VITAMIN C  WITH ROSE HIPS) 500 MG tablet Take 500 mg by mouth daily.   aspirin 81 MG chewable tablet Chew 81 mg by mouth at bedtime.    atorvastatin  (LIPITOR) 40 MG tablet Take 1 tablet (40 mg total) by mouth daily.   B Complex-C (B-COMPLEX WITH VITAMIN C ) tablet Take 1 tablet by mouth daily.   Cholecalciferol (VITAMIN D3) 50 MCG (2000 UT) TABS Take 1 tablet by mouth daily.   Cranberry 500 MG TABS Take 1 tablet by mouth in the morning and at bedtime.   esomeprazole  (NEXIUM ) 40 MG capsule TAKE 1 CAPSULE(40 MG) BY MOUTH DAILY   fluocinonide  cream (LIDEX ) 0.05 % Apply bid as directed   Magnesium  Gluconate (MAG-G PO) Take by mouth.   Menaquinone-7 (K2 PO) Take 1 tablet by mouth daily.   Omega-3 1000 MG CAPS Take 1 capsule by mouth daily.   Saw Palmetto  450 MG CAPS Take 450 mg  by mouth 2 (two) times daily.    sildenafil  (VIAGRA ) 100 MG tablet Take 1 tablet (100 mg total) by mouth daily as needed for erectile dysfunction (take 30 minutes prior to sexual activity).   tamsulosin  (FLOMAX ) 0.4 MG CAPS capsule Take 1 capsule (0.4 mg total) by mouth daily.     Allergies:   Prevnar 20 [pneumococcal 20-val conj vacc]; Terbinafine  and related; Tramadol ; Antihistamines, chlorpheniramine-type; and Oxycodone   Social History   Socioeconomic History   Marital status: Single    Spouse name: Not on file   Number of children: Not on file   Years of education: Not on file   Highest education level: Not on file  Occupational History   Occupation: [employer], Emergency Planning/management Officer, head    Comment: Doctorate  Tobacco Use   Smoking  status: Never    Passive exposure: Never   Smokeless tobacco: Never  Vaping Use   Vaping status: Never Used  Substance and Sexual Activity   Alcohol use: Yes    Comment: occassional beer   Drug use: No   Sexual activity: Not on file  Other Topics Concern   Not on file  Social History Narrative   ** Merged History Encounter **       Social Drivers of Corporate Investment Banker Strain: Not on file  Food Insecurity: Not on file  Transportation Needs: Not on file  Physical Activity: Not on file  Stress: Not on file  Social Connections: Not on file     Family History: The patient's family history includes Migraines in his sister; Prostate cancer in his father.  ROS:   Please see the history of present illness.     All other systems reviewed and are negative.  EKGs/Labs/Other Studies Reviewed:    The following studies were reviewed today:   EKG Interpretation Date/Time:  Tuesday May 25 2023 08:57:23 EST Ventricular Rate:  68 PR Interval:  184 QRS Duration:  78 QT Interval:  400 QTC Calculation: 425 R Axis:   -21  Text Interpretation: Sinus rhythm with Premature atrial complexes Confirmed by Darliss Rogue ([id number]) on 05/25/2023 9:03:06 AM    Recent Labs: 07/27/2022: ALT 22 11/23/2022: BUN 20; Creatinine, Ser 0.96; Hemoglobin 15.0; Platelets 179.0; Potassium 4.2; Sodium 139; TSH 2.33  Recent Lipid Panel    Component Value Date/Time   CHOL 156 07/27/2022 1046   TRIG 53.0 07/27/2022 1046   HDL 60.40 07/27/2022 1046   CHOLHDL 3 07/27/2022 1046   VLDL 10.6 07/27/2022 1046   LDLCALC 85 07/27/2022 1046     Risk Assessment/Calculations:          Physical Exam:    VS:  BP 116/84 (BP Location: Left Arm, Patient Position: Sitting, Cuff Size: Normal)   Pulse 68   Ht 6' 1 (1.854 m)   Wt 227 lb 6.4 oz (103.1 kg)   SpO2 93%   BMI 30.00 kg/m     Wt Readings from Last 3 Encounters:  05/25/23 227 lb 6.4 oz (103.1 kg)  02/18/23 221 lb (100.2 kg)   12/15/22 224 lb (101.6 kg)     GEN:  Well nourished, well developed in no acute distress HEENT: Normal NECK: No JVD; No carotid bruits LYMPHATICS: No lymphadenopathy CARDIAC: RRR, no murmurs, rubs, gallops RESPIRATORY:  Clear to auscultation without rales, wheezing or rhonchi  ABDOMEN: Soft, non-tender, non-distended MUSCULOSKELETAL:  No edema; No deformity  SKIN: Warm and dry NEUROLOGIC:  Alert and oriented x 3 PSYCHIATRIC:  Normal affect  ASSESSMENT:    1. Coronary artery disease involving native coronary artery of native heart without angina pectoris   2. Pure hypercholesterolemia    PLAN:    In order of problems listed above:  CAD/three-vessel coronary artery calcification on noncontrast chest CT. denies chest pain.  Echo 2/23 EF 60 to 65%.continue aspirin 81 mg daily, Lipitor 40 mg daily. LDL goal less than 70.  Cholesterol controlled.  Continue Lipitor 40 mg daily.  Follow-up in 1 year.      Medication Adjustments/Labs and Tests Ordered: Current medicines are reviewed at length with the patient today.  Concerns regarding medicines are outlined above.  Orders Placed This Encounter  Procedures   EKG 12-Lead   No orders of the defined types were placed in this encounter.   Patient Instructions  Medication Instructions:   Your physician recommends that you continue on your current medications as directed. Please refer to the Current Medication list given to you today.  *If you need a refill on your cardiac medications before your next appointment, please call your pharmacy*   Lab Work:  None Ordered  If you have labs (blood work) drawn today and your tests are completely normal, you will receive your results only by: MyChart Message (if you have MyChart) OR A paper copy in the mail If you have any lab test that is abnormal or we need to change your treatment, we will call you to review the results.   Testing/Procedures:  None  Ordered    Follow-Up: At [facility], you and your health needs are our priority.  As part of our continuing mission to provide you with exceptional heart care, we have created designated Provider Care Teams.  These Care Teams include your primary Cardiologist (physician) and Advanced Practice Providers (APPs -  Physician Assistants and Nurse Practitioners) who all work together to provide you with the care you need, when you need it.  We recommend signing up for the patient portal called MyChart.  Sign up information is provided on this After Visit Summary.  MyChart is used to connect with patients for Virtual Visits (Telemedicine).  Patients are able to view lab/test results, encounter notes, upcoming appointments, etc.  Non-urgent messages can be sent to your provider as well.   To learn more about what you can do with MyChart, go to [email or web address].    Your next appointment:   12 month(s)  Provider:   You may see Redell Cave, MD or one of the following Advanced Practice Providers on your designated Care Team:   Lonni Meager, NP Bernardino Bring, PA-C Cadence Franchester, PA-C Tylene Lunch, NP Barnie Hila, NP'   Signed, Redell Cave, MD  05/25/2023 9:29 AM    [facility]

## 2023-06-25 ENCOUNTER — Ambulatory Visit (INDEPENDENT_AMBULATORY_CARE_PROVIDER_SITE_OTHER): Payer: Medicare PPO

## 2023-06-25 VITALS — Ht 73.0 in | Wt 220.0 lb

## 2023-06-25 DIAGNOSIS — Z0000 Encounter for general adult medical examination without abnormal findings: Secondary | ICD-10-CM

## 2023-06-25 NOTE — Patient Instructions (Signed)
Joshua Lang , Thank you for taking time to come for your Medicare Wellness Visit. I appreciate your ongoing commitment to your health goals. Please review the following plan we discussed and let me know if I can assist you in the future.   Referrals/Orders/Follow-Ups/Clinician Recommendations: none  This is a list of the screening recommended for you and due dates:  Health Maintenance  Topic Date Due   Flu Shot  12/10/2022   COVID-19 Vaccine (5 - 2024-25 season) 01/10/2023   Medicare Annual Wellness Visit  06/24/2024   Colon Cancer Screening  12/21/2024   DTaP/Tdap/Td vaccine (3 - Td or Tdap) 12/31/2025   Pneumonia Vaccine  Completed   Hepatitis C Screening  Completed   Zoster (Shingles) Vaccine  Completed   HPV Vaccine  Aged Out    Advanced directives: (Declined) Advance directive discussed with you today. Even though you declined this today, please call our office should you change your mind, and we can give you the proper paperwork for you to fill out.  Next Medicare Annual Wellness Visit scheduled for next year: Yes 06/28/2024 @ 3:40pm televisit

## 2023-06-25 NOTE — Progress Notes (Signed)
 Subjective:   Joshua Lang is a 68 y.o. male who presents for Medicare Annual/Subsequent preventive examination.  Visit Complete: Virtual I connected with  Joshua Lang on 06/25/23 by a audio enabled telemedicine application and verified that I am speaking with the correct person using two identifiers.  Patient Location: Home  Provider Location: Office/Clinic  I discussed the limitations of evaluation and management by telemedicine. The patient expressed understanding and agreed to proceed.  Vital Signs: Because this visit was a virtual/telehealth visit, some criteria may be missing or patient reported. Any vitals not documented were not able to be obtained and vitals that have been documented are patient reported.  Patient Medicare AWV questionnaire was completed by the patient on (not done); I have confirmed that all information answered by patient is correct and no changes since this date.  Cardiac Risk Factors include: advanced age (>93men, >49 women);dyslipidemia;male gender     Objective:    Today's Vitals   06/25/23 1516  Weight: 220 lb (99.8 kg)  Height: 6\' 1"  (1.854 m)   Body mass index is 29.03 kg/m.     06/25/2023    3:25 PM 03/22/2018    9:24 AM 03/16/2018   12:05 PM 05/20/2015    6:19 PM 04/29/2015    8:01 AM  Advanced Directives  Does Patient Have a Medical Advance Directive? No No No No No  Would patient like information on creating a medical advance directive?  No - Patient declined  No - patient declined information Yes - Educational materials given    Current Medications (verified) Outpatient Encounter Medications as of 06/25/2023  Medication Sig   Ascorbic Acid (VITAMIN C WITH ROSE HIPS) 500 MG tablet Take 500 mg by mouth daily.   aspirin 81 MG chewable tablet Chew 81 mg by mouth at bedtime.    atorvastatin (LIPITOR) 40 MG tablet Take 1 tablet (40 mg total) by mouth daily.   B Complex-C (B-COMPLEX WITH VITAMIN C) tablet Take 1 tablet by mouth daily.    Cholecalciferol (VITAMIN D3) 50 MCG (2000 UT) TABS Take 1 tablet by mouth daily.   Cranberry 500 MG TABS Take 1 tablet by mouth in the morning and at bedtime.   esomeprazole (NEXIUM) 40 MG capsule TAKE 1 CAPSULE(40 MG) BY MOUTH DAILY   fluocinonide cream (LIDEX) 0.05 % Apply bid as directed   Magnesium Gluconate (MAG-G PO) Take by mouth.   Menaquinone-7 (K2 PO) Take 1 tablet by mouth daily.   Multiple Vitamin (MULTIVITAMIN WITH MINERALS) TABS tablet Take 1 tablet by mouth 2 (two) times daily. (Patient not taking: Reported on 05/25/2023)   Omega-3 1000 MG CAPS Take 1 capsule by mouth daily.   Saw Palmetto 450 MG CAPS Take 450 mg by mouth 2 (two) times daily.    sildenafil (VIAGRA) 100 MG tablet Take 1 tablet (100 mg total) by mouth daily as needed for erectile dysfunction (take 30 minutes prior to sexual activity).   tamsulosin (FLOMAX) 0.4 MG CAPS capsule Take 1 capsule (0.4 mg total) by mouth daily.   No facility-administered encounter medications on file as of 06/25/2023.    Allergies (verified) Prevnar 20 [pneumococcal 20-val conj vacc]; Terbinafine and related; Tramadol; Antihistamines, chlorpheniramine-type; and Oxycodone   History: Past Medical History:  Diagnosis Date   Allergic rhinitis    Colonic polyp    Complication of anesthesia    WITH 1ST HERNIA SURGERY PT STATES HE WAS CHOKING WITH TUBE IN   ED (erectile dysfunction)    Enlarged  prostate    Moderately   GERD (gastroesophageal reflux disease)    History of kidney stones    H/O   Hypercholesteremia    Past Surgical History:  Procedure Laterality Date   COLONOSCOPY  2015   HERNIA REPAIR Left 1990's   X2   INGUINAL HERNIA REPAIR Right 03/24/2016   Procedure: HERNIA REPAIR INGUINAL ADULT;  Surgeon: Earline Mayotte, MD;  Location: [facility] ORS;  Service: General;  Laterality: Right;   JOINT REPLACEMENT Bilateral    THR   SHOULDER ARTHROSCOPY WITH OPEN ROTATOR CUFF REPAIR Right 03/22/2018   Procedure: SHOULDER  ARTHROSCOPY WITH OPEN ROTATOR CUFF REPAIR;  Surgeon: Christena Flake, MD;  Location: [facility] ORS;  Service: Orthopedics;  Laterality: Right;   SHOULDER OPEN ROTATOR CUFF REPAIR  2014   Hooten, partial RTC tear with probably SAD, possible DCE   SHOULDER SURGERY  2009   open, 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, (Jim Hooten)   TOTAL HIP ARTHROPLASTY  09/2009   (Hooten)   TOTAL HIP ARTHROPLASTY Left 05/20/2015   Procedure: TOTAL HIP ARTHROPLASTY;  Surgeon: Donato Heinz, MD;  Location: [facility] ORS;  Service: Orthopedics;  Laterality: Left;   Family History  Problem Relation Age of Onset   Prostate cancer Father    Migraines Sister    Social History   Socioeconomic History   Marital status: Single    Spouse name: Not on file   Number of children: Not on file   Years of education: Not on file   Highest education level: Not on file  Occupational History   Occupation: [employer], Emergency planning/management officer, head    Comment: Doctorate  Tobacco Use   Smoking status: Never    Passive exposure: Never   Smokeless tobacco: Never  Vaping Use   Vaping status: Never Used  Substance and Sexual Activity   Alcohol use: Yes    Comment: occassional beer   Drug use: No   Sexual activity: Not on file  Other Topics Concern   Not on file  Social History Narrative   ** Merged History Encounter **       Social Drivers of Health   Financial Resource Strain: Low Risk  (06/25/2023)   Overall Financial Resource Strain (CARDIA)    Difficulty of Paying Living Expenses: Not hard at all  Food Insecurity: No Food Insecurity (06/25/2023)   Hunger Vital Sign    Worried About Lang Out of Food in the Last Year: Never true    Ran Out of Food in the Last Year: Never true  Transportation Needs: No Transportation Needs (06/25/2023)   PRAPARE - Administrator, Civil Service (Medical): No    Lack of Transportation (Non-Medical): No  Physical Activity: Sufficiently Active (06/25/2023)   Exercise Vital Sign    Days of Exercise per Week: 5  days    Minutes of Exercise per Session: 60 min  Stress: No Stress Concern Present (06/25/2023)   [employer] of Occupational Health - Occupational Stress Questionnaire    Feeling of Stress : Not at all  Social Connections: Unknown (06/25/2023)   Social Connection and Isolation Panel [NHANES]    Frequency of Communication with Friends and Family: More than three times a week    Frequency of Social Gatherings with Friends and Family: More than three times a week    Attends Religious Services: Never    Database administrator or Organizations: No    Attends Banker Meetings: Never    Marital Status: Patient  declined    Tobacco Counseling Counseling given: Not Answered  Clinical Intake:  Pre-visit preparation completed: Yes  Pain : No/denies pain   BMI - recorded: 29.03 Nutritional Status: BMI 25 -29 Overweight Nutritional Risks: None Diabetes: No  How often do you need to have someone help you when you read instructions, pamphlets, or other written materials from your doctor or pharmacy?: 1 - Never  Interpreter Needed?: No  Comments: lives alone Information entered by :: B.Tylyn Stankovich,LPN   Activities of Daily Living    06/25/2023    3:25 PM  In your present state of health, do you have any difficulty performing the following activities:  Hearing? 0  Vision? 0  Difficulty concentrating or making decisions? 0  Walking or climbing stairs? 0  Dressing or bathing? 0  Doing errands, shopping? 0  Preparing Food and eating ? N  Using the Toilet? N  In the past six months, have you accidently leaked urine? N  Do you have problems with loss of bowel control? N  Managing your Medications? N  Managing your Finances? N  Housekeeping or managing your Housekeeping? N    Patient Care Team: Hannah Beat, MD as PCP - General Debbe Odea, MD as PCP - Cardiology (Cardiology) Hannah Beat, MD as Consulting Physician (Family Medicine) Lemar Livings, Merrily Pew, MD (General Surgery) Debbe Odea, MD as Consulting Physician (Cardiology)  Indicate any recent Medical Services you may have received from other than [facility] providers in the past year (date may be approximate).     Assessment:   This is a routine wellness examination for Raydell.  Hearing/Vision screen Hearing Screening - Comments:: Pt says his hearing is good Vision Screening - Comments:: Pt says his vision is good readers only [facility]   Goals Addressed               This Visit's Progress     Patient Stated (pt-stated)        I want to maintain my health and improve strength       Depression Screen    06/25/2023    3:22 PM 11/23/2022   10:34 AM 08/13/2022   10:47 AM 05/29/2022    2:17 PM 07/30/2021   11:26 AM 07/29/2020    9:09 AM 07/26/2019    9:19 AM  PHQ 2/9 Scores  PHQ - 2 Score 0 0 0 1 0 0 0    Fall Risk    06/25/2023    3:18 PM 11/23/2022   10:34 AM 08/13/2022   10:47 AM 05/29/2022    2:16 PM 07/13/2016    2:54 PM  Fall Risk   Falls in the past year? 0 0 0 0 No  Number falls in past yr: 0 0 0 0   Injury with Fall? 0 0 0 0   Risk for fall due to : No Fall Risks No Fall Risks No Fall Risks No Fall Risks   Follow up Education provided;Falls prevention discussed Falls prevention discussed Falls evaluation completed Falls evaluation completed     MEDICARE RISK AT HOME: Medicare Risk at Home Any stairs in or around the home?: No If so, are there any without handrails?: No Home free of loose throw rugs in walkways, pet beds, electrical cords, etc?: Yes Adequate lighting in your home to reduce risk of falls?: Yes Life alert?: No Use of a cane, walker or w/c?: No Grab bars in the bathroom?: No Shower chair or bench in shower?: Yes Elevated toilet seat or a  handicapped toilet?: No  TIMED UP AND GO:  Was the test performed?  No    Cognitive Function:        06/25/2023    3:27 PM  6CIT Screen  What Year? 0 points  What month? 0 points  What  time? 0 points  Count back from 20 0 points  Months in reverse 0 points  Repeat phrase 0 points  Total Score 0 points    Immunizations Immunization History  Administered Date(s) Administered   Fluad Quad(high Dose 65+) 02/02/2022   Influenza Whole 02/08/2009, 02/07/2012   Influenza, Seasonal, Injecte, Preservative Fre 01/08/2014   Influenza,inj,Quad PF,6+ Mos 01/09/2015, 01/01/2016, 01/14/2017, 02/03/2018, 01/10/2019, 04/23/2020, 01/21/2021   PFIZER(Purple Top)SARS-COV-2 Vaccination 07/27/2019, 08/17/2019, 04/26/2020   PNEUMOCOCCAL CONJUGATE-20 08/13/2022   Pfizer(Comirnaty)Fall Seasonal Vaccine 12 years and older 07/10/2022   Pneumococcal Conjugate-13 04/23/2015   Pneumococcal Polysaccharide-23 03/28/2016   Td 02/13/2009   Tdap 01/01/2016   Zoster Recombinant(Shingrix) 01/14/2017, 03/23/2017   Zoster, Live 06/22/2013    TDAP status: Up to date  Flu Vaccine status: Due, Education has been provided regarding the importance of this vaccine. Advised may receive this vaccine at local pharmacy or Health Dept. Aware to provide a copy of the vaccination record if obtained from local pharmacy or Health Dept. Verbalized acceptance and understanding.  Pneumococcal vaccine status: Up to date  Covid-19 vaccine status: Completed vaccines  Qualifies for Shingles Vaccine? Yes   Zostavax completed Yes   Shingrix Completed?: Yes  Screening Tests Health Maintenance  Topic Date Due   INFLUENZA VACCINE  12/10/2022   COVID-19 Vaccine (5 - 2024-25 season) 01/10/2023   Medicare Annual Wellness (AWV)  06/24/2024   Colonoscopy  12/21/2024   DTaP/Tdap/Td (3 - Td or Tdap) 12/31/2025   Pneumonia Vaccine 83+ Years old  Completed   Hepatitis C Screening  Completed   Zoster Vaccines- Shingrix  Completed   HPV VACCINES  Aged Out    Health Maintenance  Health Maintenance Due  Topic Date Due   INFLUENZA VACCINE  12/10/2022   COVID-19 Vaccine (5 - 2024-25 season) 01/10/2023    Colorectal  cancer screening: Type of screening: Colonoscopy. Completed 12/22/2019. Repeat every 5 years  Lung Cancer Screening: (Low Dose CT Chest recommended if Age 9-80 years, 20 pack-year currently smoking OR have quit w/in 15years.) does not qualify.   Lung Cancer Screening Referral: no  Additional Screening:  Hepatitis C Screening: does not qualify; Completed 07/10/2015  Vision Screening: Recommended annual ophthalmology exams for early detection of glaucoma and other disorders of the eye. Is the patient up to date with their annual eye exam?  Yes  Who is the provider or what is the name of the office in which the patient attends annual eye exams? Dr Santiago Bumpers If pt is not established with a provider, would they like to be referred to a provider to establish care? No .   Dental Screening: Recommended annual dental exams for proper oral hygiene  Diabetic Foot Exam: n/a`  Community Resource Referral / Chronic Care Management: CRR required this visit?  No   CCM required this visit?  No     Plan:     I have personally reviewed and noted the following in the patient's chart:   Medical and social history Use of alcohol, tobacco or illicit drugs  Current medications and supplements including opioid prescriptions. Patient is not currently taking opioid prescriptions. Functional ability and status Nutritional status Physical activity Advanced directives List of other physicians Hospitalizations, surgeries, and  ER visits in previous 12 months Vitals Screenings to include cognitive, depression, and falls Referrals and appointments  In addition, I have reviewed and discussed with patient certain preventive protocols, quality metrics, and best practice recommendations. A written personalized care plan for preventive services as well as general preventive health recommendations were provided to patient.    Sue Lush, LPN   1/61/0960   After Visit Summary: (MyChart) Due to this being  a telephonic visit, the after visit summary with patients personalized plan was offered to patient via MyChart   Nurse Notes: The patient states he is doing well and has no concerns or questions at this time.

## 2023-07-08 IMAGING — CT CT CHEST W/ CM
1 series · 15 of 34 positions shown, 19 images · IV contrast (agent unspecified)
Comparison: None.

CLINICAL DATA: Follow-up pulmonary nodules

EXAM:
CT CHEST WITH CONTRAST
TECHNIQUE: Multidetector CT imaging of the chest was performed during
intravenous contrast administration.

[Series 2: chest with 2mm st · axial · 0.76mm/px · z∈[-351,-53]mm · 15 of 175 slices shown, 19 images]
[im 13/175  mediastinal]
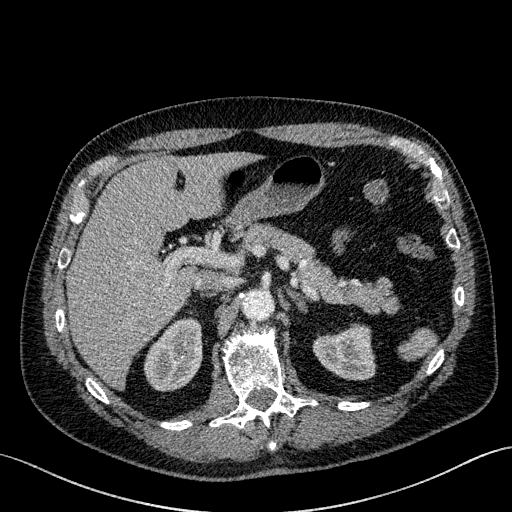
[im 13/175  lung]
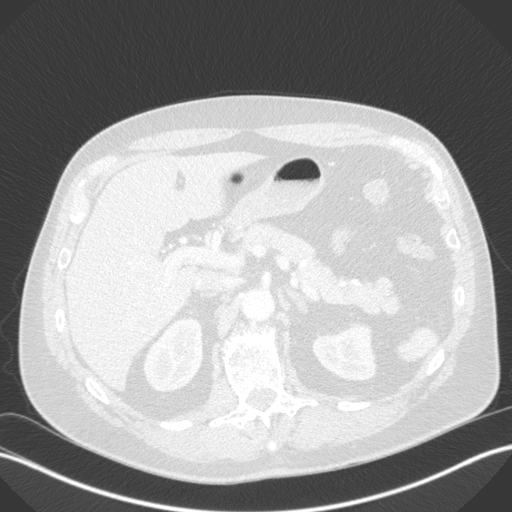
[im 26/175  lung]
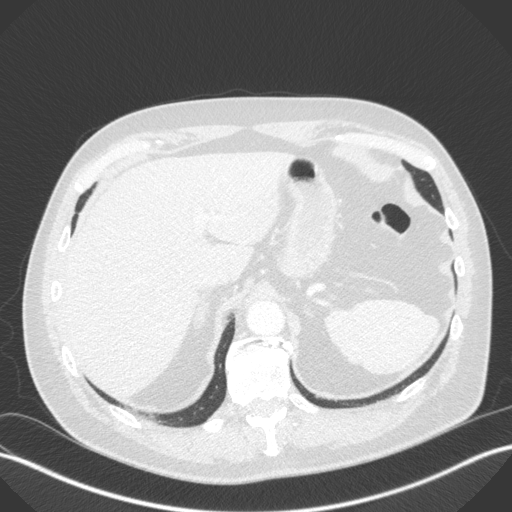
[im 35/175  lung]
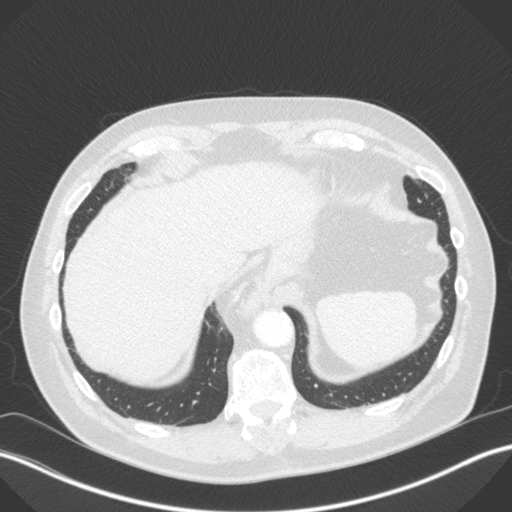
[im 46/175  lung]
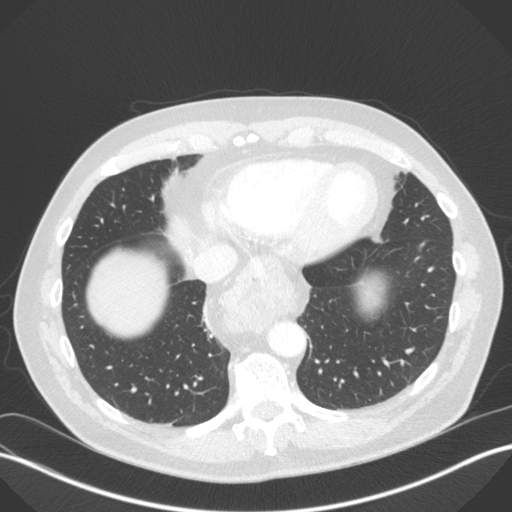
[im 59/175  mediastinal]
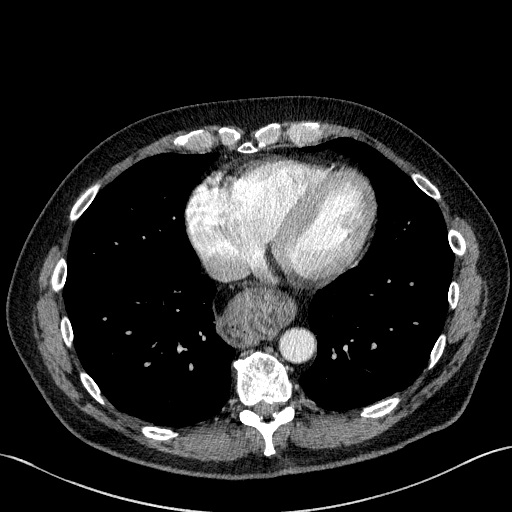
[im 59/175  lung]
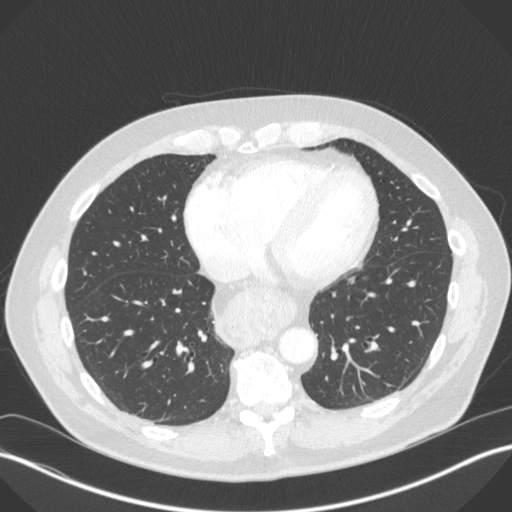
[im 70/175  lung]
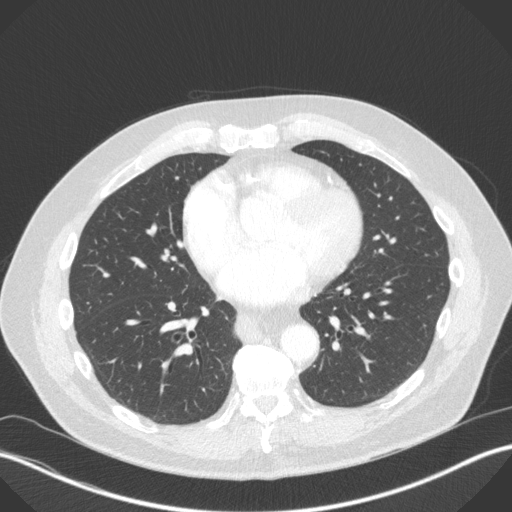
[im 78/175  lung]
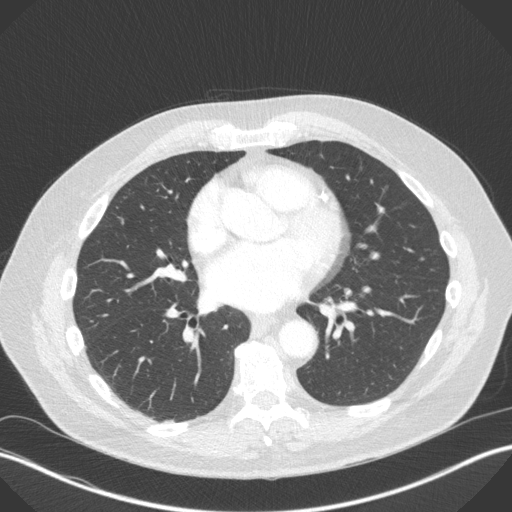
[im 91/175  lung]
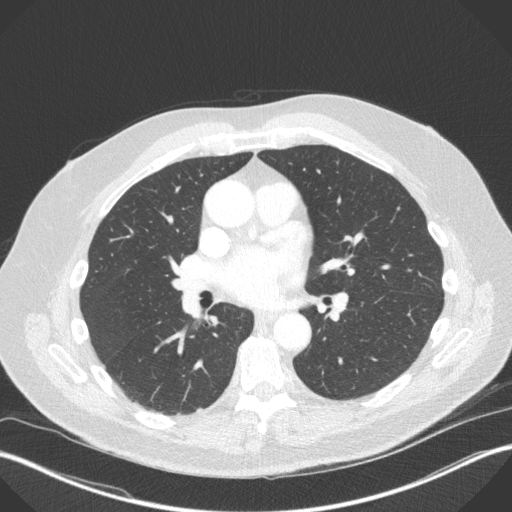
[im 97/175  mediastinal]
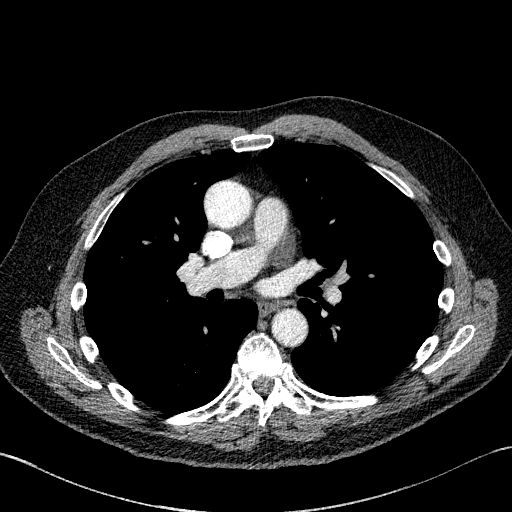
[im 97/175  lung]
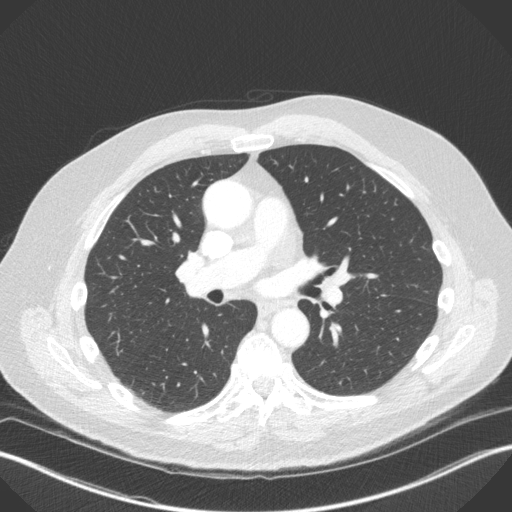
[im 105/175  lung]
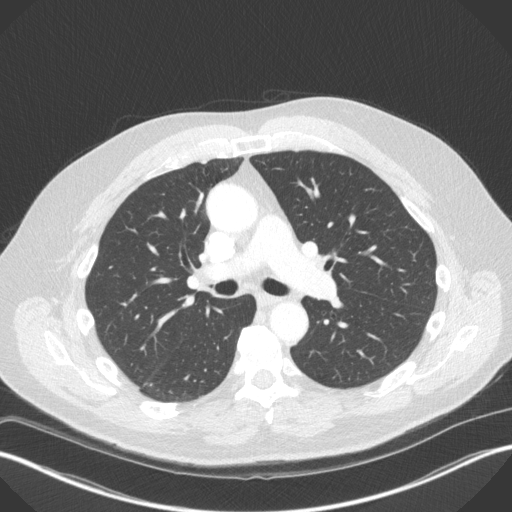
[im 117/175  lung]
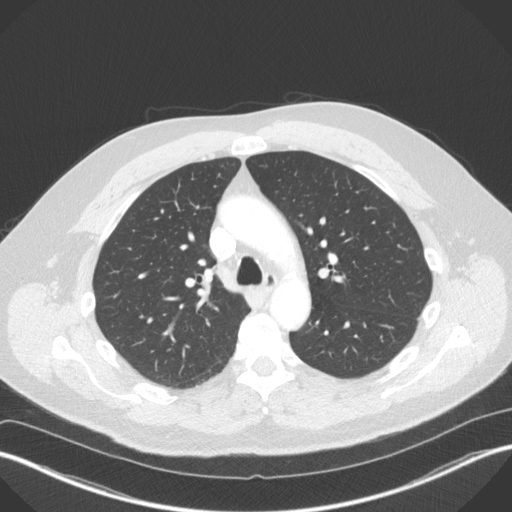
[im 129/175  lung]
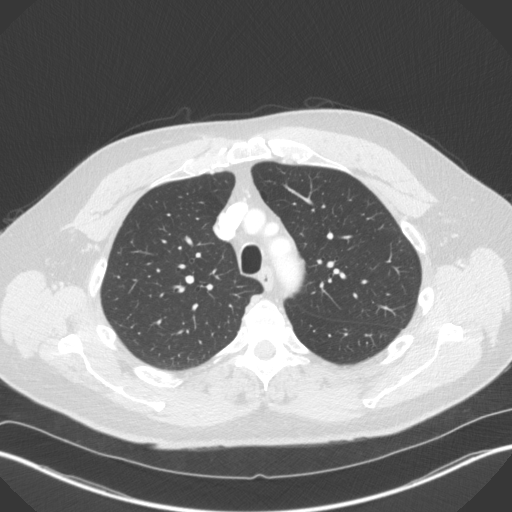
[im 140/175  mediastinal]
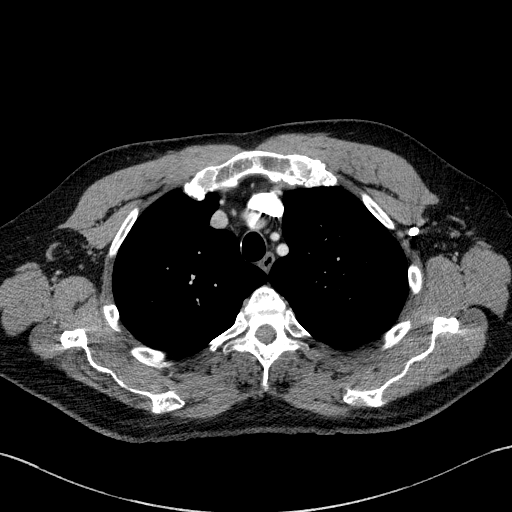
[im 140/175  lung]
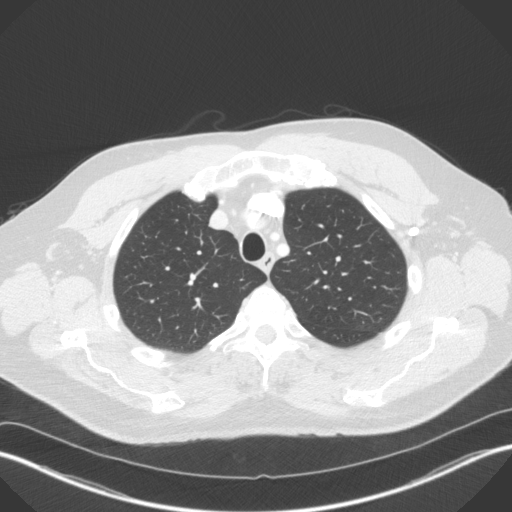
[im 149/175  lung]
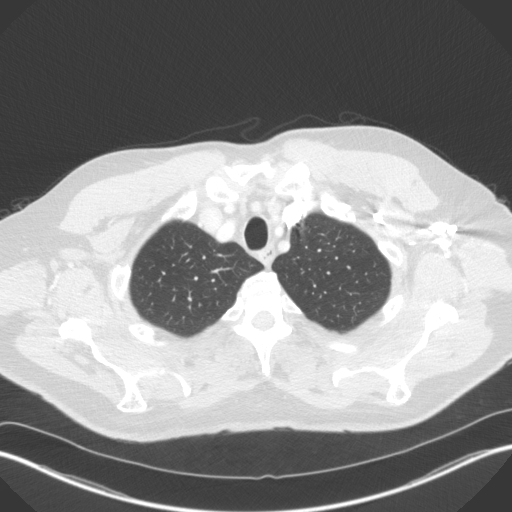
[im 162/175  lung]
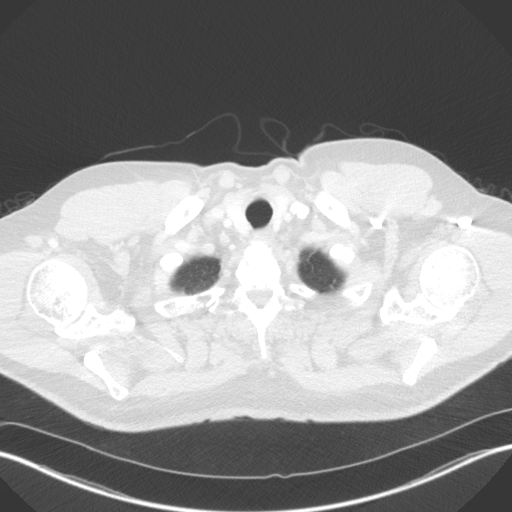

[15 of 34 positions shown; findings below may reference images not displayed]

RADIATION DOSE REDUCTION: This exam was performed according to the
departmental dose-optimization program which includes automated
exposure control, adjustment of the mA and/or kV according to
patient size and/or use of iterative reconstruction technique.

CONTRAST:  75mL CFAWU1-YAA IOPAMIDOL (CFAWU1-YAA) INJECTION 61%
FINDINGS: Cardiovascular: Extensive coronary artery and scattered moderate
aortic calcifications. Heart is normal size. No aortic aneurysm.

Mediastinum/Nodes: Moderate-sized hiatal hernia, stable. No
mediastinal, hilar, or axillary adenopathy. Trachea and esophagus
are unremarkable. Thyroid unremarkable.

Lungs/Pleura: Ground-glass nodular density again noted peripherally
in the left upper lobe on image 53 measuring 9 mm compared to 7 mm
previously. Calcified granuloma in the right upper lobe, unchanged.
No new pulmonary nodules. No effusions. No confluent opacities.

Upper Abdomen: Imaging into the upper abdomen demonstrates no acute
findings.

Musculoskeletal: Chest wall soft tissues are unremarkable. No acute
bony abnormality.
IMPRESSION: Ground-glass nodular density peripherally in the left upper lobe
measures 9 mm compared to 7 mm previously. Given the slight
enlargement, recommend continued close follow-up with repeat CT in
3-6 months.

Coronary artery disease.

Aortic Atherosclerosis ([id number]).

## 2023-07-19 ENCOUNTER — Telehealth: Payer: Self-pay

## 2023-07-19 DIAGNOSIS — Z131 Encounter for screening for diabetes mellitus: Secondary | ICD-10-CM

## 2023-07-19 DIAGNOSIS — E7800 Pure hypercholesterolemia, unspecified: Secondary | ICD-10-CM

## 2023-07-19 DIAGNOSIS — Z79899 Other long term (current) drug therapy: Secondary | ICD-10-CM

## 2023-07-19 DIAGNOSIS — Z125 Encounter for screening for malignant neoplasm of prostate: Secondary | ICD-10-CM

## 2023-07-19 NOTE — Telephone Encounter (Signed)
 Copied from CRM [id number]. Topic: Clinical - Request for Lab/Test Order >> Jul 19, 2023  1:36 PM Florestine Avers wrote: Reason for CRM: Patient has a lab appt scheduled for physical labs on 08/09/23. Please input lab orders in chart.

## 2023-07-22 NOTE — Telephone Encounter (Signed)
 Future lab orders in Epic.

## 2023-07-22 NOTE — Addendum Note (Signed)
 Addended by: Damita Lack on: 07/22/2023 04:19 PM   Modules accepted: Orders

## 2023-08-02 ENCOUNTER — Other Ambulatory Visit: Payer: Self-pay | Admitting: Family Medicine

## 2023-08-07 ENCOUNTER — Other Ambulatory Visit: Payer: Self-pay | Admitting: Family Medicine

## 2023-08-09 ENCOUNTER — Other Ambulatory Visit (INDEPENDENT_AMBULATORY_CARE_PROVIDER_SITE_OTHER): Payer: Medicare PPO

## 2023-08-09 DIAGNOSIS — E7800 Pure hypercholesterolemia, unspecified: Secondary | ICD-10-CM

## 2023-08-09 DIAGNOSIS — Z131 Encounter for screening for diabetes mellitus: Secondary | ICD-10-CM

## 2023-08-09 DIAGNOSIS — Z79899 Other long term (current) drug therapy: Secondary | ICD-10-CM

## 2023-08-09 DIAGNOSIS — Z125 Encounter for screening for malignant neoplasm of prostate: Secondary | ICD-10-CM

## 2023-08-09 LAB — CBC WITH DIFFERENTIAL/PLATELET
Basophils Absolute: 0 10*3/uL (ref 0.0–0.1)
Basophils Relative: 0.6 % (ref 0.0–3.0)
Eosinophils Absolute: 0.1 10*3/uL (ref 0.0–0.7)
Eosinophils Relative: 2.4 % (ref 0.0–5.0)
HCT: 43.8 % (ref 39.0–52.0)
Hemoglobin: 14.7 g/dL (ref 13.0–17.0)
Lymphocytes Relative: 29.6 % (ref 12.0–46.0)
Lymphs Abs: 1.3 10*3/uL (ref 0.7–4.0)
MCHC: 33.6 g/dL (ref 30.0–36.0)
MCV: 96.3 fl (ref 78.0–100.0)
Monocytes Absolute: 0.4 10*3/uL (ref 0.1–1.0)
Monocytes Relative: 9.2 % (ref 3.0–12.0)
Neutro Abs: 2.6 10*3/uL (ref 1.4–7.7)
Neutrophils Relative %: 58.2 % (ref 43.0–77.0)
Platelets: 193 10*3/uL (ref 150.0–400.0)
RBC: 4.55 Mil/uL (ref 4.22–5.81)
RDW: 13.9 % (ref 11.5–15.5)
WBC: 4.4 10*3/uL (ref 4.0–10.5)

## 2023-08-09 LAB — BASIC METABOLIC PANEL WITH GFR
BUN: 21 mg/dL (ref 6–23)
CO2: 28 meq/L (ref 19–32)
Calcium: 9.1 mg/dL (ref 8.4–10.5)
Chloride: 106 meq/L (ref 96–112)
Creatinine, Ser: 0.82 mg/dL (ref 0.40–1.50)
GFR: 91.33 mL/min (ref >60.00)
Glucose, Bld: 101 mg/dL — ABNORMAL HIGH (ref 70–99)
Potassium: 4.3 meq/L (ref 3.5–5.1)
Sodium: 140 meq/L (ref 135–145)

## 2023-08-09 LAB — LIPID PANEL
Cholesterol: 155 mg/dL (ref 0–200)
HDL: 53.7 mg/dL (ref >39.00)
LDL Cholesterol: 81 mg/dL (ref 0–99)
NonHDL: 101.48
Total CHOL/HDL Ratio: 3
Triglycerides: 102 mg/dL (ref 0.0–149.0)
VLDL: 20.4 mg/dL (ref 0.0–40.0)

## 2023-08-09 LAB — HEPATIC FUNCTION PANEL
ALT: 23 U/L (ref 0–53)
AST: 19 U/L (ref 0–37)
Albumin: 4.4 g/dL (ref 3.5–5.2)
Alkaline Phosphatase: 73 U/L (ref 39–117)
Bilirubin, Direct: 0.1 mg/dL (ref 0.0–0.3)
Total Bilirubin: 0.3 mg/dL (ref 0.2–1.2)
Total Protein: 7 g/dL (ref 6.0–8.3)

## 2023-08-09 LAB — HEMOGLOBIN A1C: Hgb A1c MFr Bld: 5.8 % (ref 4.6–6.5)

## 2023-08-09 LAB — PSA, MEDICARE: PSA: 0.61 ng/mL (ref 0.10–4.00)

## 2023-08-15 NOTE — Progress Notes (Unsigned)
 Joshua Guinta T. Mayda Shippee, MD, CAQ [facility] [address] [place], [address]  Phone: [phone]  FAX: [phone]  [name] - 68 y.o. male  MRN [id number]  Date of Birth: [birth year]  Date: 08/16/2023  PCP: Joshua Beat, MD  Referral: Joshua Beat, MD  No chief complaint on file.  Patient Care Team: Joshua Beat, MD as PCP - General Lang, Joshua Pew, MD (General Surgery) Joshua Odea, MD as Consulting Physician (Cardiology) Joshua Lang) Subjective:   Joshua Lang is a 68 y.o. pleasant patient who presents with the following:  Preventative Health Maintenance Visit:  Health Maintenance Summary Reviewed and updated, unless pt declines services.  Tobacco History Reviewed. Alcohol: No concerns, no excessive use Exercise Habits: Some activity, rec at least 30 mins 5 times a week STD concerns: no risk or activity to increase risk Drug Use: None  Joshua Lang is a very well-known patient, who I have known for many years.  Health maintenance: COVID booster RSV  He is a generally healthy patient.  Does take Lipitor 40 mg.  He does have three-vessel coronary disease with preserved EF, cardiology recommends continued Lipitor 40 mg and 81 mg aspirin.    Health Maintenance  Topic Date Due   COVID-19 Vaccine (5 - 2024-25 season) 01/10/2023   INFLUENZA VACCINE  12/10/2023   Medicare Annual Wellness (AWV)  06/24/2024   Colonoscopy  12/21/2024   DTaP/Tdap/Td (3 - Td or Tdap) 12/31/2025   Pneumonia Vaccine 81+ Years old  Completed   Hepatitis C Screening  Completed   Zoster Vaccines- Shingrix  Completed   HPV VACCINES  Aged Out   Immunization History  Administered Date(s) Administered   Fluad Quad(high Dose 65+) 02/02/2022   Influenza Whole 02/08/2009, 02/07/2012   Influenza, Seasonal, Injecte, Preservative Fre 01/08/2014   Influenza,inj,Quad PF,6+ Mos 01/09/2015, 01/01/2016,  01/14/2017, 02/03/2018, 01/10/2019, 04/23/2020, 01/21/2021   PFIZER(Purple Top)SARS-COV-2 Vaccination 07/27/2019, 08/17/2019, 04/26/2020   PNEUMOCOCCAL CONJUGATE-20 08/13/2022   Pfizer(Comirnaty)Fall Seasonal Vaccine 12 years and older 07/10/2022   Pneumococcal Conjugate-13 04/23/2015   Pneumococcal Polysaccharide-23 03/28/2016   Td 02/13/2009   Tdap 01/01/2016   Zoster Recombinant(Shingrix) 01/14/2017, 03/23/2017   Zoster, Live 06/22/2013   Patient Active Problem List   Diagnosis Date Noted   Coronary artery disease involving native coronary artery of native heart without angina pectoris 05/18/2021    Priority: High   Sleep apnea 02/13/2009    Priority: High   HYPERCHOLESTEROLEMIA 10/30/2008    Priority: High   NEPHROLITHIASIS 10/30/2008    Priority: Medium    Benign prostatic hyperplasia without lower urinary tract symptoms 03/10/2017    Priority: Low   Hernia of abdominal cavity 01/23/2016    Priority: Low   Allergic rhinitis 02/17/2009    Priority: Low   S/P total hip arthroplasty 05/20/2015   ERECTILE DYSFUNCTION, 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    Past Medical History:  Diagnosis Date   Allergic rhinitis    Colonic polyp    Complication of anesthesia    WITH 1ST HERNIA SURGERY PT STATES HE WAS CHOKING WITH TUBE IN   ED (erectile dysfunction)    Enlarged prostate    Moderately   GERD (gastroesophageal reflux disease)    History of kidney stones    H/O   Hypercholesteremia     Past Surgical History:  Procedure Laterality Date   COLONOSCOPY  2015   HERNIA REPAIR Left 1990's   X2   INGUINAL HERNIA REPAIR Right 03/24/2016  Procedure: HERNIA REPAIR INGUINAL ADULT;  Surgeon: Earline Mayotte, MD;  Location: [facility] ORS;  Service: General;  Laterality: Right;   JOINT REPLACEMENT Bilateral    THR   SHOULDER ARTHROSCOPY WITH OPEN ROTATOR CUFF REPAIR Right 03/22/2018   Procedure: SHOULDER ARTHROSCOPY WITH OPEN ROTATOR CUFF REPAIR;  Surgeon: Christena Flake, MD;  Location: [facility] ORS;   Service: Orthopedics;  Laterality: Right;   SHOULDER OPEN ROTATOR CUFF REPAIR  2014   Hooten, partial RTC tear with probably SAD, possible DCE   SHOULDER SURGERY  2009   open, 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, (Jim Hooten)   TOTAL HIP ARTHROPLASTY  09/2009   (Hooten)   TOTAL HIP ARTHROPLASTY Left 05/20/2015   Procedure: TOTAL HIP ARTHROPLASTY;  Surgeon: Donato Heinz, MD;  Location: [facility] ORS;  Service: Orthopedics;  Laterality: Left;    Family History  Problem Relation Age of Onset   Prostate cancer Father    Migraines Sister     Social History   Social History Narrative   ** Merged History Encounter **        Past Medical History, Surgical History, Social History, Family History, Problem List, Medications, and Allergies have been reviewed and updated if relevant.  Review of Systems: Pertinent positives are listed above.  Otherwise, a full 14 point review of systems has been done in full and it is negative except where it is noted positive.  Objective:   There were no vitals taken for this visit. Ideal Body Weight:    Ideal Body Weight:   No results found.    06/25/2023    3:22 PM 11/23/2022   10:34 AM 08/13/2022   10:47 AM 05/29/2022    2:17 PM 07/30/2021   11:26 AM  Depression screen PHQ 2/9  Decreased Interest 0 0 0 1 0  Down, Depressed, Hopeless 0 0 0 0 0  PHQ - 2 Score 0 0 0 1 0     GEN: well developed, well nourished, no acute distress Eyes: conjunctiva and lids normal, PERRLA, EOMI ENT: TM clear, nares clear, oral exam WNL Neck: supple, no lymphadenopathy, no thyromegaly, no JVD Pulm: clear to auscultation and percussion, respiratory effort normal CV: regular rate and rhythm, S1-S2, no murmur, rub or gallop, no bruits, peripheral pulses normal and symmetric, no cyanosis, clubbing, edema or varicosities GI: soft, non-tender; no hepatosplenomegaly, masses; active bowel sounds all quadrants GU: deferred Lymph: no cervical, axillary or inguinal adenopathy MSK: gait normal,  muscle tone and strength WNL, no joint swelling, effusions, discoloration, crepitus  SKIN: clear, good turgor, color WNL, no rashes, lesions, or ulcerations Neuro: normal mental status, normal strength, sensation, and motion Psych: alert; oriented to person, place and time, normally interactive and not anxious or depressed in appearance.  All labs reviewed with patient. Results for orders placed or performed in visit on 08/09/23  PSA, Medicare   Collection Time: 08/09/23  9:36 AM  Result Value Ref Range   PSA 0.61 0.10 - 4.00 ng/ml  Hemoglobin A1c   Collection Time: 08/09/23  9:36 AM  Result Value Ref Range   Hgb A1c MFr Bld 5.8 4.6 - 6.5 %  Hepatic Function Panel   Collection Time: 08/09/23  9:36 AM  Result Value Ref Range   Total Bilirubin 0.3 0.2 - 1.2 mg/dL   Bilirubin, Direct 0.1 0.0 - 0.3 mg/dL   Alkaline Phosphatase 73 39 - 117 U/L   AST 19 0 - 37 U/L   ALT 23 0 - 53 U/L   Total  Protein 7.0 6.0 - 8.3 g/dL   Albumin 4.4 3.5 - 5.2 g/dL  Basic metabolic panel   Collection Time: 08/09/23  9:36 AM  Result Value Ref Range   Sodium 140 135 - 145 mEq/L   Potassium 4.3 3.5 - 5.1 mEq/L   Chloride 106 96 - 112 mEq/L   CO2 28 19 - 32 mEq/L   Glucose, Bld 101 (H) 70 - 99 mg/dL   BUN 21 6 - 23 mg/dL   Creatinine, Ser 2.95 0.40 - 1.50 mg/dL   GFR 28.41 >32.44 mL/min   Calcium 9.1 8.4 - 10.5 mg/dL  Lipid panel   Collection Time: 08/09/23  9:36 AM  Result Value Ref Range   Cholesterol 155 0 - 200 mg/dL   Triglycerides 010.2 0.0 - 149.0 mg/dL   HDL 72.53 >66.44 mg/dL   VLDL 03.4 0.0 - 74.2 mg/dL   LDL Cholesterol 81 0 - 99 mg/dL   Total CHOL/HDL Ratio 3    NonHDL 101.48   CBC with Differential/Platelet   Collection Time: 08/09/23  9:36 AM  Result Value Ref Range   WBC 4.4 4.0 - 10.5 K/uL   RBC 4.55 4.22 - 5.81 Mil/uL   Hemoglobin 14.7 13.0 - 17.0 g/dL   HCT 59.5 63.8 - 75.6 %   MCV 96.3 78.0 - 100.0 fl   MCHC 33.6 30.0 - 36.0 g/dL   RDW 43.3 29.5 - 18.8 %   Platelets  193.0 150.0 - 400.0 K/uL   Neutrophils Relative % 58.2 43.0 - 77.0 %   Lymphocytes Relative 29.6 12.0 - 46.0 %   Monocytes Relative 9.2 3.0 - 12.0 %   Eosinophils Relative 2.4 0.0 - 5.0 %   Basophils Relative 0.6 0.0 - 3.0 %   Neutro Abs 2.6 1.4 - 7.7 K/uL   Lymphs Abs 1.3 0.7 - 4.0 K/uL   Monocytes Absolute 0.4 0.1 - 1.0 K/uL   Eosinophils Absolute 0.1 0.0 - 0.7 K/uL   Basophils Absolute 0.0 0.0 - 0.1 K/uL    Assessment and Plan:     ICD-10-CM   1. Healthcare maintenance  Z00.00       Health Maintenance Exam: The patient's preventative maintenance and recommended screening tests for an annual wellness exam were reviewed in full today. Brought up to date unless services declined.  Counselled on the importance of diet, exercise, and its role in overall health and mortality. The patient's FH and SH was reviewed, including their home life, tobacco status, and drug and alcohol status.  Follow-up in 1 year for physical exam or additional follow-up below.  Disposition: No follow-ups on file.  No orders of the defined types were placed in this encounter.  There are no discontinued medications. No orders of the defined types were placed in this encounter.   Signed,  Elpidio Galea. Prestyn Stanco, MD   Allergies as of 08/16/2023       Reactions   Prevnar 20 [pneumococcal 20-val Conj Vacc]    Achy and sick feeling    Terbinafine And Related    Trouble sleeping, depression & fatigue   Tramadol Itching   Antihistamines, Chlorpheniramine-type Other (See Comments)   Urinary frequency/urgency.   Oxycodone Itching   And hyper feeling/mind races "Hyper feeling"        Medication List        Accurate as of August 15, 2023  2:04 PM. If you have any questions, ask your nurse or doctor.          aspirin 81  MG chewable tablet Chew 81 mg by mouth at bedtime.   atorvastatin 40 MG tablet Commonly known as: LIPITOR TAKE 1 TABLET(40 MG) BY MOUTH DAILY   B-complex with vitamin C  tablet Take 1 tablet by mouth daily.   Cranberry 500 MG Tabs Take 1 tablet by mouth in the morning and at bedtime.   esomeprazole 40 MG capsule Commonly known as: NEXIUM TAKE 1 CAPSULE(40 MG) BY MOUTH DAILY   fluocinonide cream 0.05 % Commonly known as: LIDEX Apply bid as directed   K2 PO Take 1 tablet by mouth daily.   MAG-G PO Take by mouth.   multivitamin with minerals Tabs tablet Take 1 tablet by mouth 2 (two) times daily.   Omega-3 1000 MG Caps Take 1 capsule by mouth daily.   Saw Palmetto 450 MG Caps Take 450 mg by mouth 2 (two) times daily.   sildenafil 100 MG tablet Commonly known as: VIAGRA Take 1 tablet (100 mg total) by mouth daily as needed for erectile dysfunction (take 30 minutes prior to sexual activity).   tamsulosin 0.4 MG Caps capsule Commonly known as: FLOMAX TAKE 1 CAPSULE(0.4 MG) BY MOUTH DAILY   vitamin C with rose hips 500 MG tablet Take 500 mg by mouth daily.   Vitamin D3 50 MCG (2000 UT) Tabs Take 1 tablet by mouth daily.

## 2023-08-16 ENCOUNTER — Encounter: Payer: Self-pay | Admitting: Family Medicine

## 2023-08-16 ENCOUNTER — Ambulatory Visit (INDEPENDENT_AMBULATORY_CARE_PROVIDER_SITE_OTHER): Payer: Medicare PPO | Admitting: Family Medicine

## 2023-08-16 VITALS — BP 120/80 | HR 58 | Temp 97.5°F | Ht 72.5 in | Wt 226.5 lb

## 2023-08-16 DIAGNOSIS — Z0000 Encounter for general adult medical examination without abnormal findings: Secondary | ICD-10-CM | POA: Diagnosis not present

## 2023-08-16 DIAGNOSIS — R7303 Prediabetes: Secondary | ICD-10-CM | POA: Diagnosis not present

## 2023-08-16 MED ORDER — SILDENAFIL CITRATE 100 MG PO TABS: 100.0000 mg | ORAL_TABLET | Freq: Every day | ORAL | 6 refills | Status: DC | PRN

## 2023-08-16 MED ORDER — TAMSULOSIN HCL 0.4 MG PO CAPS: 0.4000 mg | ORAL_CAPSULE | Freq: Every day | ORAL | 3 refills | Status: AC

## 2023-08-16 MED ORDER — ATORVASTATIN CALCIUM 40 MG PO TABS: 40.0000 mg | ORAL_TABLET | Freq: Every day | ORAL | 3 refills | Status: DC

## 2023-08-16 MED ORDER — ESOMEPRAZOLE MAGNESIUM 40 MG PO CPDR: DELAYED_RELEASE_CAPSULE | ORAL | 3 refills | Status: AC

## 2023-08-17 ENCOUNTER — Telehealth: Payer: Self-pay | Admitting: Family Medicine

## 2023-08-17 ENCOUNTER — Encounter: Payer: Self-pay | Admitting: Family Medicine

## 2023-08-17 DIAGNOSIS — R7303 Prediabetes: Secondary | ICD-10-CM | POA: Insufficient documentation

## 2023-08-17 NOTE — Telephone Encounter (Signed)
 Copied from CRM [id number]. Topic: General - Other >> Aug 17, 2023 10:15 AM Saverio Danker wrote: Reason for CRM: Patient is calling in to let DR Copland know that he received his 4th covid shot 08/16/23 at [facility]   He also wants to let the Dr know that he found the information for his RSV shot. He received that shot on 09/04/2022 at [facility]   Can disregard anything from [facility]

## 2023-08-17 NOTE — Telephone Encounter (Signed)
 Noted.  Vaccine record updated.  [facility] had already notified of Moderna Covid Vaccine and I was able to find RSV in [facility].   PA department notified by fax to disregard PA request for [facility] on RSV vaccine.

## 2023-08-18 ENCOUNTER — Other Ambulatory Visit (HOSPITAL_COMMUNITY): Payer: Self-pay

## 2023-08-18 ENCOUNTER — Telehealth: Payer: Self-pay

## 2023-08-18 NOTE — Telephone Encounter (Signed)
 Pharmacy Patient Advocate Encounter   Received notification from Fax that prior authorization for Abrysvo (vaccine) is required/requested.   Insurance verification completed.   The patient is insured through [employer] .   Per test claim: PA required; PA submitted to above mentioned insurance via Phone Key/confirmation #/EOC [id number] Status is pending

## 2023-09-22 ENCOUNTER — Other Ambulatory Visit (HOSPITAL_COMMUNITY): Payer: Self-pay

## 2023-09-22 NOTE — Telephone Encounter (Signed)
 Pharmacy Patient Advocate Encounter  Received notification from [employer] that Prior Authorization for Abrysvo (vaccine) has been DENIED.  Full denial letter will be uploaded to the media tab. See denial reason below. Arexvy was done at [facility] on 06/16/2022. See sheet in Media tab.    PA #/Case ID/Reference #: [id number]

## 2023-10-07 ENCOUNTER — Ambulatory Visit: Admitting: Physician Assistant

## 2023-10-07 DIAGNOSIS — R350 Frequency of micturition: Secondary | ICD-10-CM

## 2023-10-07 DIAGNOSIS — N390 Urinary tract infection, site not specified: Secondary | ICD-10-CM

## 2023-10-07 LAB — URINALYSIS, COMPLETE
Bilirubin, UA: NEGATIVE
Glucose, UA: NEGATIVE
Ketones, UA: NEGATIVE
Leukocytes,UA: NEGATIVE
Nitrite, UA: NEGATIVE
Protein,UA: NEGATIVE
RBC, UA: NEGATIVE
Specific Gravity, UA: <1.005 — ABNORMAL LOW (ref 1.005–1.030)
Urobilinogen, Ur: 0.2 mg/dL (ref 0.2–1.0)
pH, UA: 6 (ref 5.0–7.5)

## 2023-10-07 LAB — MICROSCOPIC EXAMINATION
Bacteria, UA: NONE SEEN
Epithelial Cells (non renal): NONE SEEN /HPF (ref 0–10)
WBC, UA: NONE SEEN /HPF (ref 0–5)

## 2023-10-07 LAB — BLADDER SCAN AMB NON-IMAGING

## 2023-10-07 MED ORDER — TAMSULOSIN HCL 0.4 MG PO CAPS
0.8000 mg | ORAL_CAPSULE | Freq: Every day | ORAL | 0 refills | Status: DC
Start: 2023-10-07 — End: 2024-02-23

## 2023-10-07 MED ORDER — GEMTESA 75 MG PO TABS: 75.0000 mg | ORAL_TABLET | Freq: Every day | ORAL | Status: DC

## 2023-10-07 NOTE — Progress Notes (Signed)
 10/07/2023 1:24 PM   Joshua Lang 1955-06-11 [id number]  CC: Chief Complaint  Patient presents with   Urinary Frequency   HPI: Joshua Lang is a 68 y.o. 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   Today he reports several weeks of increased urgency, frequency, and nocturia x6-8. He denies weak stream, perineal discomfort, or straining, but describes a sensation of incomplete bladder emptying and slight irritation in the bladder area. Notably, he reports he's been under a lot of stress lately with his elderly [place]  In-office UA and microscopy pan-negative. PVR 0mL.  PMH: Past Medical History:  Diagnosis Date   Allergic rhinitis    BPH with urinary obstruction    Colonic polyp    Coronary artery disease involving native coronary artery of native heart without angina pectoris 05/18/2021   ED (erectile dysfunction)    GERD (gastroesophageal reflux disease)    History of kidney stones    Hypercholesteremia    Sleep apnea 02/13/2009    Surgical History: Past Surgical History:  Procedure Laterality Date   HERNIA REPAIR Left 1990's   X2   INGUINAL HERNIA REPAIR Right 03/24/2016   Procedure: HERNIA REPAIR INGUINAL ADULT;  Surgeon: Marshall Skeeter, MD;  Location: [facility] OR[facility];  Service: General;  Laterality: Right;   SHOULDER ARTHROSCOPY WITH OPEN ROTATOR CUFF REPAIR Right 03/22/2018   Procedure: SHOULDER ARTHROSCOPY WITH OPEN ROTATOR CUFF REPAIR;  Surgeon: Elner Hahn, MD;  Location: [facility] OR[facility];  Service: Orthopedics;  Laterality: Right;   SHOULDER OPEN ROTATOR CUFF REPAIR  2014   Hooten, partial RTC tear with probably SAD, possible DCE   SHOULDER SURGERY  2009   open, 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, (Jim Hooten)   TOTAL HIP ARTHROPLASTY Right 09/2009   (Hooten)   TOTAL HIP ARTHROPLASTY Left 05/20/2015   Procedure: TOTAL HIP ARTHROPLASTY;   Surgeon: Arlyne Lame, MD;  Location: [facility] OR[facility];  Service: Orthopedics;  Laterality: Left;    Home Medications:  Allergies as of 10/07/2023       Reactions   Prevnar 20 [pneumococcal 20-val Conj Vacc]    Achy and sick feeling    Terbinafine  And Related    Trouble sleeping, depression & fatigue   Tramadol  Itching   Antihistamines, Chlorpheniramine-type Other (See Comments)   Urinary frequency/urgency.   Oxycodone Itching   And hyper feeling/mind races "Hyper feeling"        Medication List        Accurate as of Oct 07, 2023  1:24 PM. If you have any questions, ask your nurse or doctor.          aspirin 81 MG chewable tablet Chew 81 mg by mouth at bedtime.   atorvastatin  40 MG tablet Commonly known as: LIPITOR Take 1 tablet (40 mg total) by mouth daily.   B-complex with vitamin C  tablet Take 1 tablet by mouth daily.   Cranberry 500 MG Tabs Take 1 tablet by mouth in the morning and at bedtime.   esomeprazole  40 MG capsule Commonly known as: NEXIUM  TAKE 1 CAPSULE(40 MG) BY MOUTH DAILY   fluocinonide  cream 0.05 % Commonly known as: LIDEX  Apply bid as directed   K2 PO Take 1 tablet by mouth daily.   MAG-G PO Take by mouth.   Omega-3 1000 MG Caps Take 1 capsule by mouth daily.   Saw Palmetto  450 MG Caps Take 450  mg by mouth 2 (two) times daily.   sildenafil  100 MG tablet Commonly known as: VIAGRA  Take 1 tablet (100 mg total) by mouth daily as needed for erectile dysfunction (take 30 minutes prior to sexual activity).   tamsulosin  0.4 MG Caps capsule Commonly known as: FLOMAX  Take 1 capsule (0.4 mg total) by mouth daily.   Turmeric 09-998 MG Caps Take by mouth.   vitamin C  with rose hips 500 MG tablet Take 500 mg by mouth daily.   Vitamin D3 50 MCG (2000 UT) Tabs Take 1 tablet by mouth daily.        Allergies:  Allergies  Allergen Reactions   Prevnar 20 [Pneumococcal 20-Val Conj Vacc]     Achy and sick feeling    Terbinafine  And  Related     Trouble sleeping, depression & fatigue   Tramadol  Itching   Antihistamines, Chlorpheniramine-Type Other (See Comments)    Urinary frequency/urgency.    Oxycodone Itching    And hyper feeling/mind races "Hyper feeling"     Family History: Family History  Problem Relation Age of Onset   Prostate cancer Father    Migraines Sister     Social History:   reports that he has never smoked. He has never been exposed to tobacco smoke. He has never used smokeless tobacco. He reports current alcohol use. He reports that he does not use drugs.  Physical Exam: There were no vitals taken for this visit.  Constitutional:  Alert and oriented, no acute distress, nontoxic appearing HEENT: [place], AT Cardiovascular: No clubbing, cyanosis, or edema Respiratory: Normal respiratory effort, no increased work of breathing Skin: No rashes, bruises or suspicious lesions Neurologic: Grossly intact, no focal deficits, moving all 4 extremities Psychiatric: Normal mood and affect  Laboratory Data: Results for orders placed or performed in visit on 10/07/23  Microscopic Examination   Collection Time: 10/07/23  1:05 PM   Urine  Result Value Ref Range   WBC, UA None seen 0 - 5 /hpf   RBC, Urine 0-2 0 - 2 /hpf   Epithelial Cells (non renal) None seen 0 - 10 /hpf   Bacteria, UA None seen None seen/Few  Urinalysis, Complete   Collection Time: 10/07/23  1:05 PM  Result Value Ref Range   Specific Gravity, UA <1.005 (L) 1.005 - 1.030   pH, UA 6.0 5.0 - 7.5   Color, UA Yellow Yellow   Appearance Ur Clear Clear   Leukocytes,UA Negative Negative   Protein,UA Negative Negative/Trace   Glucose, UA Negative Negative   Ketones, UA Negative Negative   RBC, UA Negative Negative   Bilirubin, UA Negative Negative   Urobilinogen, Ur 0.2 0.2 - 1.0 mg/dL   Nitrite, UA Negative Negative   Microscopic Examination Comment    Microscopic Examination See below:   BLADDER SCAN AMB NON-IMAGING   Collection  Time: 10/07/23  1:23 PM  Result Value Ref Range   Scan Result 0ml    Assessment & Plan:   1. Urinary frequency (Primary) UA bland and he's emptying appropriately. With his history of bacterial prostatitis, will send for culture and treat as indicated. In the meantime with bland UA, will increase Flomax  to BID and start Gemtesa x4 weeks for more inflammatory prostatitis vs IC picture. - BLADDER SCAN AMB NON-IMAGING - Urinalysis, Complete - tamsulosin  (FLOMAX ) 0.4 MG CAPS capsule; Take 2 capsules (0.8 mg total) by mouth daily.  Dispense: 60 capsule; Refill: 0 - Vibegron (GEMTESA) 75 MG TABS; Take 1 tablet (75 mg total)  by mouth daily. - CULTURE, URINE COMPREHENSIVE   Return for Will call with results.  Kathreen Pare, PA-C  [facility]  [address], [address] [place], [place] [address] [phone]

## 2023-10-12 LAB — CULTURE, URINE COMPREHENSIVE

## 2023-10-13 ENCOUNTER — Ambulatory Visit: Payer: Self-pay | Admitting: Urology

## 2023-10-13 DIAGNOSIS — N390 Urinary tract infection, site not specified: Secondary | ICD-10-CM

## 2023-10-13 MED ORDER — CEFUROXIME AXETIL 250 MG PO TABS: 250.0000 mg | ORAL_TABLET | Freq: Two times a day (BID) | ORAL | 0 refills | Status: AC

## 2023-10-15 ENCOUNTER — Telehealth: Payer: Self-pay | Admitting: Physician Assistant

## 2023-10-15 DIAGNOSIS — Z96643 Presence of artificial hip joint, bilateral: Secondary | ICD-10-CM | POA: Diagnosis not present

## 2023-10-15 NOTE — Telephone Encounter (Signed)
 Please let him know there are Gemtesa  samples at the front desk for him.

## 2023-10-15 NOTE — Telephone Encounter (Signed)
 Pt states the gemtesa  was giving him too many side effects, states he didn't like them. Made him he feel bad, weak, nauseous. States he isn't having urinary frequency anymore since he's been on antibiotics.

## 2023-10-19 ENCOUNTER — Encounter: Payer: Self-pay | Admitting: Physician Assistant

## 2023-12-10 ENCOUNTER — Encounter: Payer: Self-pay | Admitting: Urology

## 2024-02-22 ENCOUNTER — Telehealth: Payer: Self-pay | Admitting: Urology

## 2024-02-22 NOTE — Telephone Encounter (Signed)
 PSA can be checked as we are a urology practice and patient meets criteria.

## 2024-02-22 NOTE — Telephone Encounter (Signed)
 Patient called stating he would like to have his PSA checked tomorrow at his scheduled appointment, due to his age. Patient would like to know if an order can possibly be put in. Please advise.

## 2024-02-23 ENCOUNTER — Ambulatory Visit: Admitting: Urology

## 2024-02-23 VITALS — BP 128/81 | HR 73 | Ht 73.0 in | Wt 220.0 lb

## 2024-02-23 DIAGNOSIS — N400 Enlarged prostate without lower urinary tract symptoms: Secondary | ICD-10-CM | POA: Diagnosis not present

## 2024-02-23 DIAGNOSIS — R350 Frequency of micturition: Secondary | ICD-10-CM

## 2024-02-23 DIAGNOSIS — Z8042 Family history of malignant neoplasm of prostate: Secondary | ICD-10-CM | POA: Diagnosis not present

## 2024-02-23 DIAGNOSIS — N529 Male erectile dysfunction, unspecified: Secondary | ICD-10-CM

## 2024-02-23 DIAGNOSIS — Z125 Encounter for screening for malignant neoplasm of prostate: Secondary | ICD-10-CM | POA: Diagnosis not present

## 2024-02-23 LAB — URINALYSIS, COMPLETE
Bilirubin, UA: NEGATIVE
Glucose, UA: NEGATIVE
Ketones, UA: NEGATIVE
Leukocytes,UA: NEGATIVE
Nitrite, UA: NEGATIVE
Protein,UA: NEGATIVE
RBC, UA: NEGATIVE
Specific Gravity, UA: 1.02 (ref 1.005–1.030)
Urobilinogen, Ur: 0.2 mg/dL (ref 0.2–1.0)
pH, UA: 6 (ref 5.0–7.5)

## 2024-02-23 LAB — MICROSCOPIC EXAMINATION: Bacteria, UA: NONE SEEN

## 2024-02-23 LAB — BLADDER SCAN AMB NON-IMAGING: Scan Result: 0

## 2024-02-23 MED ORDER — TAMSULOSIN HCL 0.4 MG PO CAPS: 0.4000 mg | ORAL_CAPSULE | Freq: Every day | ORAL | 3 refills | Status: AC

## 2024-02-23 MED ORDER — SILDENAFIL CITRATE 100 MG PO TABS: 100.0000 mg | ORAL_TABLET | Freq: Every day | ORAL | 6 refills | Status: AC | PRN

## 2024-02-23 MED ORDER — SILDENAFIL CITRATE 100 MG PO TABS: 100.0000 mg | ORAL_TABLET | Freq: Every day | ORAL | 6 refills | Status: DC | PRN

## 2024-02-23 NOTE — Patient Instructions (Signed)

## 2024-02-23 NOTE — Addendum Note (Signed)
 Addended by: GENITA HARLENE CROME on: 02/23/2024 03:17 PM   Modules accepted: Orders

## 2024-02-23 NOTE — Progress Notes (Signed)
   02/23/2024 1:39 PM   Joshua Lang 02-19-1956 [id number]  Reason for visit: Follow up history of recurrent prostatitis, BPH, PSA screening, ED  History: Family history of prostate cancer, PSA is always been normal Multiple episodes of culture documented prostatitis, have decreased on cranberry tablet prophylaxis ED well-controlled on sildenafil  BPH well-controlled with Flomax   Physical Exam: BP 128/81   Pulse 73   Ht 6' 1 (1.854 m)   Wt 220 lb (99.8 kg)   BMI 29.03 kg/m   Imaging/labs: PSA March 2025 normal at 0.7, stable from prior values Urinalysis today benign  Today: E. coli infection treated in May 2025 with antibiotics He thinks he had another infection early October, was on amoxicillin  from dentist for 10 days and symptoms have resolved PVR today normal at 0ml No urinary symptoms today, continues on Flomax  ED well-controlled on sildenafil   Plan:   PSA screening: Reassurance provided regarding normal and stable values, did not recommend screening today with recent possible UTI and discussed false positive PSA.  Can repeat in March 2026 with PCP ED: Well-controlled on sildenafil , refilled BPH: Well-controlled on Flomax , refilled Recurrent prostatitis: Recommended continuing cranberry tablet prophylaxis, reassurance provided regarding benign urinalysis today, he requested urine be sent for culture as well RTC 1 year PVR, medications refilled   Joshua JAYSON Burnet, MD  [facility] [address], [address] [place], [place] [address] [phone]

## 2024-02-24 ENCOUNTER — Ambulatory Visit: Payer: Self-pay | Admitting: Urology

## 2024-03-02 ENCOUNTER — Ambulatory Visit: Payer: Self-pay | Admitting: Urology

## 2024-03-02 DIAGNOSIS — R8271 Bacteriuria: Secondary | ICD-10-CM

## 2024-03-02 DIAGNOSIS — Z87438 Personal history of other diseases of male genital organs: Secondary | ICD-10-CM

## 2024-03-02 LAB — CULTURE, URINE COMPREHENSIVE

## 2024-03-02 MED ORDER — CIPROFLOXACIN HCL 500 MG PO TABS: 500.0000 mg | ORAL_TABLET | Freq: Two times a day (BID) | ORAL | 0 refills | Status: AC

## 2024-03-02 NOTE — Telephone Encounter (Signed)
Called pt informed him of the information below. Pt voiced understanding. RX sent in.  ?

## 2024-03-02 NOTE — Telephone Encounter (Signed)
-----   Message from Redell JAYSON Burnet sent at 03/02/2024  4:19 PM EDT ----- Urine did grow a very small amount of bacteria.  With his history I think reasonable to treat with Cipro  for 2 weeks to prevent recurrent episode of prostatitis.  Please send Cipro  500 mg twice daily  x 14 days  Redell Burnet, MD 03/02/2024  ----- Message ----- From: Interface, [facility] Lab Results In Sent: 02/23/2024   4:36 PM EDT To: Redell JAYSON Burnet, MD

## 2024-05-15 NOTE — Progress Notes (Signed)
 "  Cardiology Office Note    Date:  05/16/2024   ID:  Joshua Lang, DOB 1956/04/19, MRN [id number]  PCP:  Watt Mirza, MD  Cardiologist:  None  Electrophysiologist:  None   Chief Complaint: Follow-up  History of Present Illness:   Joshua Lang is a 69 y.o. male with history of coronary artery disease (three-vessel calcification on chest CT), hyperlipidemia, BPH, GERD, and sleep apnea who presents for follow-up on CAD.    Patient was initially evaluated by Dr. Darliss 05/2021 after referral from PCP for coronary artery calcifications noted on chest CT 04/2021.  He exercised frequently without symptoms of chest pain or shortness of breath.  He was started on high intensity statin therapy and aspirin.  Echo 06/2021 revealed EF 60 to 65% with mild asymmetric LVH, G1 DD, no RWMA, and mild MR.   Patient presents today overall doing well from a cardiac perspective.  He got sick with bronchitis just before Christmas and is still recovering from the illness.  He was prescribed prednisone  and antibiotics by urgent care.  He continues to feel weak and is trying to get back into regular activity.  Prior to getting sick, he was exercising regularly in the gym doing high intensity workouts.  He denies exertional dyspnea and chest pain.  He otherwise feels well without lightheadedness, dizziness, palpitations, and lower extremity swelling.  Labs independently reviewed: 07/2023-Hgb 14.7, HCT 43.8, platelets 193, sodium 140, potassium 4.3, BUN 21, creatinine 0.82, normal LFTs, TC 155, TG 102, HDL 53, LDL 81  Objective   Past Medical History:  Diagnosis Date   Allergic rhinitis    BPH with urinary obstruction    Colonic polyp    Coronary artery disease involving native coronary artery of native heart without angina pectoris 05/18/2021   ED (erectile dysfunction)    GERD (gastroesophageal reflux disease)    History of kidney stones    Hypercholesteremia    Sleep apnea 02/13/2009    Current  Medications: Active Medications[1]  Allergies:   Gemtesa  [vibegron ]; Prevnar 20 [pneumococcal 20-val conj vacc]; Terbinafine  and related; Tramadol ; Antihistamines, chlorpheniramine-type; and Oxycodone   Social History   Socioeconomic History   Marital status: Single    Spouse name: Not on file   Number of children: Not on file   Years of education: Not on file   Highest education level: Not on file  Occupational History   Occupation: [employer], Emergency Planning/management Officer, head    Comment: Doctorate  Tobacco Use   Smoking status: Never    Passive exposure: Never   Smokeless tobacco: Never  Vaping Use   Vaping status: Never Used  Substance and Sexual Activity   Alcohol use: Yes    Comment: occassional beer   Drug use: No   Sexual activity: Not on file  Other Topics Concern   Not on file  Social History Narrative   Retired Professor   Avid exercise, [employer] cardio   Social Drivers of Health   Tobacco Use: Low Risk (05/16/2024)   Patient History    Smoking Tobacco Use: Never    Smokeless Tobacco Use: Never    Passive Exposure: Never  Financial Resource Strain: Low Risk  (04/22/2024)   Received from [facility]   Overall Financial Resource Strain (CARDIA)    Difficulty of Paying Living Expenses: Not very hard  Food Insecurity: No Food Insecurity (04/22/2024)   Received from [facility]   Epic    Within the past 12 months,  you worried that your food would run out before you got the money to buy more.: Never true    Within the past 12 months, the food you bought just didn't last and you didn't have money to get more.: Never true  Transportation Needs: No Transportation Needs (04/22/2024)   Received from [facility] - Transportation    In the past 12 months, has lack of transportation kept you from medical appointments or from getting medications?: No    Lack of Transportation (Non-Medical): No  Physical Activity:  Sufficiently Active (06/25/2023)   Exercise Vital Sign    Days of Exercise per Week: 5 days    Minutes of Exercise per Session: 60 min  Stress: No Stress Concern Present (06/25/2023)   [employer] of [employer]ational [employer] - Occupational Stress Questionnaire    Feeling of Stress : Not at all  Social Connections: Unknown (06/25/2023)   Social Connection and Isolation Panel    Frequency of Communication with Friends and Family: More than three times a week    Frequency of Social Gatherings with Friends and Family: More than three times a week    Attends Religious Services: Never    Database Administrator or Organizations: No    Attends Banker Meetings: Never    Marital Status: Patient declined  Depression (PHQ2-9): Low Risk (08/16/2023)   Depression (PHQ2-9)    PHQ-2 Score: 0  Alcohol Screen: Low Risk (06/25/2023)   Alcohol Screen    Last Alcohol Screening Score (AUDIT): 1  Housing: Low Risk  (04/22/2024)   Received from [facility]   Epic    In the last 12 months, was there a time when you were not able to pay the mortgage or rent on time?: No    In the past 12 months, how many times have you moved where you were living?: 0    At any time in the past 12 months, were you homeless or living in a shelter (including now)?: No  Utilities: Not At Risk (04/22/2024)   Received from [facility]   Epic    In the past 12 months has the electric, gas, oil, or water company threatened to shut off services in your home?: No  Health Literacy: Adequate Health Literacy (06/25/2023)   B1300 Health Literacy    Frequency of need for help with medical instructions: Never     Family History:  The patient's family history includes Migraines in his sister; Prostate cancer in his father.  ROS:   12-point review of systems is negative unless otherwise noted in the HPI.  EKGs/Other Studies Reviewed:    Studies reviewed were summarized above. The  additional studies were reviewed today:  06/2021 2D echo 1. Left ventricular ejection fraction, by estimation, is 60 to 65%. The  left ventricle has normal function. The left ventricle has no regional  wall motion abnormalities. There is mild asymmetric left ventricular  hypertrophy of the septal segment. Left  ventricular diastolic parameters are consistent with Grade I diastolic  dysfunction (impaired relaxation).   2. Right ventricular systolic function is normal. The right ventricular  size is normal. There is normal pulmonary artery systolic pressure. The  estimated right ventricular systolic pressure is 24.2 mmHg.   3. Left atrial size was mildly dilated.   4. The mitral valve is normal in structure. Mild mitral valve  regurgitation. No evidence of mitral stenosis.   5. The aortic valve is  tricuspid. Aortic valve regurgitation is trivial.  No aortic stenosis is present.   6. There is borderline dilatation of the aortic root, measuring 36 mm.   7. The inferior vena cava is normal in size with greater than 50%  respiratory variability, suggesting right atrial pressure of 3 mmHg.   EKG:  EKG personally reviewed by me today EKG Interpretation Date/Time:  Tuesday May 16 2024 14:55:36 EST Ventricular Rate:  67 PR Interval:  168 QRS Duration:  80 QT Interval:  396 QTC Calculation: 418 R Axis:   -25  Text Interpretation: Sinus rhythm with premature atrial complexes Nonspecific T wave abnormality Confirmed by Lorene Sinclair ([id number]) on 05/16/2024 2:57:56 PM  PHYSICAL EXAM:    VS:  BP 116/68 (BP Location: Left Arm, Patient Position: Sitting, Cuff Size: Large)   Pulse 67   Ht 6' (1.829 m)   Wt 228 lb 6.4 oz (103.6 kg)   SpO2 96%   BMI 30.98 kg/m   BMI: Body mass index is 30.98 kg/m.  GEN: Well nourished, well developed in no acute distress NECK: No JVD; No carotid bruits CARDIAC: RRR, no murmurs, rubs, gallops RESPIRATORY:  Clear to auscultation without rales, wheezing or  rhonchi  ABDOMEN: Soft, non-tender, non-distended EXTREMITIES: No edema; No deformity  Wt Readings from Last 3 Encounters:  05/16/24 228 lb 6.4 oz (103.6 kg)  02/23/24 220 lb (99.8 kg)  08/16/23 226 lb 8 oz (102.7 kg)                  ASSESSMENT & PLAN:   Coronary artery disease Hyperlipidemia - Coronary artery calcifications noted on CT chest dating back to 2022.  Patient exercises frequently without exertional chest pain.  Continue primary prevention with aspirin 81 mg daily and atorvastatin  40 mg daily.  Most recent lipid panel 07/2023 with LDL 81, goal less than 70.  Recommend addition of ezetimibe  10 mg daily for further management of cholesterol.   Disposition: Start ezetimibe  10 mg daily.  F/u with Dr. Darliss or an APP in 1 year.   Medication Adjustments/Labs and Tests Ordered: Current medicines are reviewed at length with the patient today.  Concerns regarding medicines are outlined above. Medication changes, Labs and Tests ordered today are summarized above and listed in the Patient Instructions accessible in Encounters.   Bonney Sinclair Lorene, PA-C 05/16/2024 3:18 PM     [facility] -  [address] [place], [place] [address] [phone]      [1]  Current Meds  Medication Sig   Ascorbic Acid (VITAMIN C  WITH ROSE HIPS) 500 MG tablet Take 500 mg by mouth daily.   aspirin 81 MG chewable tablet Chew 81 mg by mouth at bedtime.    B Complex-C (B-COMPLEX WITH VITAMIN C ) tablet Take 1 tablet by mouth daily.   Cholecalciferol (VITAMIN D3) 50 MCG (2000 UT) TABS Take 1 tablet by mouth daily.   Cranberry 500 MG TABS Take 1 tablet by mouth in the morning and at bedtime.   esomeprazole  (NEXIUM ) 40 MG capsule TAKE 1 CAPSULE(40 MG) BY MOUTH DAILY   ezetimibe  (ZETIA ) 10 MG tablet Take 1 tablet (10 mg total) by mouth daily.   fluocinonide  cream (LIDEX ) 0.05 % Apply bid as directed   Magnesium  Gluconate (MAG-G PO) Take by mouth.    Menaquinone-7 (K2 PO) Take 1 tablet by mouth daily.   Omega-3 1000 MG CAPS Take 1 capsule by mouth daily.   Saw Palmetto  450 MG CAPS Take 450 mg  by mouth 2 (two) times daily.    sildenafil  (VIAGRA ) 100 MG tablet Take 1 tablet (100 mg total) by mouth daily as needed for erectile dysfunction (take 30 minutes prior to sexual activity).   tamsulosin  (FLOMAX ) 0.4 MG CAPS capsule Take 1 capsule (0.4 mg total) by mouth daily.   tamsulosin  (FLOMAX ) 0.4 MG CAPS capsule Take 1 capsule (0.4 mg total) by mouth daily.   Turmeric 09-998 MG CAPS Take by mouth.   [DISCONTINUED] atorvastatin  (LIPITOR) 40 MG tablet Take 1 tablet (40 mg total) by mouth daily.   "

## 2024-05-16 ENCOUNTER — Ambulatory Visit: Attending: Physician Assistant | Admitting: Physician Assistant

## 2024-05-16 ENCOUNTER — Encounter: Payer: Self-pay | Admitting: Physician Assistant

## 2024-05-16 VITALS — BP 116/68 | HR 67 | Ht 72.0 in | Wt 228.4 lb

## 2024-05-16 DIAGNOSIS — E785 Hyperlipidemia, unspecified: Secondary | ICD-10-CM | POA: Diagnosis not present

## 2024-05-16 DIAGNOSIS — I2510 Atherosclerotic heart disease of native coronary artery without angina pectoris: Secondary | ICD-10-CM | POA: Diagnosis not present

## 2024-05-16 MED ORDER — EZETIMIBE 10 MG PO TABS: 10.0000 mg | ORAL_TABLET | Freq: Every day | ORAL | 3 refills | Status: AC

## 2024-05-16 MED ORDER — ATORVASTATIN CALCIUM 40 MG PO TABS: 40.0000 mg | ORAL_TABLET | Freq: Every day | ORAL | 3 refills | Status: AC

## 2024-05-16 NOTE — Patient Instructions (Signed)
 Medication Instructions:  Your physician recommends the following medication changes.   START TAKING: Ezetimibe  10 Mg Daily   *If you need a refill on your cardiac medications before your next appointment, please call your pharmacy*  Lab Work: No labs ordered today  If you have labs (blood work) drawn today and your tests are completely normal, you will receive your results only by: MyChart Message (if you have MyChart) OR A paper copy in the mail If you have any lab test that is abnormal or we need to change your treatment, we will call you to review the results.  Testing/Procedures: No test ordered today   Follow-Up: At [facility], you and your health needs are our priority.  As part of our continuing mission to provide you with exceptional heart care, our providers are all part of one team.  This team includes your primary Cardiologist (physician) and Advanced Practice Providers or APPs (Physician Assistants and Nurse Practitioners) who all work together to provide you with the care you need, when you need it.  Your next appointment:   1 year(s)  Provider:   You may see one of the following Advanced Practice Providers on your designated Care Team:   Lonni Meager, NP Lesley Maffucci, PA-C Bernardino Bring, PA-C Cadence [place], PA-C Tylene Lunch, NP Barnie Hila, NP

## 2024-06-28 ENCOUNTER — Ambulatory Visit: Payer: Medicare PPO

## 2025-02-28 ENCOUNTER — Ambulatory Visit: Admitting: Urology
